# Patient Record
Sex: Female | Born: 1935 | Race: White | Hispanic: No | Smoking: Former smoker
Health system: Southern US, Community
[De-identification: ages and names within clinical notes are randomized; demographics above are authoritative.]

## PROBLEM LIST (undated history)

## (undated) ENCOUNTER — Ambulatory Visit: Attending: Hematology & Oncology | Primary: Hematology & Oncology

## (undated) ENCOUNTER — Inpatient Hospital Stay

## (undated) ENCOUNTER — Encounter: Attending: Adult Health | Primary: Adult Health

## (undated) ENCOUNTER — Ambulatory Visit

## (undated) ENCOUNTER — Telehealth: Attending: Internal Medicine | Primary: Internal Medicine

## (undated) ENCOUNTER — Telehealth

## (undated) ENCOUNTER — Encounter

## (undated) ENCOUNTER — Ambulatory Visit: Payer: MEDICARE | Attending: Physical Medicine & Rehabilitation | Primary: Physical Medicine & Rehabilitation

## (undated) ENCOUNTER — Telehealth: Attending: Adult Health | Primary: Adult Health

## (undated) ENCOUNTER — Encounter: Attending: Internal Medicine | Primary: Internal Medicine

## (undated) ENCOUNTER — Ambulatory Visit: Payer: MEDICARE

## (undated) ENCOUNTER — Ambulatory Visit: Payer: MEDICARE | Attending: Internal Medicine | Primary: Internal Medicine

## (undated) ENCOUNTER — Encounter
Attending: Student in an Organized Health Care Education/Training Program | Primary: Student in an Organized Health Care Education/Training Program

## (undated) ENCOUNTER — Institutional Professional Consult (permissible substitution): Payer: MEDICARE | Attending: Internal Medicine | Primary: Internal Medicine

## (undated) ENCOUNTER — Encounter: Attending: Oncology | Primary: Oncology

## (undated) ENCOUNTER — Ambulatory Visit: Payer: MEDICARE | Attending: Adult Health | Primary: Adult Health

## (undated) ENCOUNTER — Telehealth: Attending: Oncology | Primary: Oncology

## (undated) DIAGNOSIS — I719 Aortic aneurysm of unspecified site, without rupture: Secondary | ICD-10-CM

## (undated) DIAGNOSIS — K219 Gastro-esophageal reflux disease without esophagitis: Secondary | ICD-10-CM

## (undated) DIAGNOSIS — R2689 Other abnormalities of gait and mobility: Secondary | ICD-10-CM

## (undated) DIAGNOSIS — E785 Hyperlipidemia, unspecified: Secondary | ICD-10-CM

## (undated) DIAGNOSIS — I10 Essential (primary) hypertension: Secondary | ICD-10-CM

## (undated) DIAGNOSIS — N8110 Cystocele, unspecified: Secondary | ICD-10-CM

## (undated) DIAGNOSIS — H539 Unspecified visual disturbance: Secondary | ICD-10-CM

## (undated) DIAGNOSIS — I2510 Atherosclerotic heart disease of native coronary artery without angina pectoris: Secondary | ICD-10-CM

## (undated) DIAGNOSIS — I714 Abdominal aortic aneurysm, without rupture, unspecified: Secondary | ICD-10-CM

## (undated) DIAGNOSIS — G459 Transient cerebral ischemic attack, unspecified: Secondary | ICD-10-CM

## (undated) HISTORY — PX: DG GALL BLADDER: HXRAD326

## (undated) HISTORY — PX: EYE SURGERY: SHX253

---

## 2004-10-08 ENCOUNTER — Ambulatory Visit: Payer: Self-pay | Admitting: Family Medicine

## 2005-06-15 ENCOUNTER — Other Ambulatory Visit: Payer: Self-pay

## 2005-06-15 ENCOUNTER — Emergency Department: Payer: Self-pay | Admitting: Emergency Medicine

## 2005-07-06 ENCOUNTER — Emergency Department: Payer: Self-pay | Admitting: Emergency Medicine

## 2005-08-05 ENCOUNTER — Other Ambulatory Visit: Payer: Self-pay

## 2005-08-05 ENCOUNTER — Emergency Department: Payer: Self-pay | Admitting: Emergency Medicine

## 2005-08-10 ENCOUNTER — Ambulatory Visit: Payer: Self-pay | Admitting: Neurology

## 2005-09-09 ENCOUNTER — Ambulatory Visit: Payer: Self-pay | Admitting: Cardiology

## 2006-06-29 ENCOUNTER — Ambulatory Visit: Payer: Self-pay | Admitting: Family Medicine

## 2006-07-26 ENCOUNTER — Ambulatory Visit: Payer: Self-pay | Admitting: Family Medicine

## 2006-10-15 ENCOUNTER — Ambulatory Visit: Payer: Self-pay | Admitting: Vascular Surgery

## 2007-01-31 ENCOUNTER — Ambulatory Visit: Payer: Self-pay | Admitting: Family Medicine

## 2007-07-21 IMAGING — CT CT CHEST W/ CM
1 series · 16 of 33 positions shown, 20 images · non-contrast
Comparison: none

REASON FOR EXAM: Chest pain  pt in rm 14
COMMENTS:

[Series 3: soft tissue · axial · 0.75mm/px · z∈[+47,+357]mm · 16 of 68 slices shown, 20 images]
[im 3/68  mediastinal]
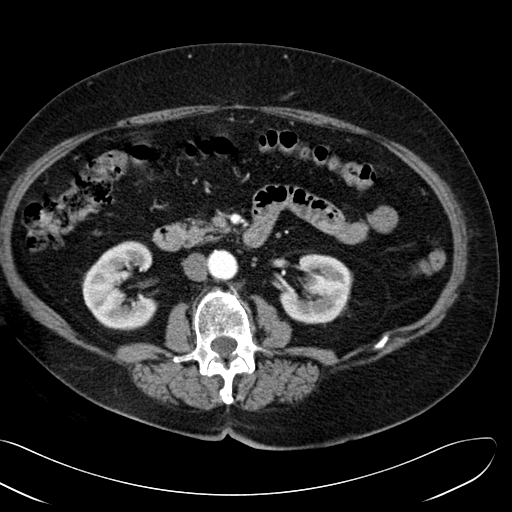
[im 3/68  lung]
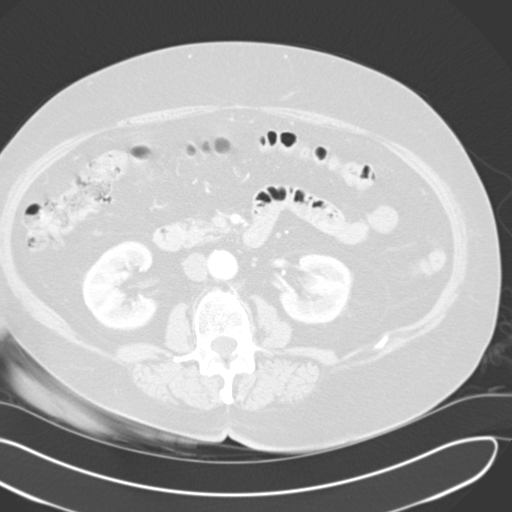
[im 8/68  lung]
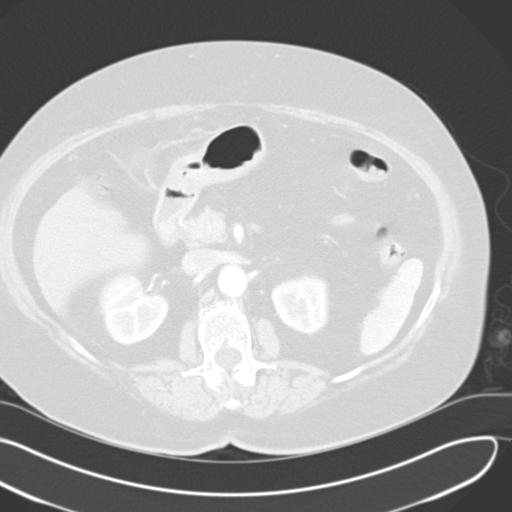
[im 13/68  lung]
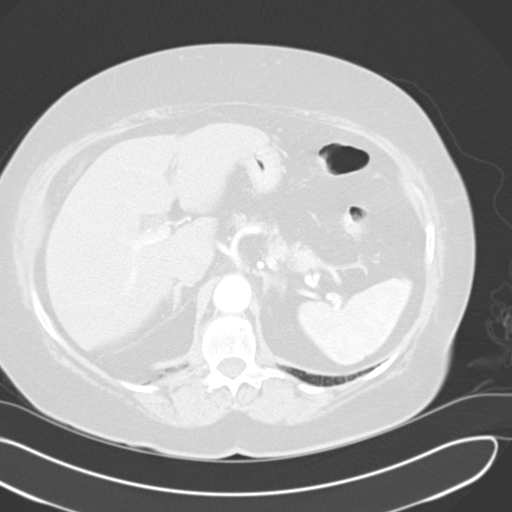
[im 15/68  lung]
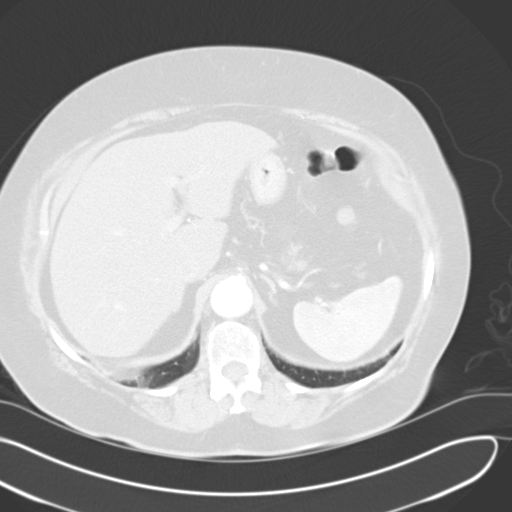
[im 20/68  mediastinal]
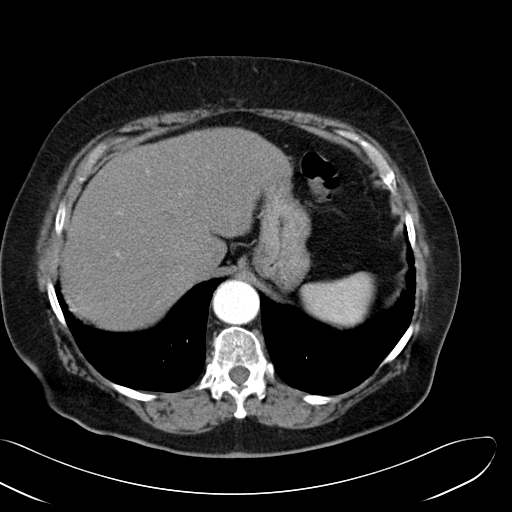
[im 20/68  lung]
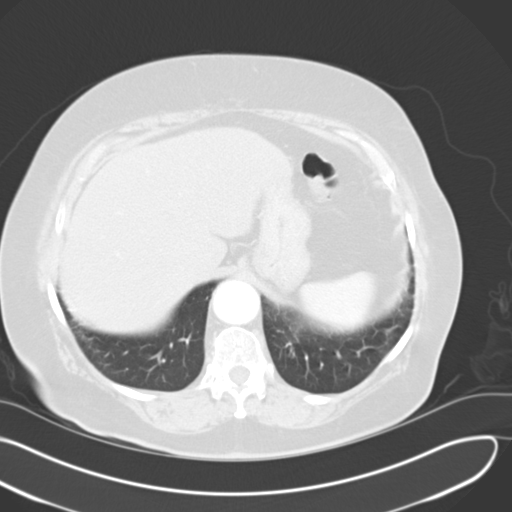
[im 25/68  lung]
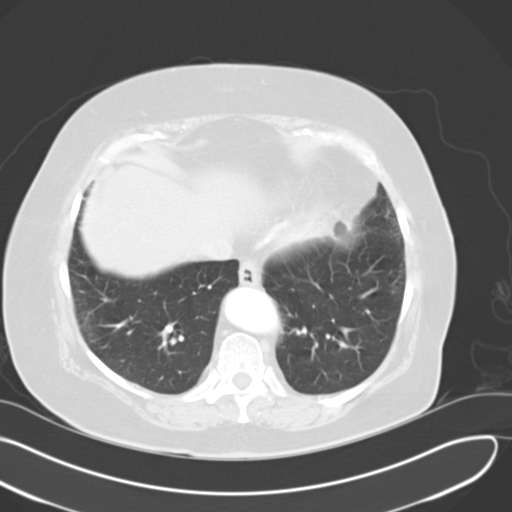
[im 28/68  lung]
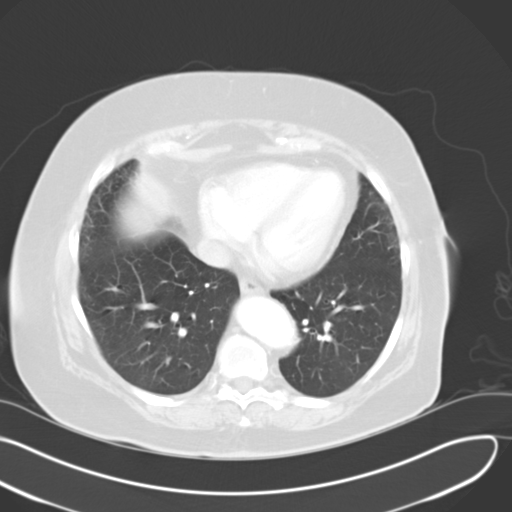
[im 33/68  lung]
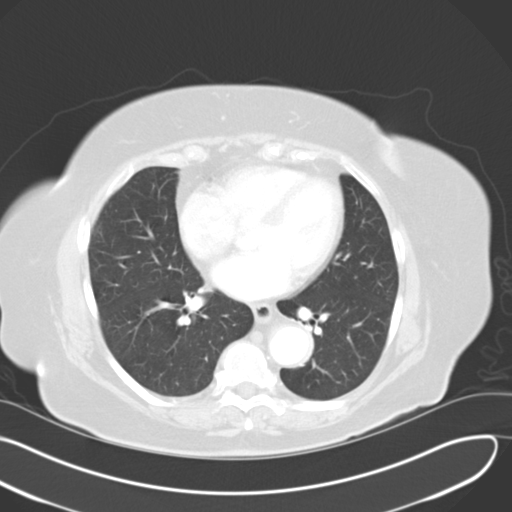
[im 36/68  mediastinal]
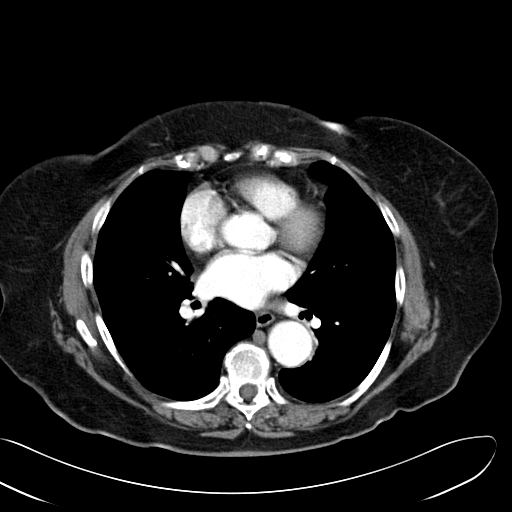
[im 36/68  lung]
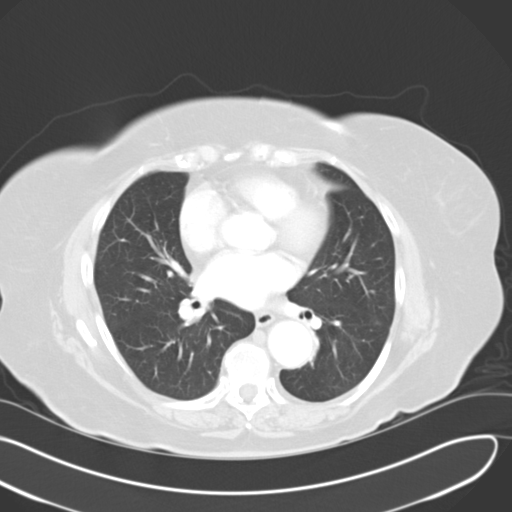
[im 40/68  lung]
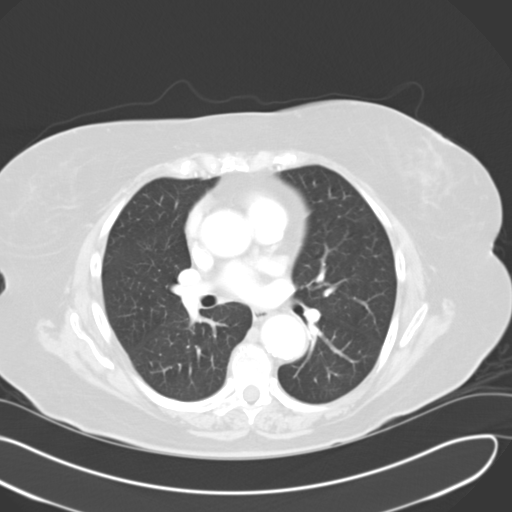
[im 43/68  lung]
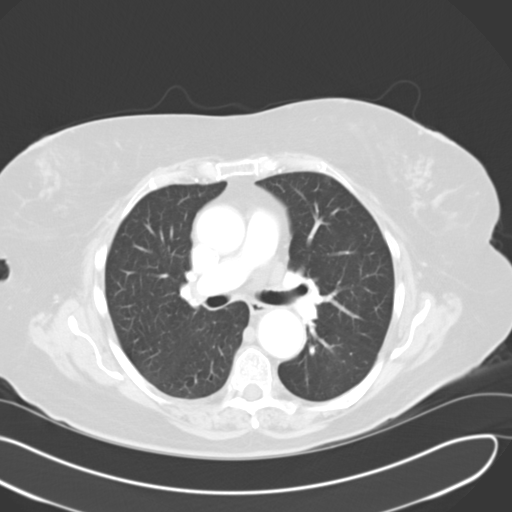
[im 48/68  lung]
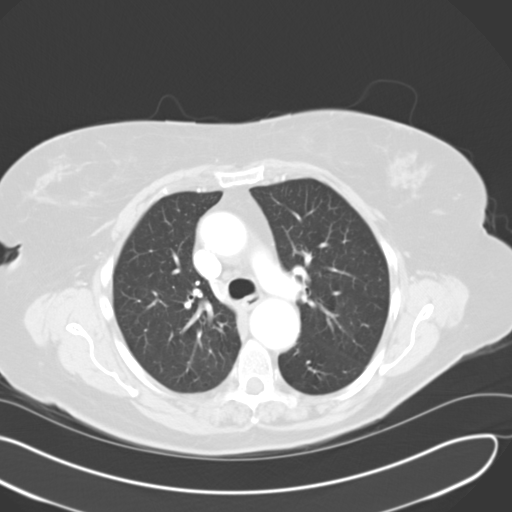
[im 53/68  mediastinal]
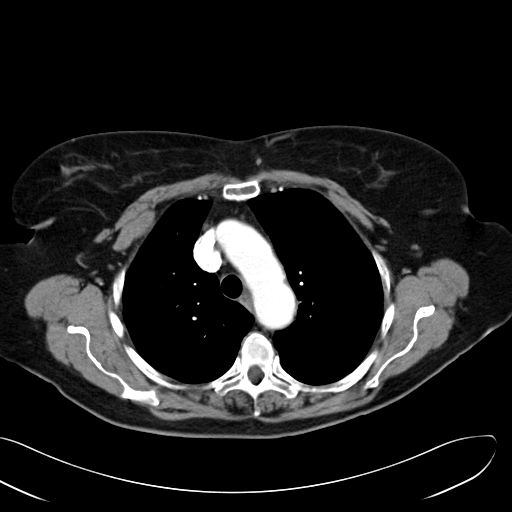
[im 53/68  lung]
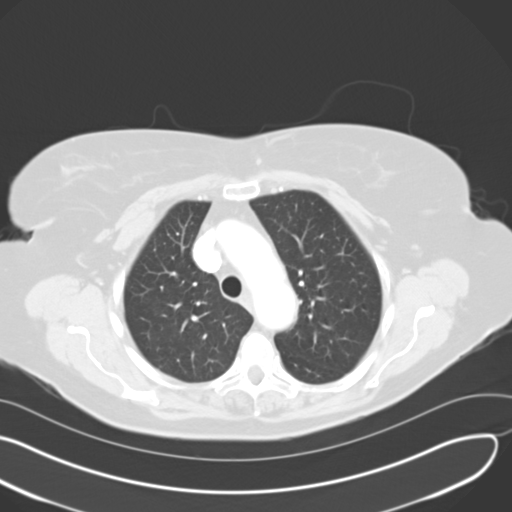
[im 55/68  lung]
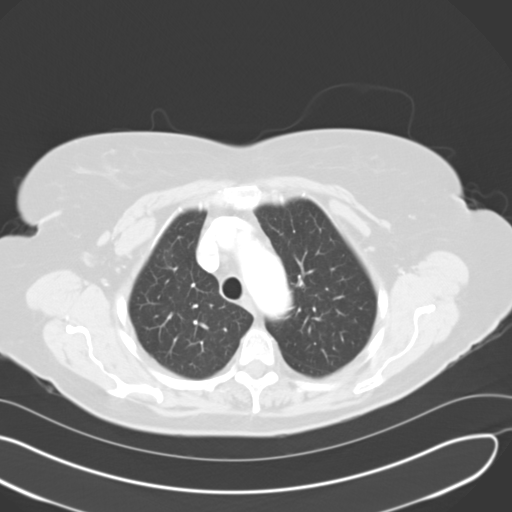
[im 60/68  lung]
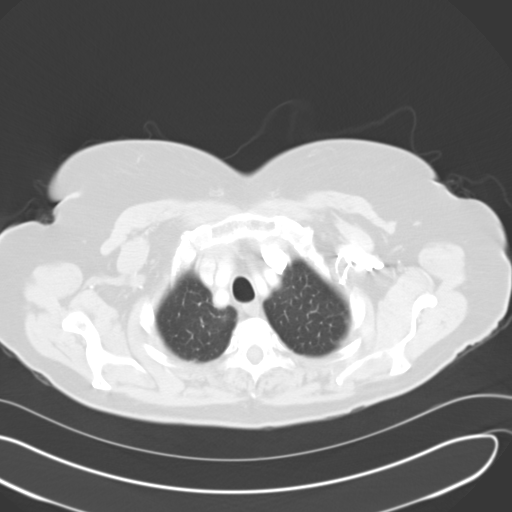
[im 65/68  lung]
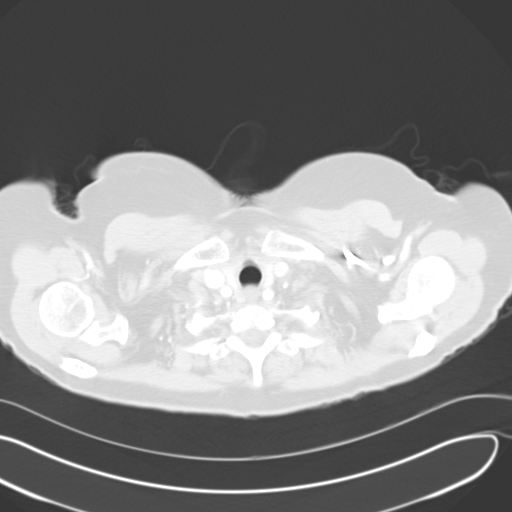

[16 of 33 positions shown; findings below may reference images not displayed]

PROCEDURE:     CT  - CT CHEST WITH CONTRAST  - June 15, 2005  [DATE]

RESULT:       IV contrast enhanced emergent CT scan of the Chest is
performed to evaluate for the possibility of pulmonary embolism.  The
patient has no prior similar Chest CT for comparison.  There is a good bolus
of contrast in the pulmonary arterial system with no filling defect to
suggest a diagnosis of pulmonary embolism.   The lungs are well inflated and
show no infiltrate.  There is very minimal RIGHT lower lobe atelectasis.
Cholecystectomy clips are present in the gallbladder fossa.  The thoracic
aorta is normal in caliber.  There is no pneumothorax or pleural effusion.
There is no CT evidence of pancreatitis.
IMPRESSION: No CT evidence of pulmonary embolism or acute infiltrate.

## 2008-02-26 ENCOUNTER — Emergency Department: Payer: Self-pay | Admitting: Internal Medicine

## 2008-05-08 ENCOUNTER — Ambulatory Visit: Payer: Self-pay | Admitting: Family Medicine

## 2008-12-12 ENCOUNTER — Ambulatory Visit: Payer: Self-pay | Admitting: Ophthalmology

## 2009-01-23 ENCOUNTER — Ambulatory Visit: Payer: Self-pay | Admitting: Ophthalmology

## 2010-09-30 ENCOUNTER — Ambulatory Visit: Payer: Self-pay | Admitting: Family Medicine

## 2012-03-21 ENCOUNTER — Ambulatory Visit: Payer: Self-pay | Admitting: Vascular Surgery

## 2012-05-10 DIAGNOSIS — E785 Hyperlipidemia, unspecified: Secondary | ICD-10-CM | POA: Insufficient documentation

## 2012-05-10 DIAGNOSIS — R079 Chest pain, unspecified: Secondary | ICD-10-CM | POA: Insufficient documentation

## 2012-05-10 DIAGNOSIS — I10 Essential (primary) hypertension: Secondary | ICD-10-CM | POA: Insufficient documentation

## 2012-05-31 ENCOUNTER — Ambulatory Visit: Payer: Self-pay | Admitting: Nurse Practitioner

## 2012-10-20 DIAGNOSIS — I716 Thoracoabdominal aortic aneurysm, without rupture, unspecified: Secondary | ICD-10-CM | POA: Insufficient documentation

## 2012-10-20 DIAGNOSIS — I77810 Thoracic aortic ectasia: Secondary | ICD-10-CM | POA: Insufficient documentation

## 2012-10-20 DIAGNOSIS — I2510 Atherosclerotic heart disease of native coronary artery without angina pectoris: Secondary | ICD-10-CM | POA: Insufficient documentation

## 2012-10-20 DIAGNOSIS — R002 Palpitations: Secondary | ICD-10-CM | POA: Insufficient documentation

## 2013-07-06 ENCOUNTER — Emergency Department: Payer: Self-pay | Admitting: Emergency Medicine

## 2014-03-25 ENCOUNTER — Emergency Department: Payer: Self-pay | Admitting: Emergency Medicine

## 2014-03-25 LAB — COMPREHENSIVE METABOLIC PANEL
ANION GAP: 6 — AB (ref 7–16)
AST: 46 U/L — AB (ref 15–37)
Albumin: 3.2 g/dL — ABNORMAL LOW (ref 3.4–5.0)
Alkaline Phosphatase: 66 U/L (ref 46–116)
BILIRUBIN TOTAL: 0.4 mg/dL (ref 0.2–1.0)
BUN: 21 mg/dL — ABNORMAL HIGH (ref 7–18)
CHLORIDE: 108 mmol/L — AB (ref 98–107)
CO2: 29 mmol/L (ref 21–32)
Calcium, Total: 8.8 mg/dL (ref 8.5–10.1)
Creatinine: 1.03 mg/dL (ref 0.60–1.30)
GFR CALC NON AF AMER: 54 — AB
Glucose: 137 mg/dL — ABNORMAL HIGH (ref 65–99)
Osmolality: 290 (ref 275–301)
Potassium: 3.6 mmol/L (ref 3.5–5.1)
SGPT (ALT): 32 U/L (ref 14–63)
Sodium: 143 mmol/L (ref 136–145)
Total Protein: 6.3 g/dL — ABNORMAL LOW (ref 6.4–8.2)

## 2014-03-25 LAB — CBC
HCT: 34.9 % — AB (ref 35.0–47.0)
HGB: 11.3 g/dL — ABNORMAL LOW (ref 12.0–16.0)
MCH: 29.7 pg (ref 26.0–34.0)
MCHC: 32.5 g/dL (ref 32.0–36.0)
MCV: 91 fL (ref 80–100)
PLATELETS: 286 10*3/uL (ref 150–440)
RBC: 3.82 10*6/uL (ref 3.80–5.20)
RDW: 13.6 % (ref 11.5–14.5)
WBC: 7.8 10*3/uL (ref 3.6–11.0)

## 2014-08-18 ENCOUNTER — Encounter: Payer: Self-pay | Admitting: Emergency Medicine

## 2014-08-18 ENCOUNTER — Emergency Department
Admission: EM | Admit: 2014-08-18 | Discharge: 2014-08-18 | Disposition: A | Discharge status: Home / Self Care | Payer: Medicare Other | Attending: Emergency Medicine | Admitting: Emergency Medicine

## 2014-08-18 ENCOUNTER — Other Ambulatory Visit: Payer: Self-pay

## 2014-08-18 DIAGNOSIS — F418 Other specified anxiety disorders: Secondary | ICD-10-CM

## 2014-08-18 DIAGNOSIS — I10 Essential (primary) hypertension: Secondary | ICD-10-CM | POA: Diagnosis present

## 2014-08-18 DIAGNOSIS — Z8673 Personal history of transient ischemic attack (TIA), and cerebral infarction without residual deficits: Secondary | ICD-10-CM | POA: Diagnosis not present

## 2014-08-18 DIAGNOSIS — Z87891 Personal history of nicotine dependence: Secondary | ICD-10-CM | POA: Insufficient documentation

## 2014-08-18 DIAGNOSIS — F419 Anxiety disorder, unspecified: Secondary | ICD-10-CM | POA: Diagnosis not present

## 2014-08-18 HISTORY — DX: Essential (primary) hypertension: I10

## 2014-08-18 HISTORY — DX: Abdominal aortic aneurysm, without rupture, unspecified: I71.40

## 2014-08-18 HISTORY — DX: Atherosclerotic heart disease of native coronary artery without angina pectoris: I25.10

## 2014-08-18 HISTORY — DX: Cystocele, unspecified: N81.10

## 2014-08-18 HISTORY — DX: Transient cerebral ischemic attack, unspecified: G45.9

## 2014-08-18 HISTORY — DX: Abdominal aortic aneurysm, without rupture: I71.4

## 2014-08-18 HISTORY — DX: Hyperlipidemia, unspecified: E78.5

## 2014-08-18 LAB — COMPREHENSIVE METABOLIC PANEL
ALK PHOS: 59 U/L (ref 38–126)
ALT: 17 U/L (ref 14–54)
AST: 24 U/L (ref 15–41)
Albumin: 4.2 g/dL (ref 3.5–5.0)
Anion gap: 11 (ref 5–15)
BUN: 24 mg/dL — AB (ref 6–20)
CALCIUM: 9.6 mg/dL (ref 8.9–10.3)
CO2: 27 mmol/L (ref 22–32)
CREATININE: 1.03 mg/dL — AB (ref 0.44–1.00)
Chloride: 102 mmol/L (ref 101–111)
GFR, EST AFRICAN AMERICAN: 57 mL/min — AB (ref >60)
GFR, EST NON AFRICAN AMERICAN: 49 mL/min — AB (ref >60)
Glucose, Bld: 101 mg/dL — ABNORMAL HIGH (ref 65–99)
POTASSIUM: 3.6 mmol/L (ref 3.5–5.1)
Sodium: 140 mmol/L (ref 135–145)
Total Bilirubin: 0.7 mg/dL (ref 0.3–1.2)
Total Protein: 7.4 g/dL (ref 6.5–8.1)

## 2014-08-18 LAB — CBC WITH DIFFERENTIAL/PLATELET
BASOS PCT: 1 %
Basophils Absolute: 0.1 10*3/uL (ref 0–0.1)
Eosinophils Absolute: 0.1 10*3/uL (ref 0–0.7)
Eosinophils Relative: 1 %
HCT: 39.5 % (ref 35.0–47.0)
Hemoglobin: 13.2 g/dL (ref 12.0–16.0)
Lymphocytes Relative: 23 %
Lymphs Abs: 2 10*3/uL (ref 1.0–3.6)
MCH: 31.1 pg (ref 26.0–34.0)
MCHC: 33.5 g/dL (ref 32.0–36.0)
MCV: 93 fL (ref 80.0–100.0)
MONO ABS: 1.1 10*3/uL — AB (ref 0.2–0.9)
MONOS PCT: 12 %
NEUTROS ABS: 5.5 10*3/uL (ref 1.4–6.5)
NEUTROS PCT: 63 %
PLATELETS: 317 10*3/uL (ref 150–440)
RBC: 4.25 MIL/uL (ref 3.80–5.20)
RDW: 14.1 % (ref 11.5–14.5)
WBC: 8.8 10*3/uL (ref 3.6–11.0)

## 2014-08-18 LAB — TROPONIN I: Troponin I: <0.03 ng/mL (ref <0.031)

## 2014-08-18 MED ORDER — CLONIDINE HCL 0.1 MG PO TABS
0.1000 mg | ORAL_TABLET | Freq: Once | ORAL | Status: AC
  Administered 2014-08-18: 0.1 mg via ORAL

## 2014-08-18 MED ORDER — CLONIDINE HCL 0.1 MG PO TABS
ORAL_TABLET | ORAL | Status: AC
  Filled 2014-08-18: qty 1

## 2014-08-18 NOTE — ED Provider Notes (Signed)
[facility] Emergency Department Provider Note  ____________________________________________  Time seen: 2145  I have reviewed the triage vital signs and the nursing notes.   HISTORY  Chief Complaint Hypertension     HPI Mary Villarreal is a 81 y.o. female with a history of hypertension, coronary disease, and some aortic aneurysms. At home today she noted that her blood pressure was higher than usual. She measured readings of 163/90. Later in the day she took her medication and the blood pressure is down but then this evening it moved back up again. The patient became concerned about this given her past medical history and presents to the emergency department. She denies any chest pain or shortness of breath. Overall she looks well and feels well although she is somewhat anxious about these blood pressure readings.  Of note, the patient has a very good memory and reports seeing me in the emergency department 10 years ago when I referred her to a physician she liked at that time.   Past Medical History  Diagnosis Date  . Aortic aneurysm, abdominal   . Hypertension   . Coronary artery disease   . TIA (transient ischemic attack)   . Bladder prolapse, female, acquired   . Hyperlipidemia     There are no active problems to display for this patient.   No past surgical history on file.  No current outpatient prescriptions on file.  Allergies Review of patient's allergies indicates no known allergies.  No family history on file.  Social History History  Substance Use Topics  . Smoking status: Former Research scientist (life sciences)  . Smokeless tobacco: Not on file  . Alcohol Use: Yes    Review of Systems  Constitutional: Negative for fever. ENT: Negative for sore throat. Cardiovascular: Hypertension. See history of present illness Respiratory: Negative for shortness of breath. Gastrointestinal: Negative for abdominal pain, vomiting and diarrhea. Genitourinary:  Negative for dysuria. Musculoskeletal: No myalgias or injuries. Skin: Negative for rash. Neurological: Negative for headaches   10-point ROS otherwise negative.  ____________________________________________   PHYSICAL EXAM:  VITAL SIGNS: ED Triage Vitals  Enc Vitals Group     BP 08/18/14 1817 132/100 mmHg     Pulse Rate 08/18/14 1817 89     Resp 08/18/14 1817 18     Temp 08/18/14 1817 98.3 F (36.8 C)     Temp Source 08/18/14 1817 Oral     SpO2 08/18/14 1817 99 %     Weight 08/18/14 1817 150 lb (68.04 kg)     Height 08/18/14 1817 5' (1.524 m)     Head Cir --      Peak Flow --      Pain Score 08/18/14 1817 0     Pain Loc --      Pain Edu? --      Excl. in [place]? --     Constitutional: Alert and oriented. Patient is a little bit agitated and anxious. She is frustrated by the weight she has had in the emergency department tonight. Now, her blood pressure is even higher at 190/110.Marland Kitchen ENT   Head: Normocephalic and atraumatic.   Nose: No congestion/rhinnorhea.   Mouth/Throat: Mucous membranes are moist. Cardiovascular: Normal rate, regular rhythm, no murmur noted Respiratory:  Normal respiratory effort, no tachypnea.    Breath sounds are clear and equal bilaterally.  Gastrointestinal: Soft and nontender. No distention.  Back: No muscle spasm, no tenderness, no CVA tenderness. Musculoskeletal: No deformity noted. Nontender with normal range of motion in all  extremities.  No noted edema. Neurologic:  Normal speech and language. No gross focal neurologic deficits are appreciated.  Skin:  Skin is warm, dry. No rash noted. Psychiatric: Other then some mild anxiety and agitation due to frustration, the patient's mood and affect are normal. Speech and behavior are normal. Intact thought process. After discussion about her blood pressure and discussing hour prior visit together, the patient becomes more relaxed and calm. ____________________________________________    LABS  (pertinent positives/negatives)  Labs Reviewed  CBC WITH DIFFERENTIAL/PLATELET - Abnormal; Notable for the following:    Monocytes Absolute 1.1 (*)    All other components within normal limits  COMPREHENSIVE METABOLIC PANEL - Abnormal; Notable for the following:    Glucose, Bld 101 (*)    BUN 24 (*)    Creatinine, Ser 1.03 (*)    GFR calc non Af Amer 49 (*)    GFR calc Af Amer 57 (*)    All other components within normal limits  TROPONIN I     ____________________________________________   EKG  ED ECG REPORT I, Luretha Eberly W, the attending physician, personally viewed and interpreted this ECG.   Date: 08/18/2014  EKG Time: 1832  Rate: 77  Rhythm: Normal sinus rhythm  Axis: Normal  Intervals: Normal  ST&T Change: None noted   ____________________________________________ ____________________________________________   INITIAL IMPRESSION / ASSESSMENT AND PLAN / ED COURSE  Pertinent labs & imaging results that were available during my care of the patient were reviewed by me and considered in my medical decision making (see chart for details).  Patient with history of hypertension with some mild to moderate elevation earlier today. This is lead to increased anxiety. In addition to that, she is clearly frustrated by longer wait and she expected in the emergency department today. With her agitated appearance not spry see her blood pressure has gone even higher. Now that she has received some reassurances and appropriate attention, she seems more calm. We will treat with clonidine 0.1, and we will reassess her blood pressure and a little bit.  ----------------------------------------- 11:26 PM on 08/18/2014 -----------------------------------------  The patient's blood pressure has eased down from the notable high levels earlier to 142/92 and is now at 124/78. The patient feels comfortable and feels ready for discharge. We have discussed her medications. I've asked her to  continue her medications as prescribed. Give her blood pressure accelerate's she could take a Hyzaar twice a day instead of once a day tomorrow. I have stressed that she needs to follow with her primary physician for further review of her blood pressure medications and ongoing treatment.  ____________________________________________   FINAL CLINICAL IMPRESSION(S) / ED DIAGNOSES  Final diagnoses:  Essential hypertension  Anxiety about health      Ahmed Prima, MD 08/18/14 2329

## 2014-08-18 NOTE — ED Notes (Signed)
Pt alert and in NAD at time of d/c 

## 2014-08-18 NOTE — ED Notes (Signed)
Pt states she checked her BP today at home and it was 168/90, states she had coronary artery disease, and abd/aortic aneurysm and she was concerned it was that high, states she took her BP medication today as scheduled, denies any cp or sob, states she does have some tightness and distention to upper abd area

## 2014-08-18 NOTE — Discharge Instructions (Signed)

## 2014-09-14 ENCOUNTER — Ambulatory Visit: Payer: Self-pay | Admitting: Urology

## 2015-10-02 DIAGNOSIS — L574 Cutis laxa senilis: Secondary | ICD-10-CM | POA: Insufficient documentation

## 2016-07-30 ENCOUNTER — Ambulatory Visit
Admission: EM | Admit: 2016-07-30 | Discharge: 2016-07-30 | Disposition: A | Discharge status: Home / Self Care | Payer: Medicare Other | Attending: Family Medicine | Admitting: Family Medicine

## 2016-07-30 ENCOUNTER — Ambulatory Visit: Payer: Medicare Other

## 2016-07-30 DIAGNOSIS — Z7982 Long term (current) use of aspirin: Secondary | ICD-10-CM | POA: Insufficient documentation

## 2016-07-30 DIAGNOSIS — M25421 Effusion, right elbow: Secondary | ICD-10-CM | POA: Diagnosis present

## 2016-07-30 DIAGNOSIS — Z79899 Other long term (current) drug therapy: Secondary | ICD-10-CM | POA: Diagnosis not present

## 2016-07-30 DIAGNOSIS — I2510 Atherosclerotic heart disease of native coronary artery without angina pectoris: Secondary | ICD-10-CM | POA: Insufficient documentation

## 2016-07-30 DIAGNOSIS — E785 Hyperlipidemia, unspecified: Secondary | ICD-10-CM | POA: Diagnosis not present

## 2016-07-30 DIAGNOSIS — Z87891 Personal history of nicotine dependence: Secondary | ICD-10-CM | POA: Insufficient documentation

## 2016-07-30 DIAGNOSIS — I714 Abdominal aortic aneurysm, without rupture: Secondary | ICD-10-CM | POA: Diagnosis not present

## 2016-07-30 DIAGNOSIS — Z8673 Personal history of transient ischemic attack (TIA), and cerebral infarction without residual deficits: Secondary | ICD-10-CM | POA: Insufficient documentation

## 2016-07-30 DIAGNOSIS — L98499 Non-pressure chronic ulcer of skin of other sites with unspecified severity: Secondary | ICD-10-CM | POA: Diagnosis not present

## 2016-07-30 DIAGNOSIS — M7021 Olecranon bursitis, right elbow: Secondary | ICD-10-CM | POA: Insufficient documentation

## 2016-07-30 DIAGNOSIS — I10 Essential (primary) hypertension: Secondary | ICD-10-CM | POA: Insufficient documentation

## 2016-07-30 LAB — SYNOVIAL CELL COUNT + DIFF, W/ CRYSTALS
Crystals, Fluid: NONE SEEN
EOSINOPHILS-SYNOVIAL: 14 %
LYMPHOCYTES-SYNOVIAL FLD: 14 %
MONOCYTE-MACROPHAGE-SYNOVIAL FLUID: 1 %
NEUTROPHIL, SYNOVIAL: 71 %
OTHER CELLS-SYN: 0
WBC, SYNOVIAL: 301 /mm3 — AB (ref 0–200)

## 2016-07-30 MED ORDER — LIDOCAINE HCL (PF) 1 % IJ SOLN
2.0000 mL | Freq: Once | INTRAMUSCULAR | Status: AC
  Administered 2016-07-30: 2 mL via INTRADERMAL

## 2016-07-30 MED ORDER — DEXAMETHASONE SODIUM PHOSPHATE 10 MG/ML IJ SOLN
10.0000 mg | Freq: Once | INTRAMUSCULAR | Status: AC
  Administered 2016-07-30: 10 mg via INTRA_ARTICULAR

## 2016-07-30 MED ORDER — MUPIROCIN 2 % EX OINT: 1.0000 "application " | TOPICAL_OINTMENT | Freq: Two times a day (BID) | CUTANEOUS | 0 refills | Status: DC

## 2016-07-30 NOTE — ED Provider Notes (Signed)
[facility]    CSN: [id number] Arrival date & time: 07/30/16  1051     History   Chief Complaint Chief Complaint  Patient presents with  . Joint Swelling    right elbow    HPI Mary Villarreal is a 83 y.o. female.   She is here because of multiple issues. She is 83 year old white female who started noticing swelling in her right elbow. She's been retraining himself on the piano she denies any trauma to her elbow while playing but has swelling in the right elbow. She looked it up online and saw the concern that this could possibly be an olecranon bursitis. This is gotten bigger over the last week and no swollen and is causing pain and discomfort. She also has a lesion behind her right ear she states is been anabiotic ointment she does not know the name that she's been using she does admit that the eyeglass on that this behind the ear to hold her glasses has been continued to irritate the area. She is try to put gauze over the lesion area in the right ear but the gauze will not stay and his falls off. She states she has appointment with her PCP in 1-2 weeks to have this reevaluated. Should be noted that she denies any trauma to the right elbow. Past medical history she has aneurysm in her chest and thoracic area that's she's being followed with at [place] she sees cardiologist she's also has coronary artery disease this extensive she has history hyperlipidemia hypertension and TIAs as well she has a bladder prolapse and she has what sounds like scoliosis as well. She has informed me that aging is not a good thing sometimes times.      Past Medical History:  Diagnosis Date  . Aortic aneurysm, abdominal ([place])   . Bladder prolapse, female, acquired   . Coronary artery disease   . Hyperlipidemia   . Hypertension   . TIA (transient ischemic attack)     There are no active problems to display for this patient.   History reviewed. No pertinent surgical history.  OB History      No data available       Home Medications    Prior to Admission medications   Medication Sig Start Date End Date Taking? Authorizing Provider  aspirin 81 MG chewable tablet Chew by mouth daily.   Yes [provider]  betamethasone valerate lotion (VALISONE) 0.1 % Apply 1 application topically 2 (two) times daily.   Yes [provider]  CALCIUM ASCORBATE PO Take by mouth.   Yes [provider]  CALCIUM PO Take by mouth.   Yes [provider]  diltiazem (CARDIZEM CD) 240 MG 24 hr capsule Take 240 mg by mouth daily.   Yes [provider]  DOCUSATE CALCIUM PO Take by mouth.   Yes [provider]  hydrocortisone 2.5 % cream Apply topically 2 (two) times daily.   Yes [provider]  losartan-hydrochlorothiazide (HYZAAR) 50-12.5 MG tablet Take 1 tablet by mouth daily.   Yes [provider]  metroNIDAZOLE (METROGEL) 1 % gel Apply topically daily.   Yes [provider]  Multiple Vitamins-Minerals (MULTIVITAMIN ADULT PO) Take by mouth.   Yes [provider]  naproxen sodium (ANAPROX) 220 MG tablet Take 220 mg by mouth 2 (two) times daily with a meal.   Yes [provider]  simvastatin (ZOCOR) 20 MG tablet Take 20 mg by mouth daily.   Yes  [provider]  VITAMIN D, ERGOCALCIFEROL, PO Take by mouth.   Yes [provider]    Family History History reviewed. No pertinent family history.  Social History Social History  Substance Use Topics  . Smoking status: Former Research scientist (life sciences)  . Smokeless tobacco: Never Used  . Alcohol use Yes     Allergies   Patient has no known allergies.   Review of Systems Review of Systems  HENT: Positive for ear pain.   Musculoskeletal: Positive for joint swelling and myalgias.  All other systems reviewed and are negative.    Physical Exam Triage Vital Signs ED Triage Vitals  Enc Vitals Group     BP 07/30/16 1112 (!) 157/75     Pulse Rate  07/30/16 1112 79     Resp 07/30/16 1112 18     Temp 07/30/16 1112 98 F (36.7 C)     Temp Source 07/30/16 1112 Oral     SpO2 07/30/16 1112 98 %     Weight 07/30/16 1110 140 lb (63.5 kg)     Height 07/30/16 1110 4\' 9"  (1.448 m)     Head Circumference --      Peak Flow --      Pain Score 07/30/16 1110 2     Pain Loc --      Pain Edu? --      Excl. in [place]? --    No data found.   Updated Vital Signs BP (!) 157/75 (BP Location: Left Arm)   Pulse 79   Temp 98 F (36.7 C) (Oral)   Resp 18   Ht 4\' 9"  (1.448 m)   Wt 140 lb (63.5 kg)   SpO2 98%   BMI 30.30 kg/m   Visual Acuity Right Eye Distance:   Left Eye Distance:   Bilateral Distance:    Right Eye Near:   Left Eye Near:    Bilateral Near:     Physical Exam  Constitutional: She is oriented to person, place, and time. She is active and cooperative.  Non-toxic appearance. She does not have a sickly appearance. She does not appear ill. No distress.  Elderly white female no acute distress  HENT:  Head: Normocephalic and atraumatic.    She has an ulceration just above the right earlobe area is red and inflamed  Eyes: Pupils are equal, round, and reactive to light.  Neck: Normal range of motion.  Cardiovascular: Normal rate, regular rhythm and normal heart sounds.   Musculoskeletal: She exhibits tenderness. She exhibits no edema or deformity.       Right elbow: She exhibits swelling. She exhibits normal range of motion. Tenderness found.  Patient has what appears be a very prominent right olecranon bursitis versus inflamed  Neurological: She is alert and oriented to person, place, and time. No cranial nerve deficit.  Skin: Skin is warm.  Psychiatric: She has a normal mood and affect.  Vitals reviewed.    UC Treatments / Results  Labs (all labs ordered are listed, but only abnormal results are displayed) Labs Reviewed - No data to display  EKG  EKG Interpretation None       Radiology No results  found.  Procedures .Joint Aspiration/Arthrocentesis Date/Time: 07/30/2016 1:05 PM Performed by: Frederich Cha Authorized by: Frederich Cha   Consent:    Consent obtained:  Verbal   Consent given by:  Patient Location:    Location:  Elbow Procedure details:    Preparation: Patient was prepped and draped in usual sterile fashion  Needle gauge:  18 G   Ultrasound guidance: no     Approach:  Anterior   Aspirate characteristics:  Bloody   Steroid injected: yes     Specimen collected: yes   Post-procedure details:    Dressing:  Sterile dressing   Patient tolerance of procedure:  Tolerated well, no immediate complications Comments:     Patient tolerated procedure quite well. 3 ML's was injected back into the bursa with the needle stabilize using hemostat. The 3 mL's consists of 2 mL's of Decadron and 1 mL of Xylocaine. Elbow was wrapped with patient instructed to leave been shown for at least 24 hours.   (including critical care time)  Medications Ordered in UC Medications - No data to display   Initial Impression / Assessment and Plan / UC Course  I have reviewed the triage vital signs and the nursing notes.  Pertinent labs & imaging results that were available during my care of the patient were reviewed by me and considered in my medical decision making (see chart for details).   will plan to x-ray the right elbow    Final Clinical Impressions(s) / UC Diagnoses   Final diagnoses:  None    New Prescriptions New Prescriptions   No medications on file     Frederich Cha, MD 07/30/16 1308

## 2016-07-30 NOTE — ED Triage Notes (Signed)
Pt c/o joint swelling in her right elbow that she noticed 2 days ago, she says there is heat in it. There is minimal pain in the joint. She also mentions a lesion behind her right ear that is being treated by her PCP but would like for someone to look at it.

## 2016-09-28 DIAGNOSIS — R350 Frequency of micturition: Secondary | ICD-10-CM | POA: Insufficient documentation

## 2017-01-31 ENCOUNTER — Other Ambulatory Visit: Payer: Self-pay

## 2017-01-31 ENCOUNTER — Encounter: Payer: Self-pay | Admitting: Emergency Medicine

## 2017-01-31 ENCOUNTER — Emergency Department
Admission: EM | Admit: 2017-01-31 | Discharge: 2017-01-31 | Disposition: A | Discharge status: Home / Self Care | Payer: Medicare Other | Attending: Emergency Medicine | Admitting: Emergency Medicine

## 2017-01-31 ENCOUNTER — Emergency Department: Payer: Medicare Other

## 2017-01-31 DIAGNOSIS — Z79899 Other long term (current) drug therapy: Secondary | ICD-10-CM | POA: Insufficient documentation

## 2017-01-31 DIAGNOSIS — I10 Essential (primary) hypertension: Secondary | ICD-10-CM | POA: Insufficient documentation

## 2017-01-31 DIAGNOSIS — I2510 Atherosclerotic heart disease of native coronary artery without angina pectoris: Secondary | ICD-10-CM | POA: Insufficient documentation

## 2017-01-31 DIAGNOSIS — Z7982 Long term (current) use of aspirin: Secondary | ICD-10-CM | POA: Insufficient documentation

## 2017-01-31 DIAGNOSIS — Z8673 Personal history of transient ischemic attack (TIA), and cerebral infarction without residual deficits: Secondary | ICD-10-CM | POA: Diagnosis not present

## 2017-01-31 DIAGNOSIS — Z87891 Personal history of nicotine dependence: Secondary | ICD-10-CM | POA: Diagnosis not present

## 2017-01-31 DIAGNOSIS — R202 Paresthesia of skin: Secondary | ICD-10-CM | POA: Diagnosis present

## 2017-01-31 DIAGNOSIS — M5412 Radiculopathy, cervical region: Secondary | ICD-10-CM | POA: Diagnosis not present

## 2017-01-31 LAB — COMPREHENSIVE METABOLIC PANEL
ALK PHOS: 64 U/L (ref 38–126)
ALT: 15 U/L (ref 14–54)
AST: 28 U/L (ref 15–41)
Albumin: 3.8 g/dL (ref 3.5–5.0)
Anion gap: 7 (ref 5–15)
BUN: 14 mg/dL (ref 6–20)
CALCIUM: 9.1 mg/dL (ref 8.9–10.3)
CHLORIDE: 105 mmol/L (ref 101–111)
CO2: 25 mmol/L (ref 22–32)
CREATININE: 0.96 mg/dL (ref 0.44–1.00)
GFR calc Af Amer: >60 mL/min (ref >60)
GFR, EST NON AFRICAN AMERICAN: 52 mL/min — AB (ref >60)
Glucose, Bld: 108 mg/dL — ABNORMAL HIGH (ref 65–99)
Potassium: 4 mmol/L (ref 3.5–5.1)
Sodium: 137 mmol/L (ref 135–145)
Total Bilirubin: 0.6 mg/dL (ref 0.3–1.2)
Total Protein: 6.7 g/dL (ref 6.5–8.1)

## 2017-01-31 LAB — CBC
HEMATOCRIT: 34.8 % — AB (ref 35.0–47.0)
Hemoglobin: 11.8 g/dL — ABNORMAL LOW (ref 12.0–16.0)
MCH: 31.3 pg (ref 26.0–34.0)
MCHC: 34 g/dL (ref 32.0–36.0)
MCV: 92.1 fL (ref 80.0–100.0)
Platelets: 795 10*3/uL — ABNORMAL HIGH (ref 150–440)
RBC: 3.77 MIL/uL — ABNORMAL LOW (ref 3.80–5.20)
RDW: 15 % — AB (ref 11.5–14.5)
WBC: 6.4 10*3/uL (ref 3.6–11.0)

## 2017-01-31 LAB — TROPONIN I

## 2017-01-31 MED ORDER — PREDNISONE 20 MG PO TABS
40.0000 mg | ORAL_TABLET | Freq: Once | ORAL | Status: AC
Start: 2017-01-31 — End: 2017-01-31
  Administered 2017-01-31: 40 mg via ORAL
  Filled 2017-01-31: qty 2

## 2017-01-31 MED ORDER — PREDNISONE 20 MG PO TABS: 40.0000 mg | ORAL_TABLET | Freq: Every day | ORAL | 0 refills | Status: DC

## 2017-01-31 NOTE — ED Triage Notes (Signed)
Patient ambulatory to triage with complaints of waking up this am with tingling in both arms.  Pt denies pain but reports hx of HTN.   Pt reports that for the last week pt has had chills running her back and has attributed this to the DDD.  Pt denies N/V/D/HA/chills/sweating/fever. Pt reports taking regular before coming to ED.  Speaking in complete coherent sentences. No acute breathing distress noted.

## 2017-01-31 NOTE — ED Notes (Signed)
ED Provider at bedside. 

## 2017-01-31 NOTE — ED Notes (Addendum)
Pt c/o tingling "chills" in her arms and back for 2 days; worsening; says her symptoms feel worse after being out in the cold; denies pain

## 2017-01-31 NOTE — ED Provider Notes (Signed)
[facility] Emergency Department Provider Note   ____________________________________________    I have reviewed the triage vital signs and the nursing notes.   HISTORY  Chief Complaint Tingling     HPI Mary Villarreal is a 83 y.o. female who presents with complaints of a tingling sensation across her upper back and into her arms but does not involve the hands.  Symptoms have been occurring over the last week but have worsened to the arms overnight which made her concerned.  No headaches.  No chest pain.  No weakness.  She has had this several times over the last 10 years.  She reports she has been very stressed over the last 2 weeks because of a performance and has been playing a lot of piano with her neck at an awkward angle.  She has not taken anything for this.  No fevers or chills or shortness of breath.  Past Medical History:  Diagnosis Date  . Aortic aneurysm, abdominal ([place])   . Bladder prolapse, female, acquired   . Coronary artery disease   . Hyperlipidemia   . Hypertension   . TIA (transient ischemic attack)     There are no active problems to display for this patient.   History reviewed. No pertinent surgical history.  Prior to Admission medications   Medication Sig Start Date End Date Taking? Authorizing Provider  aspirin 81 MG chewable tablet Chew by mouth daily.    [provider]  betamethasone valerate lotion (VALISONE) 0.1 % Apply 1 application topically 2 (two) times daily.    [provider]  CALCIUM ASCORBATE PO Take by mouth.    [provider]  CALCIUM PO Take by mouth.    [provider]  diltiazem (CARDIZEM CD) 240 MG 24 hr capsule Take 240 mg by mouth daily.    [provider]  DOCUSATE CALCIUM PO Take by mouth.    [provider]  hydrocortisone 2.5 % cream Apply topically 2 (two) times daily.    [provider]  losartan-hydrochlorothiazide (HYZAAR)  50-12.5 MG tablet Take 1 tablet by mouth daily.    [provider]  metroNIDAZOLE (METROGEL) 1 % gel Apply topically daily.    [provider]  Multiple Vitamins-Minerals (MULTIVITAMIN ADULT PO) Take by mouth.    [provider]  mupirocin ointment (BACTROBAN) 2 % Apply 1 application topically 2 (two) times daily. Behind R ear 07/30/16   Frederich Cha, MD  naproxen sodium (ANAPROX) 220 MG tablet Take 220 mg by mouth 2 (two) times daily with a meal.    [provider]  predniSONE (DELTASONE) 20 MG tablet Take 2 tablets (40 mg total) by mouth daily with breakfast. 01/31/17   Lavonia Drafts, MD  simvastatin (ZOCOR) 20 MG tablet Take 20 mg by mouth daily.    [provider]  VITAMIN D, ERGOCALCIFEROL, PO Take by mouth.    [provider]     Allergies Patient has no known allergies.  History reviewed. No pertinent family history.  Social History Social History   Tobacco Use  . Smoking status: Former Research scientist (life sciences)  . Smokeless tobacco: Never Used  Substance Use Topics  . Alcohol use: Yes    Alcohol/week: 1.2 oz    Types: 2 Glasses of wine per week  . Drug use: No    Review of Systems  Constitutional: No fever/chills Eyes: No visual changes.  ENT: No neck pain Cardiovascular: Denies chest pain. Respiratory: Denies shortness of breath.  Gastrointestinal: No abdominal pain.  No nausea, no vomiting.   Genitourinary: Negative for dysuria. Musculoskeletal: Negative for back pain. Skin: Negative for rash. Neurological: Negative for headaches or focal weakness, tingling as above   ____________________________________________   PHYSICAL EXAM:  VITAL SIGNS: ED Triage Vitals  Enc Vitals Group     BP 01/31/17 0648 (!) 158/82     Pulse Rate 01/31/17 0648 69     Resp 01/31/17 0648 15     Temp 01/31/17 0648 98 F (36.7 C)     Temp Source 01/31/17 0648 Oral     SpO2 01/31/17 0648 99 %     Weight 01/31/17 0649 63.5 kg (140 lb)     Height  01/31/17 0649 1.448 m (4\' 9" )     Head Circumference --      Peak Flow --      Pain Score --      Pain Loc --      Pain Edu? --      Excl. in [place]? --     Constitutional: Alert and oriented. No acute distress. Pleasant and interactive Eyes: Conjunctivae are normal.  Head: Atraumatic. Nose: No congestion/rhinnorhea. Mouth/Throat: Mucous membranes are moist.   Neck:  Painless ROM Cardiovascular: Normal rate, regular rhythm. Grossly normal heart sounds.  Good peripheral circulation. Respiratory: Normal respiratory effort.  No retractions. Lungs CTAB. Gastrointestinal: Soft and nontender. No distention.  No CVA tenderness. Genitourinary: deferred Musculoskeletal: No lower extremity tenderness nor edema.  Warm and well perfused Neurologic:  Normal speech and language. No gross focal neurologic deficits are appreciated.  Cranial nerves II through XII are normal Skin:  Skin is warm, dry and intact. No rash noted. Psychiatric: Mood and affect are normal. Speech and behavior are normal.  ____________________________________________   LABS (all labs ordered are listed, but only abnormal results are displayed)  Labs Reviewed  COMPREHENSIVE METABOLIC PANEL - Abnormal; Notable for the following components:      Result Value   Glucose, Bld 108 (*)    GFR calc non Af Amer 52 (*)    All other components within normal limits  CBC - Abnormal; Notable for the following components:   RBC 3.77 (*)    Hemoglobin 11.8 (*)    HCT 34.8 (*)    RDW 15.0 (*)    Platelets 795 (*)    All other components within normal limits  TROPONIN I   ____________________________________________  EKG  ED ECG REPORT I, Lavonia Drafts, the attending physician, personally viewed and interpreted this ECG.  Date: 01/31/2017  Rhythm: normal sinus rhythm QRS Axis: normal Intervals: normal ST/T Wave abnormalities: normal Narrative Interpretation: no evidence of acute  ischemia  ____________________________________________  RADIOLOGY  CT head and cervical spine ____________________________________________   PROCEDURES  Procedure(s) performed: No  Procedures   Critical Care performed: No ____________________________________________   INITIAL IMPRESSION / ASSESSMENT AND PLAN / ED COURSE  Pertinent labs & imaging results that were available during my care of the patient were reviewed by me and considered in my medical decision making (see chart for details).  Patient's description appears most consistent with radiculopathy.  Differential includes cervical radiculopathy, degenerative disc disease, spinal stenosis.  Not consistent with ACS nor CVA  Will image the head and cervical spine obtain labs to evaluate for electrolyte abnormalities and reassess.  ----------------------------------------- 8:52 AM on 01/31/2017 -----------------------------------------  Lab work unremarkable.  I suspect cervical radiculopathy, will treat with brief course of steroids and have the patient follow-up with neurosurgery, return  precautions including muscle weakness discussed    ____________________________________________   FINAL CLINICAL IMPRESSION(S) / ED DIAGNOSES  Final diagnoses:  Cervical radiculopathy        Note:  This document was prepared using Dragon voice recognition software and may include unintentional dictation errors.    Lavonia Drafts, MD 01/31/17 [phone]

## 2017-06-28 ENCOUNTER — Encounter: Payer: Self-pay | Admitting: Emergency Medicine

## 2017-06-28 ENCOUNTER — Emergency Department: Payer: Medicare Other

## 2017-06-28 ENCOUNTER — Emergency Department
Admission: EM | Admit: 2017-06-28 | Discharge: 2017-06-28 | Disposition: A | Discharge status: Home / Self Care | Payer: Medicare Other | Attending: Emergency Medicine | Admitting: Emergency Medicine

## 2017-06-28 DIAGNOSIS — Z79899 Other long term (current) drug therapy: Secondary | ICD-10-CM | POA: Insufficient documentation

## 2017-06-28 DIAGNOSIS — Z8673 Personal history of transient ischemic attack (TIA), and cerebral infarction without residual deficits: Secondary | ICD-10-CM | POA: Diagnosis not present

## 2017-06-28 DIAGNOSIS — Z7982 Long term (current) use of aspirin: Secondary | ICD-10-CM | POA: Insufficient documentation

## 2017-06-28 DIAGNOSIS — I2510 Atherosclerotic heart disease of native coronary artery without angina pectoris: Secondary | ICD-10-CM | POA: Insufficient documentation

## 2017-06-28 DIAGNOSIS — I10 Essential (primary) hypertension: Secondary | ICD-10-CM | POA: Diagnosis not present

## 2017-06-28 DIAGNOSIS — M79604 Pain in right leg: Secondary | ICD-10-CM | POA: Insufficient documentation

## 2017-06-28 DIAGNOSIS — Z87891 Personal history of nicotine dependence: Secondary | ICD-10-CM | POA: Insufficient documentation

## 2017-06-28 LAB — COMPREHENSIVE METABOLIC PANEL
ALBUMIN: 4.2 g/dL (ref 3.5–5.0)
ALT: 13 U/L — ABNORMAL LOW (ref 14–54)
ANION GAP: 10 (ref 5–15)
AST: 27 U/L (ref 15–41)
Alkaline Phosphatase: 64 U/L (ref 38–126)
BUN: 17 mg/dL (ref 6–20)
CALCIUM: 9.4 mg/dL (ref 8.9–10.3)
CHLORIDE: 103 mmol/L (ref 101–111)
CO2: 26 mmol/L (ref 22–32)
Creatinine, Ser: 0.91 mg/dL (ref 0.44–1.00)
GFR calc non Af Amer: 56 mL/min — ABNORMAL LOW (ref >60)
GLUCOSE: 99 mg/dL (ref 65–99)
POTASSIUM: 3.9 mmol/L (ref 3.5–5.1)
SODIUM: 139 mmol/L (ref 135–145)
Total Bilirubin: 0.6 mg/dL (ref 0.3–1.2)
Total Protein: 7.4 g/dL (ref 6.5–8.1)

## 2017-06-28 LAB — CBC WITH DIFFERENTIAL/PLATELET
BASOS PCT: 1 %
Basophils Absolute: 0.1 10*3/uL (ref 0–0.1)
EOS ABS: 0.2 10*3/uL (ref 0–0.7)
EOS PCT: 3 %
HCT: 34.6 % — ABNORMAL LOW (ref 35.0–47.0)
HEMOGLOBIN: 11.4 g/dL — AB (ref 12.0–16.0)
LYMPHS ABS: 1.1 10*3/uL (ref 1.0–3.6)
Lymphocytes Relative: 16 %
MCH: 29 pg (ref 26.0–34.0)
MCHC: 32.9 g/dL (ref 32.0–36.0)
MCV: 88.1 fL (ref 80.0–100.0)
MONOS PCT: 14 %
Monocytes Absolute: 1 10*3/uL — ABNORMAL HIGH (ref 0.2–0.9)
NEUTROS PCT: 66 %
Neutro Abs: 4.7 10*3/uL (ref 1.4–6.5)
PLATELETS: 896 10*3/uL — AB (ref 150–440)
RBC: 3.92 MIL/uL (ref 3.80–5.20)
RDW: 17.1 % — AB (ref 11.5–14.5)
WBC: 7.2 10*3/uL (ref 3.6–11.0)

## 2017-06-28 LAB — MAGNESIUM: Magnesium: 1.8 mg/dL (ref 1.7–2.4)

## 2017-06-28 NOTE — Discharge Instructions (Addendum)
Follow-up with your primary care provider if any continued problems.  Today's lab work and ultrasound did not show any life-threatening cause for your leg to be hurting.

## 2017-06-28 NOTE — ED Notes (Signed)
See triage note  Presents with pain to right posterior calf for couple of days ..states pain is worse at night     Describes as  "cramping"  Was feeling better yesterday    Then last pm states pain is now radiating into anterior lower leg  No redness or swelling noted   Ambulates well to treatment room

## 2017-06-28 NOTE — ED Triage Notes (Signed)
Pt reports a week ago she was planting flowers in her front yard and she was carrying heavy things and slipped a few times but did not fall. Pt reports now has some cramps in her right lower leg at night only. Pt has a small bruise in her right lower leg, no swelling noted, no redness noted and leg temperature is equal bilaterally. Pt reports pain is only at night and none during the day.

## 2017-06-28 NOTE — ED Provider Notes (Addendum)
[facility] Emergency Department Provider Note   ____________________________________________   First MD Initiated Contact with Patient 06/28/17 [phone]     (approximate)  I have reviewed the triage vital signs and the nursing notes.   HISTORY  Chief Complaint Leg Pain   HPI Mary Villarreal is a 84 y.o. female is here with complaint of right lower leg pain that occurs only at night.  Patient states that she was planting flowers in her front yard 1 week ago and states that she slipped a couple times but did not fall.  Patient relates that she was down on her knees while planning her flowers.  Last night her cramp in her right leg was worse and she became worried that she had a blood clot.  She denies any previous DVT.  There is been no other injury to her leg and she is not been on any long trips.  Patient was a former smoker and discontinued smoking 20 years ago.  Currently she rates her pain as a 2/10.  Past Medical History:  Diagnosis Date  . Aortic aneurysm, abdominal ([place])   . Bladder prolapse, female, acquired   . Coronary artery disease   . Hyperlipidemia   . Hypertension   . TIA (transient ischemic attack)     There are no active problems to display for this patient.   History reviewed. No pertinent surgical history.  Prior to Admission medications   Medication Sig Start Date End Date Taking? Authorizing Provider  aspirin 81 MG chewable tablet Chew by mouth daily.    [provider]  betamethasone valerate lotion (VALISONE) 0.1 % Apply 1 application topically 2 (two) times daily.    [provider]  CALCIUM ASCORBATE PO Take by mouth.    [provider]  CALCIUM PO Take by mouth.    [provider]  diltiazem (CARDIZEM CD) 240 MG 24 hr capsule Take 240 mg by mouth daily.    [provider]  DOCUSATE CALCIUM PO Take by mouth.    [provider]  hydrocortisone 2.5 % cream Apply topically 2  (two) times daily.    [provider]  losartan-hydrochlorothiazide (HYZAAR) 50-12.5 MG tablet Take 1 tablet by mouth daily.    [provider]  metroNIDAZOLE (METROGEL) 1 % gel Apply topically daily.    [provider]  Multiple Vitamins-Minerals (MULTIVITAMIN ADULT PO) Take by mouth.    [provider]  mupirocin ointment (BACTROBAN) 2 % Apply 1 application topically 2 (two) times daily. Behind R ear 07/30/16   Frederich Cha, MD  naproxen sodium (ANAPROX) 220 MG tablet Take 220 mg by mouth 2 (two) times daily with a meal.    [provider]  predniSONE (DELTASONE) 20 MG tablet Take 2 tablets (40 mg total) by mouth daily with breakfast. 01/31/17   Lavonia Drafts, MD  simvastatin (ZOCOR) 20 MG tablet Take 20 mg by mouth daily.    [provider]  VITAMIN D, ERGOCALCIFEROL, PO Take by mouth.    [provider]    Allergies Patient has no known allergies.  No family history on file.  Social History Social History   Tobacco Use  . Smoking status: Former Research scientist (life sciences)  . Smokeless tobacco: Never Used  Substance Use Topics  . Alcohol use: Yes    Alcohol/week: 1.2 oz    Types: 2 Glasses of wine per week  . Drug use: No    Review of Systems Constitutional: No fever/chills Eyes:  No visual changes. Cardiovascular: Denies chest pain. Respiratory: Denies shortness of breath. Musculoskeletal: Positive right leg pain. Skin: Negative for rash. Neurological: Negative for headaches, focal weakness or numbness. ____________________________________________   PHYSICAL EXAM:  VITAL SIGNS: ED Triage Vitals  Enc Vitals Group     BP 06/28/17 0832 (!) 155/89     Pulse Rate 06/28/17 0832 77     Resp 06/28/17 0832 20     Temp 06/28/17 0832 98 F (36.7 C)     Temp Source 06/28/17 0832 Oral     SpO2 06/28/17 0832 97 %     Weight 06/28/17 0833 142 lb (64.4 kg)     Height 06/28/17 0833 4\' 9"  (1.448 m)     Head Circumference --      Peak  Flow --      Pain Score 06/28/17 0833 2     Pain Loc --      Pain Edu? --      Excl. in [place]? --    Constitutional: Alert and oriented. Well appearing and in no acute distress. Eyes: Conjunctivae are normal.  Head: Atraumatic. Neck: No stridor.   Cardiovascular: Normal rate, regular rhythm. Grossly normal heart sounds.  Good peripheral circulation. Respiratory: Normal respiratory effort.  No retractions. Lungs CTAB. Gastrointestinal: Soft and nontender. No distention.  Musculoskeletal: Examination of the lower extremities there is no appreciable difference in size in comparison with the right to left.  There is some tenderness on palpation of the right calf and there is a very small 2 cm ecchymotic area that appears to be healing without any difficulties.  It is above this area that patient has some tenderness on palpation.  Homans sign is negative.  There is no warmth or redness to her calf.  Pulses present.  Motor sensory function intact distal to this area.  Patient continues to ambulate with the use of cane. Neurologic:  Normal speech and language. No gross focal neurologic deficits are appreciated. No gait instability. Skin:  Skin is warm, dry and intact.  Ecchymotic area as noted above. Psychiatric: Mood and affect are normal. Speech and behavior are normal.  ____________________________________________   LABS (all labs ordered are listed, but only abnormal results are displayed)  Labs Reviewed  CBC WITH DIFFERENTIAL/PLATELET - Abnormal; Notable for the following components:      Result Value   Hemoglobin 11.4 (*)    HCT 34.6 (*)    RDW 17.1 (*)    Platelets 896 (*)    Monocytes Absolute 1.0 (*)    All other components within normal limits  COMPREHENSIVE METABOLIC PANEL - Abnormal; Notable for the following components:   ALT 13 (*)    GFR calc non Af Amer 56 (*)    All other components within normal limits  MAGNESIUM    RADIOLOGY   Official radiology report(s): US  Venous Img Lower Unilateral Right  Result Date: 06/28/2017 CLINICAL DATA:  Right lower extremity pain and swelling. EXAM: RIGHT LOWER EXTREMITY VENOUS DOPPLER ULTRASOUND TECHNIQUE: Gray-scale sonography with graded compression, as well as color Doppler and duplex ultrasound were performed to evaluate the lower extremity deep venous systems from the level of the common femoral vein and including the common femoral, femoral, prfunda femoral, popliteal and calf veins including the posterior tibial, peroneal and gastrocnemius veins when visible. The superficial great saphenous vein was also interrogated. Spectral Doppler was utilized to evaluate flow at rest and with distal augmentation maneuvers in the common femoral, femoral and popliteal veins. COMPARISON:  None. FINDINGS: Contralateral Common Femoral Vein: Respiratory phasicity is normal and symmetric with the symptomatic side. No evidence of thrombus. Normal compressibility. Common Femoral Vein: No evidence of thrombus. Normal compressibility, respiratory phasicity and response to augmentation. Saphenofemoral Junction: No evidence of thrombus. Normal compressibility and flow on color Doppler imaging. Profunda Femoral Vein: No evidence of thrombus. Normal compressibility and flow on color Doppler imaging. Femoral Vein: No evidence of thrombus. Normal compressibility, respiratory phasicity and response to augmentation. Popliteal Vein: No evidence of thrombus. Normal compressibility, respiratory phasicity and response to augmentation. Calf Veins: No evidence of thrombus. Normal compressibility and flow on color Doppler imaging. Other Findings:  None. IMPRESSION: Negative for deep venous thrombosis in right lower extremity. Electronically Signed   By: Markus Daft M.D.   On: 06/28/2017 11:40    ____________________________________________   PROCEDURES  Procedure(s) performed: None  Procedures  Critical Care performed:  No  ____________________________________________   INITIAL IMPRESSION / ASSESSMENT AND PLAN / ED COURSE  As part of my medical decision making, I reviewed the following data within the electronic MEDICAL RECORD NUMBER Notes from prior ED visits and [place] Controlled Substance Database  Patient was made aware that ultrasound did not show any evidence of DVT.  She is reassured.  She will follow-up with her PCP if she continues to have leg cramps.  At this time her electrolytes were all within normal limits.  Patient states that she normally only takes Tylenol if needed for pain.  While being in the department she has been ambulatory several times to the restroom without any difficulties. ____________________________________________   FINAL CLINICAL IMPRESSION(S) / ED DIAGNOSES  Final diagnoses:  Acute leg pain, right     ED Discharge Orders    None       Note:  This document was prepared using Dragon voice recognition software and may include unintentional dictation errors.    Johnn Hai, PA-C 06/28/17 1613    Johnn Hai, PA-C 06/28/17 1615    Harvest Dark, MD 06/30/17 [phone]

## 2017-07-09 ENCOUNTER — Ambulatory Visit: Admit: 2017-07-09 | Discharge: 2017-07-09 | Disposition: A | Discharge status: Home / Self Care | Payer: MEDICARE

## 2017-07-15 ENCOUNTER — Ambulatory Visit: Admit: 2017-07-15 | Discharge: 2017-07-15 | Disposition: A | Discharge status: Home / Self Care | Payer: MEDICARE

## 2017-07-15 ENCOUNTER — Emergency Department: Admit: 2017-07-15 | Discharge: 2017-07-15 | Disposition: A | Discharge status: Home / Self Care | Payer: MEDICARE

## 2017-07-15 DIAGNOSIS — R7302 Impaired glucose tolerance (oral): Secondary | ICD-10-CM | POA: Insufficient documentation

## 2017-07-15 DIAGNOSIS — N183 Chronic kidney disease, stage 3 unspecified: Secondary | ICD-10-CM | POA: Insufficient documentation

## 2017-07-15 DIAGNOSIS — N816 Rectocele: Secondary | ICD-10-CM | POA: Insufficient documentation

## 2017-07-15 DIAGNOSIS — L719 Rosacea, unspecified: Secondary | ICD-10-CM | POA: Insufficient documentation

## 2017-07-15 DIAGNOSIS — M25561 Pain in right knee: Principal | ICD-10-CM

## 2017-08-29 ENCOUNTER — Ambulatory Visit: Admit: 2017-08-29 | Discharge: 2017-08-29 | Disposition: A | Discharge status: Home / Self Care | Payer: MEDICARE

## 2017-08-29 ENCOUNTER — Emergency Department: Admit: 2017-08-29 | Discharge: 2017-08-29 | Disposition: A | Discharge status: Home / Self Care | Payer: MEDICARE

## 2017-08-29 DIAGNOSIS — M25561 Pain in right knee: Principal | ICD-10-CM

## 2017-09-29 ENCOUNTER — Emergency Department: Payer: Medicare Other

## 2017-09-29 ENCOUNTER — Other Ambulatory Visit: Payer: Self-pay

## 2017-09-29 ENCOUNTER — Emergency Department
Admission: EM | Admit: 2017-09-29 | Discharge: 2017-09-29 | Disposition: A | Discharge status: Home / Self Care | Payer: Medicare Other | Attending: Emergency Medicine | Admitting: Emergency Medicine

## 2017-09-29 DIAGNOSIS — Y929 Unspecified place or not applicable: Secondary | ICD-10-CM | POA: Insufficient documentation

## 2017-09-29 DIAGNOSIS — Y999 Unspecified external cause status: Secondary | ICD-10-CM | POA: Diagnosis not present

## 2017-09-29 DIAGNOSIS — Z79899 Other long term (current) drug therapy: Secondary | ICD-10-CM | POA: Diagnosis not present

## 2017-09-29 DIAGNOSIS — R1084 Generalized abdominal pain: Secondary | ICD-10-CM | POA: Diagnosis not present

## 2017-09-29 DIAGNOSIS — I2510 Atherosclerotic heart disease of native coronary artery without angina pectoris: Secondary | ICD-10-CM | POA: Insufficient documentation

## 2017-09-29 DIAGNOSIS — W19XXXA Unspecified fall, initial encounter: Secondary | ICD-10-CM | POA: Diagnosis not present

## 2017-09-29 DIAGNOSIS — S20301A Unspecified superficial injuries of right front wall of thorax, initial encounter: Secondary | ICD-10-CM | POA: Diagnosis present

## 2017-09-29 DIAGNOSIS — Z87891 Personal history of nicotine dependence: Secondary | ICD-10-CM | POA: Diagnosis not present

## 2017-09-29 DIAGNOSIS — Y939 Activity, unspecified: Secondary | ICD-10-CM | POA: Insufficient documentation

## 2017-09-29 DIAGNOSIS — I10 Essential (primary) hypertension: Secondary | ICD-10-CM | POA: Insufficient documentation

## 2017-09-29 DIAGNOSIS — Z7982 Long term (current) use of aspirin: Secondary | ICD-10-CM | POA: Diagnosis not present

## 2017-09-29 DIAGNOSIS — S20211A Contusion of right front wall of thorax, initial encounter: Secondary | ICD-10-CM | POA: Insufficient documentation

## 2017-09-29 LAB — CBC WITH DIFFERENTIAL/PLATELET
BASOS ABS: 0.2 10*3/uL — AB (ref 0–0.1)
BASOS PCT: 2 %
EOS ABS: 0.3 10*3/uL (ref 0–0.7)
Eosinophils Relative: 4 %
HEMATOCRIT: 32.9 % — AB (ref 35.0–47.0)
Hemoglobin: 10.7 g/dL — ABNORMAL LOW (ref 12.0–16.0)
Lymphocytes Relative: 14 %
Lymphs Abs: 1.2 10*3/uL (ref 1.0–3.6)
MCH: 30.1 pg (ref 26.0–34.0)
MCHC: 32.6 g/dL (ref 32.0–36.0)
MCV: 92.3 fL (ref 80.0–100.0)
Monocytes Absolute: 1.1 10*3/uL — ABNORMAL HIGH (ref 0.2–0.9)
Monocytes Relative: 12 %
NEUTROS ABS: 5.8 10*3/uL (ref 1.4–6.5)
NEUTROS PCT: 68 %
Platelets: 869 10*3/uL — ABNORMAL HIGH (ref 150–440)
RBC: 3.56 MIL/uL — ABNORMAL LOW (ref 3.80–5.20)
RDW: 19.6 % — AB (ref 11.5–14.5)
WBC: 8.5 10*3/uL (ref 3.6–11.0)

## 2017-09-29 LAB — LACTIC ACID, PLASMA
LACTIC ACID, VENOUS: 1.1 mmol/L (ref 0.5–1.9)
LACTIC ACID, VENOUS: 0.8 mmol/L (ref 0.5–1.9)

## 2017-09-29 LAB — COMPREHENSIVE METABOLIC PANEL
ALK PHOS: 64 U/L (ref 38–126)
ALT: 14 U/L (ref 0–44)
ANION GAP: 10 (ref 5–15)
AST: 29 U/L (ref 15–41)
Albumin: 3.8 g/dL (ref 3.5–5.0)
BILIRUBIN TOTAL: 0.8 mg/dL (ref 0.3–1.2)
BUN: 13 mg/dL (ref 8–23)
CALCIUM: 9.1 mg/dL (ref 8.9–10.3)
CO2: 22 mmol/L (ref 22–32)
Chloride: 109 mmol/L (ref 98–111)
Creatinine, Ser: 0.93 mg/dL (ref 0.44–1.00)
GFR calc non Af Amer: 54 mL/min — ABNORMAL LOW (ref >60)
GLUCOSE: 106 mg/dL — AB (ref 70–99)
Potassium: 3.9 mmol/L (ref 3.5–5.1)
Sodium: 141 mmol/L (ref 135–145)
TOTAL PROTEIN: 6.5 g/dL (ref 6.5–8.1)

## 2017-09-29 LAB — TROPONIN I

## 2017-09-29 MED ORDER — IOHEXOL 350 MG/ML SOLN
100.0000 mL | Freq: Once | INTRAVENOUS | Status: AC | PRN
  Administered 2017-09-29: 100 mL via INTRAVENOUS

## 2017-09-29 NOTE — ED Notes (Signed)
.   Pt is resting, Respirations even and unlabored, NAD. Stretcher lowest postion and locked. Call bell within reach. Denies any needs at this time RN will continue to monitor.    

## 2017-09-29 NOTE — ED Triage Notes (Signed)
Pt states she fell about a week ago and injured her right ribs, and thinks they are healing but yesterday began having intermittent sharp stabbing epigastric pain, states she has a AAA that they are watching and was concerned. Pt is in NAD on arrival.

## 2017-09-29 NOTE — Discharge Instructions (Signed)
It was a pleasure to take care of you today, and thank you for coming to our emergency department.  If you have any questions or concerns before leaving please ask the nurse to grab me and I'm more than happy to go through your aftercare instructions again.  If you were prescribed any opioid pain medication today such as Norco, Vicodin, Percocet, morphine, hydrocodone, or oxycodone please make sure you do not drive when you are taking this medication as it can alter your ability to drive safely.  If you have any concerns once you are home that you are not improving or are in fact getting worse before you can make it to your follow-up appointment, please do not hesitate to call 911 and come back for further evaluation.  Darel Hong, MD  Results for orders placed or performed during the hospital encounter of 09/29/17  Comprehensive metabolic panel  Result Value Ref Range   Sodium 141 135 - 145 mmol/L   Potassium 3.9 3.5 - 5.1 mmol/L   Chloride 109 98 - 111 mmol/L   CO2 22 22 - 32 mmol/L   Glucose, Bld 106 (H) 70 - 99 mg/dL   BUN 13 8 - 23 mg/dL   Creatinine, Ser 0.93 0.44 - 1.00 mg/dL   Calcium 9.1 8.9 - 10.3 mg/dL   Total Protein 6.5 6.5 - 8.1 g/dL   Albumin 3.8 3.5 - 5.0 g/dL   AST 29 15 - 41 U/L   ALT 14 0 - 44 U/L   Alkaline Phosphatase 64 38 - 126 U/L   Total Bilirubin 0.8 0.3 - 1.2 mg/dL   GFR calc non Af Amer 54 (L) >60 mL/min   GFR calc Af Amer >60 >60 mL/min   Anion gap 10 5 - 15  CBC with Differential  Result Value Ref Range   WBC 8.5 3.6 - 11.0 K/uL   RBC 3.56 (L) 3.80 - 5.20 MIL/uL   Hemoglobin 10.7 (L) 12.0 - 16.0 g/dL   HCT 32.9 (L) 35.0 - 47.0 %   MCV 92.3 80.0 - 100.0 fL   MCH 30.1 26.0 - 34.0 pg   MCHC 32.6 32.0 - 36.0 g/dL   RDW 19.6 (H) 11.5 - 14.5 %   Platelets 869 (H) 150 - 440 K/uL   Neutrophils Relative % 68 %   Neutro Abs 5.8 1.4 - 6.5 K/uL   Lymphocytes Relative 14 %   Lymphs Abs 1.2 1.0 - 3.6 K/uL   Monocytes Relative 12 %   Monocytes Absolute  1.1 (H) 0.2 - 0.9 K/uL   Eosinophils Relative 4 %   Eosinophils Absolute 0.3 0 - 0.7 K/uL   Basophils Relative 2 %   Basophils Absolute 0.2 (H) 0 - 0.1 K/uL  Troponin I  Result Value Ref Range   Troponin I <0.03 <0.03 ng/mL  Lactic acid, plasma  Result Value Ref Range   Lactic Acid, Venous 1.1 0.5 - 1.9 mmol/L   Ct Angio Chest/abd/pel For Dissection W And/or W/wo  Result Date: 09/29/2017 CLINICAL DATA:  Epigastric abdominal pain after fall last week. History of abdominal aortic aneurysm. EXAM: CT ANGIOGRAPHY CHEST, ABDOMEN AND PELVIS TECHNIQUE: Multidetector CT imaging through the chest, abdomen and pelvis was performed using the standard protocol during bolus administration of intravenous contrast. Multiplanar reconstructed images and MIPs were obtained and reviewed to evaluate the vascular anatomy. CONTRAST:  178mL OMNIPAQUE IOHEXOL 350 MG/ML SOLN COMPARISON:  CT scan of March 25, 2014. FINDINGS: CTA CHEST FINDINGS Cardiovascular: There is no evidence  of thoracic aortic dissection. Atherosclerosis of thoracic aorta is noted. 4.2 cm ascending thoracic aortic aneurysm is noted. 4.2 cm descending thoracic aortic aneurysm is noted. Great vessels are widely patent without significant stenosis. No pericardial effusion is noted. Mediastinum/Nodes: No enlarged mediastinal, hilar, or axillary lymph nodes. Thyroid gland, trachea, and esophagus demonstrate no significant findings. Lungs/Pleura: Lungs are clear. No pleural effusion or pneumothorax. Musculoskeletal: No chest wall abnormality. No acute or significant osseous findings. Aortic Atherosclerosis (ICD10-I70.0). Review of the MIP images confirms the above findings. CTA ABDOMEN AND PELVIS FINDINGS VASCULAR Aorta: Atherosclerosis of abdominal aorta is noted without aneurysm or dissection. Celiac: Patent without evidence of aneurysm, dissection, vasculitis or significant stenosis. SMA: Patent without evidence of aneurysm, dissection, vasculitis or  significant stenosis. Renals: Both renal arteries are patent without evidence of aneurysm, dissection, vasculitis, fibromuscular dysplasia or significant stenosis. IMA: Patent without evidence of aneurysm, dissection, vasculitis or significant stenosis. Inflow: Patent without evidence of aneurysm, dissection, vasculitis or significant stenosis. Veins: No obvious venous abnormality within the limitations of this arterial phase study. Review of the MIP images confirms the above findings. NON-VASCULAR Hepatobiliary: No focal liver abnormality is seen. Status post cholecystectomy. No biliary dilatation. Pancreas: Unremarkable. No pancreatic ductal dilatation or surrounding inflammatory changes. Spleen: Normal in size without focal abnormality. Adrenals/Urinary Tract: Adrenal glands are unremarkable. Kidneys are normal, without renal calculi, focal lesion, or hydronephrosis. Bladder is unremarkable. Stomach/Bowel: Stomach is within normal limits. Appendix appears normal. No evidence of bowel wall thickening, distention, or inflammatory changes. Diverticulosis of descending and sigmoid colon is noted without inflammation. Lymphatic: No significant adenopathy is noted. Reproductive: Uterus and bilateral adnexa are unremarkable. Other: No abdominal wall hernia or abnormality. No abdominopelvic ascites. Musculoskeletal: Severe multilevel degenerative disc disease is noted in the lumbar spine. No acute osseous abnormality is noted. Review of the MIP images confirms the above findings. IMPRESSION: 4.2 cm ascending thoracic aortic aneurysm is noted as well as 4.2 cm descending thoracic aortic aneurysm. Recommend annual imaging followup by CTA or MRA. This recommendation follows 2010 ACCF/AHA/AATS/ACR/ASA/SCA/SCAI/SIR/STS/SVM Guidelines for the Diagnosis and Management of Patients with Thoracic Aortic Disease. Circulation. 2010; 121: P103-P594. No evidence of thoracic or abdominal aortic dissection. Great vessels are widely  patent. Mesenteric and renal arteries are patent. Diverticulosis of descending and sigmoid colon is noted without inflammation. Severe multilevel degenerative disc disease is noted in lumbar spine. Aortic Atherosclerosis (ICD10-I70.0). Electronically Signed   By: Marijo Conception, M.D.   On: 09/29/2017 12:42

## 2017-09-29 NOTE — ED Provider Notes (Signed)
[facility] Emergency Department Provider Note  ____________________________________________   First MD Initiated Contact with Patient 09/29/17 1133     (approximate)  I have reviewed the triage vital signs and the nursing notes.   HISTORY  Chief Complaint Abdominal Pain   HPI Mary Villarreal is a 84 y.o. female who self presents the emergency department with pain in her right chest and upper abdomen.  She fell about a week ago onto her right side and thought that she bruised ribs or maybe she broke them.  Her pain improved however earlier today she had sharp stabbing epigastric pain which concerned her because she has a known AAA and a known thoracic aortic aneurysm.  Symptoms came on gradually are moderate severity nothing seems to make them better or worse.  She denies syncope.  Her chest pain is worse with deep inspiration.    Past Medical History:  Diagnosis Date  . Aortic aneurysm, abdominal ([place])   . Bladder prolapse, female, acquired   . Coronary artery disease   . Hyperlipidemia   . Hypertension   . TIA (transient ischemic attack)     There are no active problems to display for this patient.   History reviewed. No pertinent surgical history.  Prior to Admission medications   Medication Sig Start Date End Date Taking? Authorizing Provider  aspirin 81 MG chewable tablet Chew 81 mg by mouth daily.    Yes [provider]  CALCIUM PO Take 1 tablet by mouth daily.    Yes [provider]  diltiazem (CARDIZEM CD) 240 MG 24 hr capsule Take 240 mg by mouth daily.   Yes [provider]  losartan (COZAAR) 50 MG tablet Take 1 tablet by mouth 2 (two) times daily. 08/20/17  Yes [provider]  Multiple Vitamins-Minerals (MULTIVITAMIN ADULT PO) Take 1 tablet by mouth daily.    Yes [provider]  simvastatin (ZOCOR) 20 MG tablet Take 20 mg by mouth daily.   Yes [provider]  Vitamin D,  Ergocalciferol, 2000 units CAPS Take 1 capsule by mouth daily.    Yes [provider]  mupirocin ointment (BACTROBAN) 2 % Apply 1 application topically 2 (two) times daily. Behind R ear Patient not taking: Reported on 09/29/2017 07/30/16   Frederich Cha, MD  predniSONE (DELTASONE) 20 MG tablet Take 2 tablets (40 mg total) by mouth daily with breakfast. Patient not taking: Reported on 09/29/2017 01/31/17   Lavonia Drafts, MD    Allergies Patient has no known allergies.  No family history on file.  Social History Social History   Tobacco Use  . Smoking status: Former Research scientist (life sciences)  . Smokeless tobacco: Never Used  Substance Use Topics  . Alcohol use: Yes    Alcohol/week: 2.0 standard drinks    Types: 2 Glasses of wine per week  . Drug use: No    Review of Systems Constitutional: No fever/chills Eyes: No visual changes. ENT: No sore throat. Cardiovascular: Positive for chest pain. Respiratory: Denies shortness of breath. Gastrointestinal: Positive for abdominal pain.  No nausea, no vomiting.  No diarrhea.  No constipation. Genitourinary: Negative for dysuria. Musculoskeletal: Negative for back pain. Skin: Negative for rash. Neurological: Negative for headaches, focal weakness or numbness.   ____________________________________________   PHYSICAL EXAM:  VITAL SIGNS: ED Triage Vitals  Enc Vitals Group     BP 09/29/17 1021 96/71     Pulse Rate 09/29/17 1021 84     Resp 09/29/17 1021 18  Temp 09/29/17 1021 98.3 F (36.8 C)     Temp Source 09/29/17 1021 Oral     SpO2 09/29/17 1021 98 %     Weight 09/29/17 1022 145 lb (65.8 kg)     Height 09/29/17 1022 4\' 9"  (1.448 m)     Head Circumference --      Peak Flow --      Pain Score 09/29/17 1021 3     Pain Loc --      Pain Edu? --      Excl. in [place]? --     Constitutional: Alert and oriented x4 pleasant cooperative speaks in full clear sentences no diaphoresis Eyes: PERRL EOMI. Head: Atraumatic. Nose: No  congestion/rhinnorhea. Mouth/Throat: No trismus Neck: No stridor.   Cardiovascular: Normal rate, regular rhythm. Grossly normal heart sounds.  Good peripheral circulation. Respiratory: Normal respiratory effort.  No retractions. Lungs CTAB and moving good air Gastrointestinal: Soft nontender no peritonitis Musculoskeletal: No lower extremity edema   Neurologic:  Normal speech and language. No gross focal neurologic deficits are appreciated. Skin:  Skin is warm, dry and intact. No rash noted. Psychiatric: Mood and affect are normal. Speech and behavior are normal.    ____________________________________________   DIFFERENTIAL includes but not limited to  AAA, aortic dissection, rib contusion, rib fracture, pulmonary contusion ____________________________________________   LABS (all labs ordered are listed, but only abnormal results are displayed)  Labs Reviewed  COMPREHENSIVE METABOLIC PANEL - Abnormal; Notable for the following components:      Result Value   Glucose, Bld 106 (*)    GFR calc non Af Amer 54 (*)    All other components within normal limits  CBC WITH DIFFERENTIAL/PLATELET - Abnormal; Notable for the following components:   RBC 3.56 (*)    Hemoglobin 10.7 (*)    HCT 32.9 (*)    RDW 19.6 (*)    Platelets 869 (*)    Monocytes Absolute 1.1 (*)    Basophils Absolute 0.2 (*)    All other components within normal limits  TROPONIN I  LACTIC ACID, PLASMA  LACTIC ACID, PLASMA    Lab work reviewed by me with increased platelets otherwise unremarkable __________________________________________  EKG   ____________________________________________  RADIOLOGY  CT dissection protocol reviewed by me with no acute disease ____________________________________________   PROCEDURES  Procedure(s) performed: no  Procedures  Critical Care performed: no  ____________________________________________   INITIAL IMPRESSION / ASSESSMENT AND PLAN / ED  COURSE  Pertinent labs & imaging results that were available during my care of the patient were reviewed by me and considered in my medical decision making (see chart for details).   As part of my medical decision making, I reviewed the following data within the [place] History obtained from family if available, nursing notes, old chart and ekg, as well as notes from prior ED visits.  The patient arrives mostly anxious appearing.  She does have new abdominal pain with known history of thoracic aortic aneurysm and AAA so CT angiogram obtained.  I offer the patient pain medication however she declined.  CT is fortunately reassuring.  The patient's chest pain is likely secondary to contused ribs.  She will be discharged home with primary care follow-up.      ____________________________________________   FINAL CLINICAL IMPRESSION(S) / ED DIAGNOSES  Final diagnoses:  Generalized abdominal pain  Contusion of rib on right side, initial encounter      NEW MEDICATIONS STARTED DURING THIS VISIT:  Discharge Medication List  as of 09/29/2017  1:22 PM       Note:  This document was prepared using Dragon voice recognition software and may include unintentional dictation errors.     Darel Hong, MD 10/02/17 [phone]

## 2018-01-14 DIAGNOSIS — I351 Nonrheumatic aortic (valve) insufficiency: Secondary | ICD-10-CM | POA: Insufficient documentation

## 2018-05-02 DIAGNOSIS — D75839 Thrombocytosis, unspecified: Secondary | ICD-10-CM | POA: Insufficient documentation

## 2018-05-02 DIAGNOSIS — D473 Essential (hemorrhagic) thrombocythemia: Secondary | ICD-10-CM | POA: Insufficient documentation

## 2018-05-02 NOTE — Progress Notes (Deleted)
[facility] day:  05/02/2018  Chief Complaint: Mary Villarreal is a 85 y.o. female with abnormal laboratory findings who is referred by Gaetano Net, NP for assessment and management.  HPI:  The patient was seen by   CBC on 04/18/2018 revealed a hematocrit of 33.0, hemoglobin 10.8, MCV 98.5, platelets 678,000, WBC 7400 with an ANC of 4600.  Creatinine was 0.8.  LFTs were normal.  Sed rate was < 7.  CBC on 04/26/2018 revealed a hematocrit of 33.2, hemoglobin 10.5, MCV 101.2, platelets 671,000, WBC 7700 with an [place] 4990.  Differential was unremarkable.  Iron saturation was 22% with a TIBC of 493.8 (261-478).  CRP was < 1.  Review of prior labs reveals  Past Medical History:  Diagnosis Date  . Aortic aneurysm, abdominal ([place])   . Bladder prolapse, female, acquired   . Coronary artery disease   . Hyperlipidemia   . Hypertension   . TIA (transient ischemic attack)     No past surgical history on file.  No family history on file.  Social History:  reports that she has quit smoking. She has never used smokeless tobacco. She reports current alcohol use of about 2.0 standard drinks of alcohol per week. She reports that she does not use drugs.  The patient has smoked 2.5 ppd x 40 years (100 pack year smoking history).  She stopped smoking in 04/1990.  The patient is widowed.  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  The patient is accompanied by *** alone today.  Allergies: No Known Allergies  Current Medications: Current Outpatient Medications  Medication Sig Dispense Refill  . aspirin 81 MG chewable tablet Chew 81 mg by mouth daily.     Marland Kitchen CALCIUM PO Take 1 tablet by mouth daily.     Marland Kitchen diltiazem (CARDIZEM CD) 240 MG 24 hr capsule Take 240 mg by mouth daily.    Marland Kitchen losartan (COZAAR) 50 MG tablet Take 1 tablet by mouth 2 (two) times daily.  1  . Multiple Vitamins-Minerals (MULTIVITAMIN ADULT PO) Take 1 tablet by  mouth daily.     . mupirocin ointment (BACTROBAN) 2 % Apply 1 application topically 2 (two) times daily. Behind R ear (Patient not taking: Reported on 09/29/2017) 22 g 0  . predniSONE (DELTASONE) 20 MG tablet Take 2 tablets (40 mg total) by mouth daily with breakfast. (Patient not taking: Reported on 09/29/2017) 8 tablet 0  . simvastatin (ZOCOR) 20 MG tablet Take 20 mg by mouth daily.    . Vitamin D, Ergocalciferol, 2000 units CAPS Take 1 capsule by mouth daily.      No current facility-administered medications for this visit.     Review of Systems:  GENERAL:  Feels good.  Active.  No fevers, sweats or weight loss. PERFORMANCE STATUS (ECOG):  *** HEENT:  No visual changes, runny nose, sore throat, mouth sores or tenderness. Lungs: No shortness of breath or cough.  No hemoptysis. Cardiac:  No chest pain, palpitations, orthopnea, or PND. GI:  No nausea, vomiting, diarrhea, constipation, melena or hematochezia. GU:  No urgency, frequency, dysuria, or hematuria. Musculoskeletal:  No back pain.  No joint pain.  No muscle tenderness. Extremities:  No pain or swelling. Skin:  No rashes or skin changes. Neuro:  No headache, numbness or weakness, balance or coordination issues. Endocrine:  No diabetes, thyroid issues, hot flashes or night sweats. Psych:  No mood changes, depression or anxiety. Pain:  No  focal pain. Review of systems:  All other systems reviewed and found to be negative.  Physical Exam: There were no vitals taken for this visit. GENERAL:  Well developed, well nourished, **man sitting comfortably in the exam room in no acute distress. MENTAL STATUS:  Alert and oriented to person, place and time. HEAD:  *** hair.  Normocephalic, atraumatic, face symmetric, no Cushingoid features. EYES:  *** eyes.  Pupils equal round and reactive to light and accomodation.  No conjunctivitis or scleral icterus. ENT:  Oropharynx clear without lesion.  Tongue normal. Mucous membranes moist.   RESPIRATORY:  Clear to auscultation without rales, wheezes or rhonchi. CARDIOVASCULAR:  Regular rate and rhythm without murmur, rub or gallop. ABDOMEN:  Soft, non-tender, with active bowel sounds, and no hepatosplenomegaly.  No masses. SKIN:  No rashes, ulcers or lesions. EXTREMITIES: No edema, no skin discoloration or tenderness.  No palpable cords. LYMPH NODES: No palpable cervical, supraclavicular, axillary or inguinal adenopathy  NEUROLOGICAL: Unremarkable. PSYCH:  Appropriate.   No visits with results within 3 Day(s) from this visit.  Latest known visit with results is:  Admission on 09/29/2017, Discharged on 09/29/2017  Component Date Value Ref Range Status  . Sodium 09/29/2017 141  135 - 145 mmol/L Final  . Potassium 09/29/2017 3.9  3.5 - 5.1 mmol/L Final  . Chloride 09/29/2017 109  98 - 111 mmol/L Final  . CO2 09/29/2017 22  22 - 32 mmol/L Final  . Glucose, Bld 09/29/2017 106* 70 - 99 mg/dL Final  . BUN 09/29/2017 13  8 - 23 mg/dL Final  . Creatinine, Ser 09/29/2017 0.93  0.44 - 1.00 mg/dL Final  . Calcium 09/29/2017 9.1  8.9 - 10.3 mg/dL Final  . Total Protein 09/29/2017 6.5  6.5 - 8.1 g/dL Final  . Albumin 09/29/2017 3.8  3.5 - 5.0 g/dL Final  . AST 09/29/2017 29  15 - 41 U/L Final  . ALT 09/29/2017 14  0 - 44 U/L Final  . Alkaline Phosphatase 09/29/2017 64  38 - 126 U/L Final  . Total Bilirubin 09/29/2017 0.8  0.3 - 1.2 mg/dL Final  . GFR calc non Af Amer 09/29/2017 54* >60 mL/min Final  . GFR calc Af Amer 09/29/2017 >60  >60 mL/min Final   Comment: (NOTE) The eGFR has been calculated using the CKD EPI equation. This calculation has not been validated in all clinical situations. eGFR's persistently <60 mL/min signify possible Chronic Kidney Disease.   Georgiann Hahn gap 09/29/2017 10  5 - 15 Final   Performed at [facility], [address] [place], [place] [address]  . WBC 09/29/2017 8.5  3.6 - 11.0 K/uL Final  . RBC 09/29/2017 3.56* 3.80 - 5.20 MIL/uL Final   . Hemoglobin 09/29/2017 10.7* 12.0 - 16.0 g/dL Final  . HCT 09/29/2017 32.9* 35.0 - 47.0 % Final  . MCV 09/29/2017 92.3  80.0 - 100.0 fL Final  . MCH 09/29/2017 30.1  26.0 - 34.0 pg Final  . MCHC 09/29/2017 32.6  32.0 - 36.0 g/dL Final  . RDW 09/29/2017 19.6* 11.5 - 14.5 % Final  . Platelets 09/29/2017 869* 150 - 440 K/uL Final  . Neutrophils Relative % 09/29/2017 68  % Final  . Neutro Abs 09/29/2017 5.8  1.4 - 6.5 K/uL Final  . Lymphocytes Relative 09/29/2017 14  % Final  . Lymphs Abs 09/29/2017 1.2  1.0 - 3.6 K/uL Final  . Monocytes Relative 09/29/2017 12  % Final  . Monocytes Absolute 09/29/2017 1.1* 0.2 - 0.9  K/uL Final  . Eosinophils Relative 09/29/2017 4  % Final  . Eosinophils Absolute 09/29/2017 0.3  0 - 0.7 K/uL Final  . Basophils Relative 09/29/2017 2  % Final  . Basophils Absolute 09/29/2017 0.2* 0 - 0.1 K/uL Final   Performed at [facility], [address], [place], [place] [address]  . Troponin I 09/29/2017 <0.03  <0.03 ng/mL Final   Performed at [facility], [address] [place], [place] [address]  . Lactic Acid, Venous 09/29/2017 1.1  0.5 - 1.9 mmol/L Final   Performed at [facility], [address] [place], [place] [address]  . Lactic Acid, Venous 09/29/2017 0.8  0.5 - 1.9 mmol/L Final   Performed at [facility], [address] [place], [place] [address]    Assessment:  Mary Villarreal is a 85 y.o. female ***  Plan: 1.   2.   3.   [place], MD  05/02/2018, 5:59 PM   I saw and evaluated the patient, participating in the key portions of the service and reviewing pertinent diagnostic studies and records.  I reviewed the nurse practitioner's note and agree with the findings and the plan.  The assessment and plan were discussed with the patient.  Additional diagnostic studies of *** are needed to clarify *** and would change the clinical management.  A few ***multiple questions were asked by the  patient and answered.   Nolon Stalls, MD 05/02/2018,5:59 PM

## 2018-05-03 ENCOUNTER — Encounter: Payer: Self-pay | Admitting: Hematology and Oncology

## 2018-05-03 ENCOUNTER — Inpatient Hospital Stay: Payer: Medicare Other | Attending: Hematology and Oncology | Admitting: Hematology and Oncology

## 2018-05-03 ENCOUNTER — Inpatient Hospital Stay: Payer: Medicare Other

## 2018-05-03 VITALS — BP 162/81 | HR 74 | Temp 97.0°F | Resp 18 | Wt 138.2 lb

## 2018-05-03 DIAGNOSIS — Z87891 Personal history of nicotine dependence: Secondary | ICD-10-CM | POA: Insufficient documentation

## 2018-05-03 DIAGNOSIS — D7589 Other specified diseases of blood and blood-forming organs: Secondary | ICD-10-CM | POA: Diagnosis not present

## 2018-05-03 DIAGNOSIS — D649 Anemia, unspecified: Secondary | ICD-10-CM | POA: Insufficient documentation

## 2018-05-03 DIAGNOSIS — D473 Essential (hemorrhagic) thrombocythemia: Secondary | ICD-10-CM | POA: Insufficient documentation

## 2018-05-03 DIAGNOSIS — E538 Deficiency of other specified B group vitamins: Secondary | ICD-10-CM | POA: Insufficient documentation

## 2018-05-03 DIAGNOSIS — I2510 Atherosclerotic heart disease of native coronary artery without angina pectoris: Secondary | ICD-10-CM | POA: Insufficient documentation

## 2018-05-03 DIAGNOSIS — Z1589 Genetic susceptibility to other disease: Secondary | ICD-10-CM | POA: Insufficient documentation

## 2018-05-03 DIAGNOSIS — D75839 Thrombocytosis, unspecified: Secondary | ICD-10-CM

## 2018-05-03 DIAGNOSIS — I10 Essential (primary) hypertension: Secondary | ICD-10-CM | POA: Insufficient documentation

## 2018-05-03 LAB — CBC WITH DIFFERENTIAL/PLATELET
Abs Immature Granulocytes: 0.04 10*3/uL (ref 0.00–0.07)
Basophils Absolute: 0.1 10*3/uL (ref 0.0–0.1)
Basophils Relative: 1 %
Eosinophils Absolute: 0.2 10*3/uL (ref 0.0–0.5)
Eosinophils Relative: 2 %
HCT: 31.5 % — ABNORMAL LOW (ref 36.0–46.0)
Hemoglobin: 10.1 g/dL — ABNORMAL LOW (ref 12.0–15.0)
Immature Granulocytes: 1 %
Lymphocytes Relative: 16 %
Lymphs Abs: 1.2 10*3/uL (ref 0.7–4.0)
MCH: 32.2 pg (ref 26.0–34.0)
MCHC: 32.1 g/dL (ref 30.0–36.0)
MCV: 100.3 fL — ABNORMAL HIGH (ref 80.0–100.0)
Monocytes Absolute: 0.9 10*3/uL (ref 0.1–1.0)
Monocytes Relative: 13 %
Neutro Abs: 4.9 10*3/uL (ref 1.7–7.7)
Neutrophils Relative %: 67 %
Platelets: 583 10*3/uL — ABNORMAL HIGH (ref 150–400)
RBC: 3.14 MIL/uL — ABNORMAL LOW (ref 3.87–5.11)
RDW: 18.6 % — ABNORMAL HIGH (ref 11.5–15.5)
WBC: 7.2 10*3/uL (ref 4.0–10.5)
nRBC: 0.6 % — ABNORMAL HIGH (ref 0.0–0.2)

## 2018-05-03 LAB — TSH: TSH: 1.586 u[IU]/mL (ref 0.350–4.500)

## 2018-05-03 LAB — RETICULOCYTES
Immature Retic Fract: 22.7 % — ABNORMAL HIGH (ref 2.3–15.9)
RBC.: 3.19 MIL/uL — ABNORMAL LOW (ref 3.87–5.11)
Retic Count, Absolute: 48.2 10*3/uL (ref 19.0–186.0)
Retic Ct Pct: 1.5 % (ref 0.4–3.1)

## 2018-05-03 LAB — IRON AND TIBC
Iron: 117 ug/dL (ref 28–170)
Saturation Ratios: 27 % (ref 10.4–31.8)
TIBC: 436 ug/dL (ref 250–450)
UIBC: 319 ug/dL

## 2018-05-03 LAB — VITAMIN B12: Vitamin B-12: 272 pg/mL (ref 180–914)

## 2018-05-03 LAB — C-REACTIVE PROTEIN: CRP: <0.8 mg/dL (ref <1.0)

## 2018-05-03 LAB — FERRITIN: Ferritin: 35 ng/mL (ref 11–307)

## 2018-05-03 LAB — FOLATE: Folate: 39 ng/mL (ref >5.9)

## 2018-05-03 LAB — SEDIMENTATION RATE: Sed Rate: 21 mm/hr (ref 0–22)

## 2018-05-03 NOTE — Progress Notes (Signed)
[facility] day:  05/03/2018  Chief Complaint: Mary Villarreal is a 85 y.o. female with abnormal laboratory findings who is referred by Gaetano Net, NP for assessment and management.  HPI:  The patient notes a 6 week history of worsening left sided vision and left temporal artery pulsating.  She states that she now has to use a magnifying glass to read the newspaper.  She has been seen by ophthalmology, Dr Ellin Mayhew, 3 times.  She was told that her cornea is swelling.  She has been prescribed prednisone ophthalmic drops x 4 weeks with no improvement in vision.  She styates that at times, she is sensitive to the light and has to wear dark glasses.  The patient was seen on 04/18/2018 for 6 month follow-up.  At that time, she noted the above history.  Labs were drawn.  CBC on 04/18/2018 revealed a hematocrit of 33.0, hemoglobin 10.8, MCV 98.5, platelets 678,000, WBC 7400 with an ANC of 4600.  Creatinine was 0.8.  LFTs were normal.  Sed rate was 17.  CBC on 04/26/2018 revealed a hematocrit of 33.2, hemoglobin 10.5, MCV 101.2, platelets 671,000, WBC 7700 with an [place] 4990.  Differential was unremarkable.  Iron saturation was 22% with a TIBC of 493.8 (261-478).  CRP was < 1.  Review of prior labs in the [facility] system revealed a normal hemoglobin in 04/18/2014 and 03/25/2015.  Platelet count was 373,000 on 03/25/2015.  B12 was 284 (low) on 03/25/2015.  TSH was 1.279 on 03/25/2015.  Labs in Epic revealed the following: 08/18/2014: hematocrit 39.5, hemoglobin 13.2, MCV 93, platelets 317,000, WBC 8800 with an [place] of 5500.  Absolute monocyte count was 1100. 01/31/2017: hematocrit 34.8, hemoglobin 11.8, MCV 92.1, platelets 795,000, WBC 6400. 06/28/2017: hematocrit 34.6, hemoglobin 11.4, MCV 88.1, platelets 896,000, WBC 7200 with an ANC of 4700.  Absolute monocyte count was 1000. 09/29/2017: hematocrit 32.9, hemoglobin 10.7, MCV 92.3, platelets 869,000, WBC 8500 with  an ANC of 5800.  Absolute monocyte count was 1100.  Symptomatically, she notes no fevers, sweats or weight loss.  She notes chronic problems with dehydration.  She has never had a colonoscopy.  She denies any melena, hematochezia, hematuria or vaginal bleeding.  She has issues with vaginal prolapse.  She has been a vegetarian for a few years.  She is also a "junkatarian" (likes to eat junk food).  She does not eat dark green leafy vegetables.  She denies any pica.  She denies restless legs.   Past Medical History:  Diagnosis Date  . Aortic aneurysm, abdominal ([place])   . Bladder prolapse, female, acquired   . Coronary artery disease   . Hyperlipidemia   . Hypertension   . TIA (transient ischemic attack)     Past Surgical History:  Procedure Laterality Date  . DG GALL BLADDER      History reviewed. No pertinent family history.  Social History:  reports that she has quit smoking. She has a 80.00 pack-year smoking history. She has never used smokeless tobacco. She reports current alcohol use of about 2.0 standard drinks of alcohol per week. She reports that she does not use drugs.  The patient has smoked 2 ppd x 40 years (80 pack year smoking history).  She stopped smoking at the age of 68.  She drinks red wine nightly (1-1.5 glasses).  The patient is widowed.    She has dual citizenship with [place] and the [employer].  She is from [place].  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  She lives in [place]. The patient is alone today.  Allergies: No Known Allergies  Current Medications: Current Outpatient Medications  Medication Sig Dispense Refill  . aspirin 81 MG chewable tablet Chew 81 mg by mouth daily.     Marland Kitchen CALCIUM PO Take 1 tablet by mouth daily.     Marland Kitchen diltiazem (CARDIZEM CD) 240 MG 24 hr capsule Take 240 mg by mouth  daily.    Marland Kitchen losartan (COZAAR) 50 MG tablet Take 1 tablet by mouth 2 (two) times daily.  1  . Multiple Vitamins-Minerals (MULTIVITAMIN ADULT PO) Take 1 tablet by mouth daily.     . simvastatin (ZOCOR) 20 MG tablet Take 20 mg by mouth daily.    . Vitamin D, Ergocalciferol, 2000 units CAPS Take 1 capsule by mouth daily.     . mupirocin ointment (BACTROBAN) 2 % Apply 1 application topically 2 (two) times daily. Behind R ear (Patient not taking: Reported on 09/29/2017) 22 g 0  . predniSONE (DELTASONE) 20 MG tablet Take 2 tablets (40 mg total) by mouth daily with breakfast. (Patient not taking: Reported on 09/29/2017) 8 tablet 0   No current facility-administered medications for this visit.     Review of Systems:  GENERAL:  Feels "not quite myself".  No fevers, sweats or weight loss. PERFORMANCE STATUS (ECOG):  1 HEENT:  Decreased vision left eye (see HPI).  Constant runny nose secondary to deviated septum.  No sore throat, mouth sores or tenderness. Lungs: No shortness of breath or cough.  No hemoptysis. Cardiac:  Palpitations from "time to time".   No chest pain, palpitations, orthopnea, or PND. GI:  No nausea, vomiting, diarrhea, constipation, melena or hematochezia.  Never had a colonoscopy. GU:  Vaginal prolapse.  No urgency, frequency, dysuria, or hematuria. Musculoskeletal:  Degenerative disease disease.  "Deteriorating joints".  Mostly knee issues (R > L) and anterior tibia.  Arthritis is hands.  No muscle tenderness. Extremities:  No pain or swelling. Skin:  No rashes or skin changes. Neuro:  No headache, numbness or weakness, or coordination issues. "Little off balance". Endocrine:  No diabetes, thyroid issues, hot flashes or night sweats. Psych:  Stress associated with sister's Alzheimer's.  No mood changes, depression or anxiety. Pain:  No focal pain. Review of systems:  All other systems reviewed and found to be negative.  Physical Exam: Blood pressure (!) 162/81, pulse 74,  temperature (!) 97 F (36.1 C), temperature source Tympanic, resp. rate 18, weight 138 lb 3.7 oz (62.7 kg). GENERAL:  Well developed, well nourished, woman sitting comfortably in the exam room in no acute distress. MENTAL STATUS:  Alert and oriented to person, place and time. HEAD:  Short styled gray hair.  Normocephalic, atraumatic, face symmetric, no Cushingoid features.  Left temporal artery discomfort on palpation. EYES:  Glasses.  Blue eyes s/p cataract surgery.  Pupils equal round and reactive to light and accomodation.  No conjunctivitis or scleral icterus. ENT:  Oropharynx clear without lesion.  Tongue normal.  Dentures.  Mucous membranes moist.  RESPIRATORY:  Clear to auscultation without rales, wheezes or rhonchi. CARDIOVASCULAR:  Regular rate and rhythm without murmur, rub or gallop. ABDOMEN:  Soft, non-tender, with active bowel sounds, and no hepatosplenomegaly.  No masses. SKIN:  No rashes, ulcers or lesions.  EXTREMITIES:  Arthritic changes in hands.  No edema, no skin discoloration or tenderness.  No palpable cords. LYMPH NODES: No palpable cervical, supraclavicular, axillary or inguinal adenopathy  NEUROLOGICAL: Unremarkable. PSYCH:  Appropriate.   No visits with results within 3 Day(s) from this visit.  Latest known visit with results is:  Admission on 09/29/2017, Discharged on 09/29/2017  Component Date Value Ref Range Status  . Sodium 09/29/2017 141  135 - 145 mmol/L Final  . Potassium 09/29/2017 3.9  3.5 - 5.1 mmol/L Final  . Chloride 09/29/2017 109  98 - 111 mmol/L Final  . CO2 09/29/2017 22  22 - 32 mmol/L Final  . Glucose, Bld 09/29/2017 106* 70 - 99 mg/dL Final  . BUN 09/29/2017 13  8 - 23 mg/dL Final  . Creatinine, Ser 09/29/2017 0.93  0.44 - 1.00 mg/dL Final  . Calcium 09/29/2017 9.1  8.9 - 10.3 mg/dL Final  . Total Protein 09/29/2017 6.5  6.5 - 8.1 g/dL Final  . Albumin 09/29/2017 3.8  3.5 - 5.0 g/dL Final  . AST 09/29/2017 29  15 - 41 U/L Final  . ALT  09/29/2017 14  0 - 44 U/L Final  . Alkaline Phosphatase 09/29/2017 64  38 - 126 U/L Final  . Total Bilirubin 09/29/2017 0.8  0.3 - 1.2 mg/dL Final  . GFR calc non Af Amer 09/29/2017 54* >60 mL/min Final  . GFR calc Af Amer 09/29/2017 >60  >60 mL/min Final   Comment: (NOTE) The eGFR has been calculated using the CKD EPI equation. This calculation has not been validated in all clinical situations. eGFR's persistently <60 mL/min signify possible Chronic Kidney Disease.   Georgiann Hahn gap 09/29/2017 10  5 - 15 Final   Performed at [facility], [address] [place], [place] [address]  . WBC 09/29/2017 8.5  3.6 - 11.0 K/uL Final  . RBC 09/29/2017 3.56* 3.80 - 5.20 MIL/uL Final  . Hemoglobin 09/29/2017 10.7* 12.0 - 16.0 g/dL Final  . HCT 09/29/2017 32.9* 35.0 - 47.0 % Final  . MCV 09/29/2017 92.3  80.0 - 100.0 fL Final  . MCH 09/29/2017 30.1  26.0 - 34.0 pg Final  . MCHC 09/29/2017 32.6  32.0 - 36.0 g/dL Final  . RDW 09/29/2017 19.6* 11.5 - 14.5 % Final  . Platelets 09/29/2017 869* 150 - 440 K/uL Final  . Neutrophils Relative % 09/29/2017 68  % Final  . Neutro Abs 09/29/2017 5.8  1.4 - 6.5 K/uL Final  . Lymphocytes Relative 09/29/2017 14  % Final  . Lymphs Abs 09/29/2017 1.2  1.0 - 3.6 K/uL Final  . Monocytes Relative 09/29/2017 12  % Final  . Monocytes Absolute 09/29/2017 1.1* 0.2 - 0.9 K/uL Final  . Eosinophils Relative 09/29/2017 4  % Final  . Eosinophils Absolute 09/29/2017 0.3  0 - 0.7 K/uL Final  . Basophils Relative 09/29/2017 2  % Final  . Basophils Absolute 09/29/2017 0.2* 0 - 0.1 K/uL Final   Performed at [facility], [address] [place], [place] [address]  . Troponin I 09/29/2017 <0.03  <0.03 ng/mL Final   Performed at [facility], [address] [place], [place] [address]  . Lactic Acid, Venous 09/29/2017 1.1  0.5 - 1.9 mmol/L Final   Performed at [facility], [address] [place], [place] [address]  . Lactic Acid, Venous  09/29/2017 0.8  0.5 - 1.9 mmol/L Final   Performed at [facility], [address] [place],  [address]  Assessment:  Mary Villarreal is a 85 y.o. female with thrombocytosis and anemia since 01/31/2017.  Platelet count has ranged between 671,000 - 896,000.  Hemoglobin has ranged from 10.5 - 11.8.  RBC indices have been macrocytic since 04/26/2018.  She is a vegetarian. She denies any melena, hematochezia, hematuria or vaginal bleeding.  She denies pica.  Labs on on 04/18/2018 and 04/26/2018 revealed a normal creatinine and LFTs.  Sed rate and CRP were normal.  Iron saturation was 22% with a TIBC of 493.8 (high).  B12 was 284 (low) on 03/25/2015.  TSH was 1.279 on 03/25/2015.  She has had declining vision in the left eye x 6 weeks.  She has corneal edema per ophthalmology.  She has pulsing discomfort in the left temporal artery.  Symptomatically, she has had an acute decline in left side vision.  She denies any B symptoms.  Exam reveals no hepatosplenomegaly.  Plan: 1.   Labs today:  CBC with diff, retic, B12, folate, TSH, ferritin, iron studies, sed rate, CRP, JAK2 with reflex, BCR-ABL. 2.   Peripheral smear for path review. 3.   Thrombocytosis  Patient has had platelet count ranging from 671,000 - 896,000 without trend since 01/2017.  Etiology may be reactive and associated with inflammation or iron deficiency.  Differential also includes a myeloproliferative disorder.  Incidental notation of increased monocytes > 1000 on several checks  Discuss laboratory work-up. 4.   Anemia  Patient has had progressive anemia since 01/2017.  Anemia likely secondary to iron deficiency   Patient is a vegetarian and eats no iron rich foods.   She has never had a colonoscopy although denies any change in stool.  Recent RBCs are macrocytic   Labs from 03/25/2015 revealed low B12.   Patient takes a multi-vitamin.  Discuss laboratory work-up. 5.  Temporal artery pain and change in  vision  Concern for possible temporal arteritis however ESR and CRP were normal at last check.  Discuss consideration of referral to rheumatology.  No evidence of retinal arterial thrombosis per opthalmology. 6.  RTC in 1 week for review of labs and discussion regarding direction of therapy.   Lequita Asal, MD  05/03/2018, 10:26 AM

## 2018-05-04 ENCOUNTER — Telehealth: Payer: Self-pay

## 2018-05-04 LAB — ANA W/REFLEX: Anti Nuclear Antibody(ANA): NEGATIVE

## 2018-05-04 NOTE — Telephone Encounter (Signed)
Informed patient of B12 results. Advised per Dr. Mike Gip, patient needs to start Vitamin B12 1,000 MCG Daily. Patient verbalizes understanding and denies any further questions.

## 2018-05-04 NOTE — Telephone Encounter (Signed)
-----   Message from Lequita Asal, MD sent at 05/04/2018  8:35 AM EDT ----- Regarding: Please call patient  B12 is low.  Start oral B12 1000 mcg a day.  M ----- Message ----- From: Buel Ream, Lab In [place] Sent: 05/03/2018  11:24 AM EDT To: Lequita Asal, MD

## 2018-05-06 ENCOUNTER — Other Ambulatory Visit: Payer: Self-pay

## 2018-05-06 ENCOUNTER — Telehealth: Payer: Self-pay | Admitting: Hematology and Oncology

## 2018-05-06 ENCOUNTER — Telehealth: Payer: Self-pay

## 2018-05-06 DIAGNOSIS — Z1589 Genetic susceptibility to other disease: Secondary | ICD-10-CM

## 2018-05-06 LAB — JAK2 V617F, W REFLEX TO CALR/E12/MPL

## 2018-05-06 NOTE — Telephone Encounter (Signed)
Spoke with Mary Villarreal to inform her that her JAK - 2 was (+) and per [name] he would like for the patient to get a Bone marrow bx. The patient was understanding and agreeable to go forward with the bone marrow bx. I have explained to the patient to be looking for a call from our scheduling team from at [facility] For appointment time and date to be schedule.

## 2018-05-06 NOTE — Telephone Encounter (Signed)
Re:  + JAK2  I spoke to the patient about recent test result + JAK2 indicative of a myeloproliferative disorder.  Discussed plan for bone marrow aspirate and biopsy with interventional radiology at [facility].  The procedure was described in detail.  Risks and benefits were reviewed.  The patient was agreeable for the procedure.   Lequita Asal, MD

## 2018-05-09 ENCOUNTER — Telehealth: Payer: Self-pay

## 2018-05-09 NOTE — Telephone Encounter (Signed)
Left a message on the patient voice mail to inform her that she has a Bone marrow bx at 7:30 AM for a 8:30 appointment. IR nurse will give the patient a call by the end of the week to explain what the patient will need to do before the appointment. I have also informed the patient if she has any question , please feel free to contact our office.

## 2018-05-10 ENCOUNTER — Other Ambulatory Visit: Payer: Self-pay

## 2018-05-10 ENCOUNTER — Encounter: Payer: Self-pay | Admitting: Hematology and Oncology

## 2018-05-10 ENCOUNTER — Ambulatory Visit: Payer: Self-pay | Admitting: Surgery

## 2018-05-10 ENCOUNTER — Inpatient Hospital Stay (HOSPITAL_BASED_OUTPATIENT_CLINIC_OR_DEPARTMENT_OTHER): Payer: Medicare Other | Admitting: Hematology and Oncology

## 2018-05-10 VITALS — BP 126/73 | HR 72 | Temp 98.3°F | Resp 18 | Wt 134.9 lb

## 2018-05-10 DIAGNOSIS — D649 Anemia, unspecified: Secondary | ICD-10-CM

## 2018-05-10 DIAGNOSIS — D473 Essential (hemorrhagic) thrombocythemia: Secondary | ICD-10-CM | POA: Diagnosis not present

## 2018-05-10 DIAGNOSIS — Z1589 Genetic susceptibility to other disease: Secondary | ICD-10-CM

## 2018-05-10 DIAGNOSIS — D7589 Other specified diseases of blood and blood-forming organs: Secondary | ICD-10-CM

## 2018-05-10 DIAGNOSIS — I10 Essential (primary) hypertension: Secondary | ICD-10-CM

## 2018-05-10 DIAGNOSIS — D75839 Thrombocytosis, unspecified: Secondary | ICD-10-CM

## 2018-05-10 DIAGNOSIS — Z87891 Personal history of nicotine dependence: Secondary | ICD-10-CM

## 2018-05-10 DIAGNOSIS — D539 Nutritional anemia, unspecified: Secondary | ICD-10-CM

## 2018-05-10 DIAGNOSIS — E538 Deficiency of other specified B group vitamins: Secondary | ICD-10-CM | POA: Diagnosis not present

## 2018-05-10 DIAGNOSIS — I2510 Atherosclerotic heart disease of native coronary artery without angina pectoris: Secondary | ICD-10-CM

## 2018-05-10 LAB — BCR-ABL1 FISH
Cells Analyzed: 200
Cells Counted: 200

## 2018-05-10 NOTE — Progress Notes (Signed)
[facility] day:  05/10/2018  Chief Complaint: Mary Villarreal is a 85 y.o. female with abnormal laboratory findings who is seen for review of work-up and discussion regarding direction of therapy.  HPI:  The patient was last seen in the hematology clinic on 05/03/2018 for initial consultation.   She had thrombocytosis and anemia since 01/31/2017.  Platelet count had ranged between 671,000 - 896,000.  Hemoglobin had ranged from 10.5 - 11.8.  RBC indices had been macrocytic since 04/26/2018.  She was a vegetarian.  Work-up revealed a hematocrit of 31.5, hemoglobin 10.1, MCV 100.3, platelets 183,000, white count 7200 with an ANC of 4900.  JAK2 V617F was positive.  BCR-ABL is pending.  ANA was negative.  TSH was 1.586.  Folate was 39.0.  B12 was 272.  Ferritin was 35.  Iron saturation was 27% with a TIBC of 436.  Reticulocyte count was 1.5%.  CRP was less than 0.8 and a sedimentation rate of 21.  He was contacted on 05/04/2018 secondary to a low B12.  Oral B12 was initiated.  She was contacted on 05/06/2018 regarding the positive JAK2 V617F for bone marrow aspirate and biopsy.   She saw Dr. Lysle Pearl, surgeon, for temporal pain and visual changes on 05/09/2018.  Case was discussed with Dr Jefm Bryant.  The chance of giant cell arteritis was felt low, but biopsy of the left temporal artery was felt indicated due to the more severe consequences of a delayed diagnosis.  She was given an prescription for prednisone 60 mg a day.  She plans to pick up the prescription today.  Surgery is scheduled for 05/13/2018.  During the interim, she feels about the same.  She continues to have left temporal tenderness and decreased vision.  She notes that her vision is worsening and "moving to the right side".   Past Medical History:  Diagnosis Date  . Aortic aneurysm, abdominal ([place])   . Bladder prolapse, female, acquired   . Coronary artery disease   . Hyperlipidemia   .  Hypertension   . TIA (transient ischemic attack)     Past Surgical History:  Procedure Laterality Date  . DG GALL BLADDER      History reviewed. No pertinent family history.  Social History:  reports that she has quit smoking. She has a 80.00 pack-year smoking history. She has never used smokeless tobacco. She reports current alcohol use of about 2.0 standard drinks of alcohol per week. She reports that she does not use drugs.  The patient has smoked 2 ppd x 40 years (80 pack year smoking history).  She stopped smoking at the age of 89.  She drinks red wine nightly (1-1.5 glasses).  The patient is widowed.    She has dual citizenship with [place] and the [employer].  She is from [place].  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  She lives in [place]. The patient is alone today.  Allergies: No Known Allergies  Current Medications: Current Outpatient Medications  Medication Sig Dispense Refill  . Ascorbic Acid (VITAMIN C) 100 MG tablet Take 100 mg by mouth daily.    Marland Kitchen CALCIUM PO Take 1 tablet by mouth daily.     Marland Kitchen diltiazem (CARDIZEM CD) 240 MG 24 hr capsule Take 240 mg by mouth daily.    Marland Kitchen  docusate sodium (COLACE) 100 MG capsule Take 100 mg by mouth daily as needed for mild constipation.    Marland Kitchen glucosamine-chondroitin 500-400 MG tablet Take 2 tablets by mouth daily.    Marland Kitchen losartan (COZAAR) 50 MG tablet Take 1 tablet by mouth 2 (two) times daily.  1  . Multiple Vitamins-Minerals (MULTIVITAMIN ADULT PO) Take 1 tablet by mouth daily.     . naproxen sodium (ALEVE) 220 MG tablet Take 220 mg by mouth 2 (two) times daily.    . simvastatin (ZOCOR) 20 MG tablet Take 20 mg by mouth daily.    . vitamin B-12 (CYANOCOBALAMIN) 1000 MCG tablet Take 1,000 mcg by mouth daily.    . Vitamin D, Ergocalciferol, 2000 units CAPS Take 1 capsule by  mouth daily.     Marland Kitchen aspirin 81 MG chewable tablet Chew 81 mg by mouth daily.     . predniSONE (DELTASONE) 20 MG tablet Take by mouth.     No current facility-administered medications for this visit.     Review of Systems:  GENERAL:  Feels "the same".  No fevers, sweats.  Weight down 4 pounds. PERFORMANCE STATUS (ECOG):  1 HEENT:  Visual changes in both eyes (left >> right).  Chronic runny nose secondary to deviated septum.  No sore throat, mouth sores or tenderness. Lungs: No shortness of breath or cough.  No hemoptysis. Cardiac:  No chest pain, palpitations, orthopnea, or PND. GI:  No nausea, vomiting, diarrhea, constipation, melena or hematochezia.  Never had a colonoscopy. GU:  Vaginal prolapse.  No urgency, frequency, dysuria, or hematuria. Musculoskeletal:  Degenerative disk disease.  Arthritis in hands.  Knee issues.  No muscle tenderness. Extremities:  No pain or swelling. Skin:  No rashes or skin changes. Neuro:  No headache, numbness or weakness, balance or coordination issues. Endocrine:  No diabetes, thyroid issues, hot flashes or night sweats. Psych:  Stress associated with sister's Alzheimer's.  No mood changes, depression or anxiety. Pain:  No focal pain. Review of systems:  All other systems reviewed and found to be negative.   Physical Exam: Blood pressure 126/73, pulse 72, temperature 98.3 F (36.8 C), temperature source Tympanic, resp. rate 18, weight 134 lb 14.7 oz (61.2 kg), SpO2 99 %. GENERAL:  Well developed, well nourished, woman sitting comfortably in the exam room in no acute distress. MENTAL STATUS:  Alert and oriented to person, place and time. HEAD:  Short styled gray hair.  Normocephalic, atraumatic, face symmetric, no Cushingoid features. EYES:  Glasses.  Blue eyes.  No conjunctivitis or scleral icterus.  NEUROLOGICAL: Unremarkable. PSYCH:  Appropriate.    No visits with results within 3 Day(s) from this visit.  Latest known visit with results is:   Office Visit on 05/03/2018  Component Date Value Ref Range Status  . Anti Nuclear Antibody(ANA) 05/03/2018 Negative  Negative Final   Comment: (NOTE) Performed At: [facility] [place], [place] [id number] Rush Farmer MD DG:[id number]   . JAK2 GenotypR 05/03/2018 Comment*  Final   Comment: (NOTE) Result:  POSITIVE for the detection of the V617F mutation. Interpretation:  The assay detected the presence of a G to T nucleotide change encoding the V617F mutation within JAK2. Interpretation of this result should be made in the context of other clinical, morphologic, and cytogenetic findings.   Marland Kitchen BACKGROUND: 05/03/2018 Comment   Final   Comment: (NOTE) JAK2 is a cytoplasmic tyrosine kinase with a key role in signal transduction from multiple hematopoietic growth factor receptors. A point mutation  within exon 14 of the JAK2 gene ([id number]) encoding a valine to phenylalanine substitution at position 617 of the JAK2 protein (V617F) has been identified in most patients with polycythemia vera, and in about half of those with either essential thrombocythemia or idiopathic myelofibrosis. The V617F has also been detected, although infrequently, in other myeloid disorders such as chronic myelomonocytic leukemia and chronic neutrophilic luekemia. V617F is an acquired mutation that alters a highly conserved valine present in the negative regulatory JH2 domain of the JAK2 protein and is predicted to dysregulate kinase activity. Methodology: Total genomic DNA was extracted and subjected to TaqMan real-time PCR amplification/detection. Two amplification products per sample were monitored by real-time PCR using primers/probes s                          pecific to JAK2 wild type (WT) and JAK2 mutant V617F. The ABI7900 Absolute Quantitation software will compare the patient specimen valuse to the standard curves and generate percent values for wild type and mutant type. In  vitro studies have indicated that this assay has an analytical sensitivity of 1%. References: Baxter EJ, Scott Phineas Real, et al. Acquired mutation of the tyrosine kinase JAK2 in human myeloproliferative disorders. Lancet. 2005 Mar 19-25; 365(9464):1054-1061. Alfonso Ramus Couedic JP. A unique clonal JAK2 mutation leading to constitutive signaling causes polycythaemia vera. Nature. 2005 Apr 28; 434(7037):1144-1148. Kralovics R, Passamonti F, Buser AS, et al. A gain-of-function mutation of JAK2 in myeloproliferative disorders. N Engl J Med. 2005 Apr 28; 352(17):1779-1790.   . Director Review, JAK2 05/03/2018 Comment   Final   Comment: (NOTE) Constance Goltz, PhD, [facility]               Director, [place] [facility] [place], [place]               [phone] This test was developed and its performance characteristics determined by [facility]. It has not been cleared or approved by the Food and Drug Administration.   Marland Kitchen REFLEX: 05/03/2018 Comment   Final   Comment: (NOTE) Reflex to CALR Mutation Analysis, JAK2 Exon 12-15 Mutation Analysis, and MPL Mutation Analysis is not indicated.   Marland Kitchen Extraction 05/03/2018 Completed   Corrected   Comment: (NOTE) Performed At: [facility] [address] [place], [place] [id number] Nechama Guard MD OZ:[id number] Performed At: [facility] [place], [place] [id number] Nechama Guard MD KG:[id number]   . TSH 05/03/2018 1.586  0.350 - 4.500 uIU/mL Final   Comment: Performed by a 3rd Generation assay with a functional sensitivity of <=0.01 uIU/mL. Performed at [facility], [address], [place], [place] [address]   . Folate 05/03/2018 39.0  >5.9 ng/mL Final   Performed at [facility], [address] [place], [place] [address]  . Vitamin B-12 05/03/2018 272  180 - 914 pg/mL Final    Comment: (NOTE) This assay is not validated for testing neonatal or myeloproliferative syndrome specimens for Vitamin B12 levels. Performed at [facility], [place] [address] [place], [place] [address]   . Retic Ct Pct 05/03/2018 1.5  0.4 - 3.1 % Final  . RBC.  05/03/2018 3.19* 3.87 - 5.11 MIL/uL Final  . Retic Count, Absolute 05/03/2018 48.2  19.0 - 186.0 K/uL Final  . Immature Retic Fract 05/03/2018 22.7* 2.3 - 15.9 % Final   Performed at [facility], [address], [place], [place] [address]  . Iron 05/03/2018 117  28 - 170 ug/dL Final  . TIBC 05/03/2018 436  250 - 450 ug/dL Final  . Saturation Ratios 05/03/2018 27  10.4 - 31.8 % Final  . UIBC 05/03/2018 319  ug/dL Final   Performed at [facility], [address] [place], [place] [address]  . Ferritin 05/03/2018 35  11 - 307 ng/mL Final   Performed at [facility], [address] [place], [place] [address]  . CRP 05/03/2018 <0.8  <1.0 mg/dL Final   Performed at [place] [address] [place], [place] [address]  . Sed Rate 05/03/2018 21  0 - 22 mm/hr Final   Performed at [facility], [address] [place], [place] [address]  . WBC 05/03/2018 7.2  4.0 - 10.5 K/uL Final  . RBC 05/03/2018 3.14* 3.87 - 5.11 MIL/uL Final  . Hemoglobin 05/03/2018 10.1* 12.0 - 15.0 g/dL Final  . HCT 05/03/2018 31.5* 36.0 - 46.0 % Final  . MCV 05/03/2018 100.3* 80.0 - 100.0 fL Final  . MCH 05/03/2018 32.2  26.0 - 34.0 pg Final  . MCHC 05/03/2018 32.1  30.0 - 36.0 g/dL Final  . RDW 05/03/2018 18.6* 11.5 - 15.5 % Final  . Platelets 05/03/2018 583* 150 - 400 K/uL Final  . nRBC 05/03/2018 0.6* 0.0 - 0.2 % Final  . Neutrophils Relative % 05/03/2018 67  % Final  . Neutro Abs 05/03/2018 4.9  1.7 - 7.7 K/uL Final  . Lymphocytes Relative 05/03/2018 16  % Final  . Lymphs Abs 05/03/2018 1.2  0.7 - 4.0 K/uL Final  . Monocytes Relative 05/03/2018 13  % Final  . Monocytes Absolute 05/03/2018 0.9  0.1 -  1.0 K/uL Final  . Eosinophils Relative 05/03/2018 2  % Final  . Eosinophils Absolute 05/03/2018 0.2  0.0 - 0.5 K/uL Final  . Basophils Relative 05/03/2018 1  % Final  . Basophils Absolute 05/03/2018 0.1  0.0 - 0.1 K/uL Final  . Immature Granulocytes 05/03/2018 1  % Final  . Abs Immature Granulocytes 05/03/2018 0.04  0.00 - 0.07 K/uL Final   Performed at [facility], [address] [place], [place] [address]    Assessment:  Mary Villarreal is a 85 y.o. female with thrombocytosis and anemia since 01/31/2017.  Platelet count has ranged between 671,000 - 896,000.  Hemoglobin has ranged from 10.5 - 11.8.  RBC indices have been macrocytic since 04/26/2018.  She is a vegetarian. She denies any melena, hematochezia, hematuria or vaginal bleeding.  She denies pica.  Labs on on 04/18/2018 and 04/26/2018 revealed a normal creatinine and LFTs.  Sed rate and CRP were normal.  Iron saturation was 22% with a TIBC of 493.8 (high).  B12 was 284 (low) on 03/25/2015.  TSH was 1.279 on 03/25/2015.  Work-up on 05/03/2018 revealed a hematocrit of 31.5, hemoglobin 10.1, MCV 100.3, platelets 183,000, white count 7200 with an ANC of 4900.  JAK2 V617F was positive.  BCR-ABL is pending.  Normal studies included:  ANA, TSH, folate, CRP (< 0.8), and sed rate (21).  B12 was 272.  Ferritin was 35 with an iron saturation of 27% with a TIBC of 436.  Reticulocyte count was  1.5%.   Patient has B12 deficiency.  She started on oral B12  She has had declining vision in the left eye x 6 weeks.  She has corneal edema per optometry.  She has pulsing discomfort in the left temporal artery.  Symptomatically, she notes ongoing visual changes (left >> right).  She is scheduled for left temporal artery biopsy on 05/13/2018.  Plan: 1.   Thrombocytosis  Review work-up.  JAK2 + indicating a myeloproliferative disorder.   100% patients with polycythemia rubra vera (PV)   60-65% patients with essential thrombocytosis  (ET)   60-65% patients with primary myelofibrosis (PMF)  Discuss obtaining a baseline bone marrow for diagnosis and initiation of treatment.   Procedure described in detail.  Risks and benefits reviewed.  If patient has a diagnosis of ET or PV, platelet goal of <= 400,000. 2.   Anemia  Patient has had a progressive anemia since 01/2017.  Review work-up.  Ferritin 35 (low normal).  B12 low.  Folate and TSH normal.  Retic was inappropriately low for level of anemia.  Patient documented with B12 deficiency   Patient started on oral B12 on 05/04/2018.   Plan to check B12 in 1 month.  Some component of iron deficiency   Patient is a vegetarian and eats no iron rich foods.   She has never had a colonoscopy although denies any change in stool.    NO plan for oral iron at this time as can result in rapid rise in hemoglobin if patient has PV. 3.  Temporal artery pain and change in vision  Review sed rate and CRP low (unusual) for temporal arteritis.  Discuss plan of Dr. Lysle Pearl and Dr Jefm Bryant for temporal artery biopsy.  Patient plans to pick up steroids today.  Phone follow-up with Dr. Ellin Mayhew, optometrist, in [place] [phone]).  Message left. 4.   Schedule bone marrow aspirate and biopsy. 5.   RTC 10 days after bone marrow for MD assessment and review of marrow results.   Lequita Asal, MD  05/10/2018, 8:50 AM

## 2018-05-10 NOTE — H&P (Signed)
Subjective:   CC: temporal pain and vision changes  HPI:  Mary Villarreal is a 85 y.o. female who was referred by Sallee Lange, NP for evaluation of above. First noted left sided temporal pain along with worsening vision.  Seen by ophtho who diagnosed her with corneal edema, started on steroid drops.  This did not improve symptoms.  Discussed with hematology and rheum about possible GCA, but ESR normal.  Despite this, vision is continuing to worsen and now she is complaining of similar symptoms on right side, including worsening vision.      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  Past Surgical History:  has a past surgical history that includes Cardiac catheterization (2007) and Cholecystectomy.  Family History: family history includes Alzheimer's disease in her father and sister; High blood pressure (Hypertension) in her mother and sister; Myocardial Infarction (Heart attack) in her father; Stroke in her mother.  Social History:  reports that she quit smoking about 28 years ago. Her smoking use included cigarettes. She has a 100.00 pack-year smoking history. She has never used smokeless tobacco. She reports current alcohol use. She reports that she does not use drugs.  Current Medications: has a current medication list which includes the following prescription(s): ascorbic acid, aspirin, betamethasone valerate, calcium, diltiazem, docusate  sodium, ergocalciferol (vitamin d2), glucosamine hcl/chondroitin su, hydrocortisone, losartan, multivitamin, naproxen sodium, prednisolone acetate, simvastatin, and prednisone.  Allergies:  No Known Allergies  ROS:  A 15 point review of systems was performed and pertinent positives and negatives noted in HPI   Objective:   BP 160/79   Pulse 72   Ht 142.2 cm (_0 )   Wt 61.1 kg (134 lb 11.2 oz)   BMI 30.20 kg/m   Constitutional :  alert, appears stated age, cooperative and no distress  Lymphatics/Throat:  no asymmetry, masses, or scars  Respiratory:  clear to auscultation bilaterally  Cardiovascular:  RRR. murmur present  Gastrointestinal: soft, non-tender; bowel sounds normal; no masses,  no organomegaly.    Musculoskeletal: Steady gait and movement  Skin: Cool and moist, non -palpable temporal arteries bilaterally on exam today.  Psychiatric: Normal affect, non-agitated, not confused       LABS:   ESR- 13m/hr  CRP- <155mL  RADS: n/a  Assessment:     Temporal pain bilateral and associated vision changes, concerning for possible Temporal arteritis (CMS-HCC) [M31.6]   Thoracic aneurysm CAD CKD stage III  Plan:   Despite the normal labs, and treatment for the corneal edema noted on last ophthalmology exam, patient continues to complaining of worsening vision changes now present on the right side as well.  Possibility of GCA at this point is high enough to warrant a biopsy for further diagnostic purposes.  Risk benefits alternatives discussed.  Risks include bleeding, infection, poor cosmesis, alternative includes continued monitoring and treatment of symptoms as needed but highly advised against this.  Benefits include potential diagnosis that can explain her present symptoms.  Risk of anesthesia includes stroke, PE, and/or death.   Briefly discussed case with rheumatologist, Dr. KeJefm Bryantand he recommended initiating steroid treatment while awaiting  biopsy results.  He stated that overall he still believes that the chance of GCA is low, but agrees with proceeding with biopsy of the left side due to the more severe consequences of a delayed diagnosis.  He stated if the biopsies come back positive he will be more than happy to continue to monitor and treat the patient.  We will start her on steroids as discussed.  And also reach out to her primary care to closely monitor her glucose levels while on treatment.  Scheduled for biopsy in the next couple days in the operating room under monitored anesthesia care.  We will try to minimize anesthetic effect secondary to her associated comorbidities that place her at a higher risk of anesthesia related complications.  Patient verbalized understanding and all questions and concerns were addressed at this time.  Wishes to proceed at this time       Electronically signed by Benjamine Sprague, DO on 05/09/2018 12:17 PM

## 2018-05-10 NOTE — H&P (View-Only) (Signed)
Subjective:   CC: temporal pain and vision changes  HPI:  Mary Villarreal is a 85 y.o. female who was referred by Sallee Lange, NP for evaluation of above. First noted left sided temporal pain along with worsening vision.  Seen by ophtho who diagnosed her with corneal edema, started on steroid drops.  This did not improve symptoms.  Discussed with hematology and rheum about possible GCA, but ESR normal.  Despite this, vision is continuing to worsen and now she is complaining of similar symptoms on right side, including worsening vision.      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  Past Surgical History:  has a past surgical history that includes Cardiac catheterization (2007) and Cholecystectomy.  Family History: family history includes Alzheimer's disease in her father and sister; High blood pressure (Hypertension) in her mother and sister; Myocardial Infarction (Heart attack) in her father; Stroke in her mother.  Social History:  reports that she quit smoking about 28 years ago. Her smoking use included cigarettes. She has a 100.00 pack-year smoking history. She has never used smokeless tobacco. She reports current alcohol use. She reports that she does not use drugs.  Current Medications: has a current medication list which includes the following prescription(s): ascorbic acid, aspirin, betamethasone valerate, calcium, diltiazem, docusate  sodium, ergocalciferol (vitamin d2), glucosamine hcl/chondroitin su, hydrocortisone, losartan, multivitamin, naproxen sodium, prednisolone acetate, simvastatin, and prednisone.  Allergies:  No Known Allergies  ROS:  A 15 point review of systems was performed and pertinent positives and negatives noted in HPI   Objective:   BP 160/79   Pulse 72   Ht 142.2 cm (_0 )   Wt 61.1 kg (134 lb 11.2 oz)   BMI 30.20 kg/m   Constitutional :  alert, appears stated age, cooperative and no distress  Lymphatics/Throat:  no asymmetry, masses, or scars  Respiratory:  clear to auscultation bilaterally  Cardiovascular:  RRR. murmur present  Gastrointestinal: soft, non-tender; bowel sounds normal; no masses,  no organomegaly.    Musculoskeletal: Steady gait and movement  Skin: Cool and moist, non -palpable temporal arteries bilaterally on exam today.  Psychiatric: Normal affect, non-agitated, not confused       LABS:   ESR- 50m/hr  CRP- <149mL  RADS: n/a  Assessment:     Temporal pain bilateral and associated vision changes, concerning for possible Temporal arteritis (CMS-HCC) [M31.6]   Thoracic aneurysm CAD CKD stage III  Plan:   Despite the normal labs, and treatment for the corneal edema noted on last ophthalmology exam, patient continues to complaining of worsening vision changes now present on the right side as well.  Possibility of GCA at this point is high enough to warrant a biopsy for further diagnostic purposes.  Risk benefits alternatives discussed.  Risks include bleeding, infection, poor cosmesis, alternative includes continued monitoring and treatment of symptoms as needed but highly advised against this.  Benefits include potential diagnosis that can explain her present symptoms.  Risk of anesthesia includes stroke, PE, and/or death.   Briefly discussed case with rheumatologist, Dr. KeJefm Bryantand he recommended initiating steroid treatment while awaiting  biopsy results.  He stated that overall he still believes that the chance of GCA is low, but agrees with proceeding with biopsy of the left side due to the more severe consequences of a delayed diagnosis.  He stated if the biopsies come back positive he will be more than happy to continue to monitor and treat the patient.  We will start her on steroids as discussed.  And also reach out to her primary care to closely monitor her glucose levels while on treatment.  Scheduled for biopsy in the next couple days in the operating room under monitored anesthesia care.  We will try to minimize anesthetic effect secondary to her associated comorbidities that place her at a higher risk of anesthesia related complications.  Patient verbalized understanding and all questions and concerns were addressed at this time.  Wishes to proceed at this time       Electronically signed by Benjamine Sprague, DO on 05/09/2018 12:17 PM

## 2018-05-10 NOTE — Progress Notes (Signed)
Pt here for follow up. Denies any concerns at this time.  

## 2018-05-11 ENCOUNTER — Telehealth: Payer: Self-pay

## 2018-05-11 NOTE — Telephone Encounter (Signed)
Spoke with Mary Villarreal to inform her that her appointment that she has requested to be moved from 05/16/18 to 05/19/18 at 7:30 AM for a 8:30 AM appointment has been approved. The patient was understanding and agreeable.

## 2018-05-12 ENCOUNTER — Other Ambulatory Visit: Payer: Self-pay

## 2018-05-12 ENCOUNTER — Encounter
Admission: RE | Admit: 2018-05-12 | Discharge: 2018-05-12 | Disposition: A | Discharge status: Home / Self Care | Payer: Medicare Other | Source: Ambulatory Visit | Attending: Surgery | Admitting: Surgery

## 2018-05-12 DIAGNOSIS — N183 Chronic kidney disease, stage 3 (moderate): Secondary | ICD-10-CM | POA: Diagnosis not present

## 2018-05-12 DIAGNOSIS — I716 Thoracoabdominal aortic aneurysm, without rupture: Secondary | ICD-10-CM | POA: Diagnosis not present

## 2018-05-12 DIAGNOSIS — I10 Essential (primary) hypertension: Secondary | ICD-10-CM

## 2018-05-12 DIAGNOSIS — R51 Headache: Secondary | ICD-10-CM | POA: Diagnosis present

## 2018-05-12 DIAGNOSIS — Z791 Long term (current) use of non-steroidal anti-inflammatories (NSAID): Secondary | ICD-10-CM | POA: Diagnosis not present

## 2018-05-12 DIAGNOSIS — Z79899 Other long term (current) drug therapy: Secondary | ICD-10-CM | POA: Diagnosis not present

## 2018-05-12 DIAGNOSIS — I129 Hypertensive chronic kidney disease with stage 1 through stage 4 chronic kidney disease, or unspecified chronic kidney disease: Secondary | ICD-10-CM | POA: Diagnosis not present

## 2018-05-12 DIAGNOSIS — Z7952 Long term (current) use of systemic steroids: Secondary | ICD-10-CM | POA: Diagnosis not present

## 2018-05-12 DIAGNOSIS — Z7982 Long term (current) use of aspirin: Secondary | ICD-10-CM | POA: Diagnosis not present

## 2018-05-12 DIAGNOSIS — E785 Hyperlipidemia, unspecified: Secondary | ICD-10-CM | POA: Diagnosis not present

## 2018-05-12 DIAGNOSIS — I2510 Atherosclerotic heart disease of native coronary artery without angina pectoris: Secondary | ICD-10-CM | POA: Diagnosis not present

## 2018-05-12 DIAGNOSIS — Z8673 Personal history of transient ischemic attack (TIA), and cerebral infarction without residual deficits: Secondary | ICD-10-CM | POA: Diagnosis not present

## 2018-05-12 DIAGNOSIS — Z823 Family history of stroke: Secondary | ICD-10-CM | POA: Diagnosis not present

## 2018-05-12 DIAGNOSIS — E559 Vitamin D deficiency, unspecified: Secondary | ICD-10-CM | POA: Diagnosis not present

## 2018-05-12 DIAGNOSIS — Z87891 Personal history of nicotine dependence: Secondary | ICD-10-CM | POA: Diagnosis not present

## 2018-05-12 DIAGNOSIS — Z01818 Encounter for other preprocedural examination: Secondary | ICD-10-CM | POA: Insufficient documentation

## 2018-05-12 HISTORY — DX: Other abnormalities of gait and mobility: R26.89

## 2018-05-12 HISTORY — DX: Unspecified visual disturbance: H53.9

## 2018-05-12 HISTORY — DX: Aortic aneurysm of unspecified site, without rupture: I71.9

## 2018-05-12 MED ORDER — CEFAZOLIN SODIUM-DEXTROSE 2-4 GM/100ML-% IV SOLN
2.0000 g | INTRAVENOUS | Status: AC
  Administered 2018-05-13: 2 g via INTRAVENOUS

## 2018-05-12 NOTE — Patient Instructions (Signed)
Your procedure is scheduled on: 05/13/2018 Fri at 8:30am Report to Same Day Surgery 2nd floor medical mall [facility] Entrance-take elevator on left to 2nd floor.  Check in with surgery information desk.)   Remember: Instructions that are not followed completely may result in serious medical risk, up to and including death, or upon the discretion of your surgeon and anesthesiologist your surgery may need to be rescheduled.    _x___ 1. Do not eat food after midnight the night before your procedure. You may drink clear liquids up to 2 hours before you are scheduled to arrive at the hospital for your procedure.  Do not drink clear liquids within 2 hours of your scheduled arrival to the hospital.  Clear liquids include  --Water or Apple juice without pulp  --Clear carbohydrate beverage such as ClearFast or Gatorade  --Black Coffee or Clear Tea (No milk, no creamers, do not add anything to                  the coffee or Tea Type 1 and type 2 diabetics should only drink water.   ____Ensure clear carbohydrate drink on the way to the hospital for bariatric patients  ____Ensure clear carbohydrate drink 3 hours before surgery for Dr Dwyane Luo patients if physician instructed.   No gum chewing or hard candies.     __x__ 2. No Alcohol for 24 hours before or after surgery.   __x__3. No Smoking or e-cigarettes for 24 prior to surgery.  Do not use any chewable tobacco products for at least 6 hour prior to surgery   ____  4. Bring all medications with you on the day of surgery if instructed.    __x__ 5. Notify your doctor if there is any change in your medical condition     (cold, fever, infections).    x___6. On the morning of surgery brush your teeth with toothpaste and water.  You may rinse your mouth with mouth wash if you wish.  Do not swallow any toothpaste or mouthwash.   Do not wear jewelry, make-up, hairpins, clips or nail polish.  Do not wear lotions, powders, or perfumes. You may wear  deodorant.  Do not shave 48 hours prior to surgery. Men may shave face and neck.  Do not bring valuables to the hospital.    [facility] is not responsible for any belongings or valuables.               Contacts, dentures or bridgework may not be worn into surgery.  Leave your suitcase in the car. After surgery it may be brought to your room.  For patients admitted to the hospital, discharge time is determined by your                       treatment team.  _  Patients discharged the day of surgery will not be allowed to drive home.  You will need someone to drive you home and stay with you the night of your procedure.    Please read over the following fact sheets that you were given:   [facility] Preparing for Surgery and or MRSA Information   _x___ Take anti-hypertensive listed below, cardiac, seizure, asthma,     anti-reflux and psychiatric medicines. These include:  1. diltiazem (CARDIZEM CD) 240 MG 24 hr capsule  2.  3.  4.  5.  6.  ____Fleets enema or Magnesium Citrate as directed.   _x___ Use CHG Soap or  sage wipes as directed on instruction sheet   ____ Use inhalers on the day of surgery and bring to hospital day of surgery  ____ Stop Metformin and Janumet 2 days prior to surgery.    ____ Take 1/2 of usual insulin dose the night before surgery and none on the morning     surgery.   _x___ Follow recommendations from Cardiologist, Pulmonologist or PCP regarding          stopping Aspirin, Coumadin, Plavix ,Eliquis, Effient, or Pradaxa, and Pletal.  X____Stop Anti-inflammatories such as Advil, Aleve, Ibuprofen, Motrin, Naproxen, Naprosyn, Goodies powders or aspirin products. OK to take Tylenol and                          Celebrex.   _x___ Stop supplements until after surgery.  But may continue Vitamin D, Vitamin B,       and multivitamin.   ____ Bring C-Pap to the hospital.

## 2018-05-13 ENCOUNTER — Encounter: Payer: Self-pay | Admitting: *Deleted

## 2018-05-13 ENCOUNTER — Ambulatory Visit: Payer: Medicare Other | Admitting: Certified Registered Nurse Anesthetist

## 2018-05-13 ENCOUNTER — Ambulatory Visit
Admission: RE | Admit: 2018-05-13 | Discharge: 2018-05-13 | Disposition: A | Discharge status: Home / Self Care | Payer: Medicare Other | Attending: Surgery | Admitting: Surgery

## 2018-05-13 ENCOUNTER — Other Ambulatory Visit: Payer: Self-pay

## 2018-05-13 ENCOUNTER — Encounter
Admission: RE | Disposition: A | Discharge status: Home / Self Care | Payer: Self-pay | Source: Home / Self Care | Attending: Surgery

## 2018-05-13 DIAGNOSIS — Z823 Family history of stroke: Secondary | ICD-10-CM | POA: Insufficient documentation

## 2018-05-13 DIAGNOSIS — N183 Chronic kidney disease, stage 3 (moderate): Secondary | ICD-10-CM | POA: Insufficient documentation

## 2018-05-13 DIAGNOSIS — I129 Hypertensive chronic kidney disease with stage 1 through stage 4 chronic kidney disease, or unspecified chronic kidney disease: Secondary | ICD-10-CM | POA: Insufficient documentation

## 2018-05-13 DIAGNOSIS — Z79899 Other long term (current) drug therapy: Secondary | ICD-10-CM | POA: Insufficient documentation

## 2018-05-13 DIAGNOSIS — E785 Hyperlipidemia, unspecified: Secondary | ICD-10-CM | POA: Insufficient documentation

## 2018-05-13 DIAGNOSIS — Z7952 Long term (current) use of systemic steroids: Secondary | ICD-10-CM | POA: Insufficient documentation

## 2018-05-13 DIAGNOSIS — I716 Thoracoabdominal aortic aneurysm, without rupture: Secondary | ICD-10-CM | POA: Insufficient documentation

## 2018-05-13 DIAGNOSIS — I2510 Atherosclerotic heart disease of native coronary artery without angina pectoris: Secondary | ICD-10-CM | POA: Insufficient documentation

## 2018-05-13 DIAGNOSIS — Z791 Long term (current) use of non-steroidal anti-inflammatories (NSAID): Secondary | ICD-10-CM | POA: Insufficient documentation

## 2018-05-13 DIAGNOSIS — Z7982 Long term (current) use of aspirin: Secondary | ICD-10-CM | POA: Insufficient documentation

## 2018-05-13 DIAGNOSIS — R51 Headache: Secondary | ICD-10-CM | POA: Insufficient documentation

## 2018-05-13 DIAGNOSIS — Z87891 Personal history of nicotine dependence: Secondary | ICD-10-CM | POA: Insufficient documentation

## 2018-05-13 DIAGNOSIS — E559 Vitamin D deficiency, unspecified: Secondary | ICD-10-CM | POA: Insufficient documentation

## 2018-05-13 DIAGNOSIS — Z8673 Personal history of transient ischemic attack (TIA), and cerebral infarction without residual deficits: Secondary | ICD-10-CM | POA: Insufficient documentation

## 2018-05-13 DIAGNOSIS — H539 Unspecified visual disturbance: Secondary | ICD-10-CM

## 2018-05-13 HISTORY — PX: ARTERY BIOPSY: SHX891

## 2018-05-13 SURGERY — BIOPSY TEMPORAL ARTERY
Anesthesia: Monitor Anesthesia Care | Site: Head | Laterality: Left

## 2018-05-13 MED ORDER — PROPOFOL 500 MG/50ML IV EMUL
INTRAVENOUS | Status: DC | PRN
  Administered 2018-05-13: 60 ug/kg/min via INTRAVENOUS

## 2018-05-13 MED ORDER — LIDOCAINE HCL (CARDIAC) PF 100 MG/5ML IV SOSY
PREFILLED_SYRINGE | INTRAVENOUS | Status: DC | PRN
  Administered 2018-05-13: 50 mg via INTRAVENOUS

## 2018-05-13 MED ORDER — HYDROCODONE-ACETAMINOPHEN 5-325 MG PO TABS: 1.0000 | ORAL_TABLET | Freq: Four times a day (QID) | ORAL | 0 refills | Status: AC | PRN

## 2018-05-13 MED ORDER — CHLORHEXIDINE GLUCONATE CLOTH 2 % EX PADS
6.0000 | MEDICATED_PAD | Freq: Once | CUTANEOUS | Status: AC
  Administered 2018-05-13: 6 via TOPICAL

## 2018-05-13 MED ORDER — OXYCODONE HCL 5 MG PO TABS: 5.0000 mg | ORAL_TABLET | Freq: Once | ORAL | Status: DC | PRN

## 2018-05-13 MED ORDER — LIDOCAINE-EPINEPHRINE 1 %-1:100000 IJ SOLN
INTRAMUSCULAR | Status: DC | PRN
  Administered 2018-05-13: 15 mL

## 2018-05-13 MED ORDER — FENTANYL CITRATE (PF) 100 MCG/2ML IJ SOLN
INTRAMUSCULAR | Status: AC
  Filled 2018-05-13: qty 2

## 2018-05-13 MED ORDER — FAMOTIDINE 20 MG PO TABS
20.0000 mg | ORAL_TABLET | Freq: Once | ORAL | Status: AC
  Administered 2018-05-13: 20 mg via ORAL

## 2018-05-13 MED ORDER — CEFAZOLIN SODIUM-DEXTROSE 2-4 GM/100ML-% IV SOLN
INTRAVENOUS | Status: AC
  Filled 2018-05-13: qty 100

## 2018-05-13 MED ORDER — LIDOCAINE HCL (PF) 2 % IJ SOLN
INTRAMUSCULAR | Status: AC
  Filled 2018-05-13: qty 10

## 2018-05-13 MED ORDER — OXYCODONE HCL 5 MG/5ML PO SOLN: 5.0000 mg | Freq: Once | ORAL | Status: DC | PRN

## 2018-05-13 MED ORDER — EPHEDRINE SULFATE 50 MG/ML IJ SOLN
INTRAMUSCULAR | Status: AC
  Filled 2018-05-13: qty 1

## 2018-05-13 MED ORDER — FENTANYL CITRATE (PF) 100 MCG/2ML IJ SOLN
INTRAMUSCULAR | Status: DC | PRN
  Administered 2018-05-13 (×4): 25 ug via INTRAVENOUS

## 2018-05-13 MED ORDER — DOCUSATE SODIUM 100 MG PO CAPS: 100.0000 mg | ORAL_CAPSULE | Freq: Two times a day (BID) | ORAL | 0 refills | Status: AC | PRN

## 2018-05-13 MED ORDER — IBUPROFEN 800 MG PO TABS: 800.0000 mg | ORAL_TABLET | Freq: Three times a day (TID) | ORAL | 0 refills | Status: DC | PRN

## 2018-05-13 MED ORDER — LACTATED RINGERS IV SOLN
INTRAVENOUS | Status: DC
  Administered 2018-05-13: 09:00:00 via INTRAVENOUS

## 2018-05-13 MED ORDER — PROPOFOL 10 MG/ML IV BOLUS
INTRAVENOUS | Status: AC
  Filled 2018-05-13: qty 20

## 2018-05-13 MED ORDER — FAMOTIDINE 20 MG PO TABS
ORAL_TABLET | ORAL | Status: AC
  Administered 2018-05-13: 20 mg via ORAL
  Filled 2018-05-13: qty 1

## 2018-05-13 MED ORDER — PHENYLEPHRINE HCL 10 MG/ML IJ SOLN
INTRAMUSCULAR | Status: AC
  Filled 2018-05-13: qty 1

## 2018-05-13 MED ORDER — PROPOFOL 10 MG/ML IV BOLUS
INTRAVENOUS | Status: DC | PRN
  Administered 2018-05-13: 10 mg via INTRAVENOUS
  Administered 2018-05-13: 30 mg via INTRAVENOUS

## 2018-05-13 MED ORDER — FENTANYL CITRATE (PF) 100 MCG/2ML IJ SOLN: 25.0000 ug | INTRAMUSCULAR | Status: DC | PRN

## 2018-05-13 MED ORDER — ACETAMINOPHEN 325 MG PO TABS: 650.0000 mg | ORAL_TABLET | Freq: Three times a day (TID) | ORAL | 0 refills | Status: DC | PRN

## 2018-05-13 SURGICAL SUPPLY — 40 items
BLADE SURG 15 STRL LF DISP TIS (BLADE) ×1 IMPLANT
BLADE SURG 15 STRL SS (BLADE) ×2
BLADE SURG SZ11 CARB STEEL (BLADE) ×3 IMPLANT
CNTNR SPEC 2.5X3XGRAD LEK (MISCELLANEOUS)
CONT SPEC 4OZ STER OR WHT (MISCELLANEOUS)
CONTAINER SPEC 2.5X3XGRAD LEK (MISCELLANEOUS) IMPLANT
COTTON BALL STRL MEDIUM (GAUZE/BANDAGES/DRESSINGS) ×3 IMPLANT
COVER WAND RF STERILE (DRAPES) ×3 IMPLANT
DERMABOND ADVANCED (GAUZE/BANDAGES/DRESSINGS) ×2
DERMABOND ADVANCED .7 DNX12 (GAUZE/BANDAGES/DRESSINGS) ×1 IMPLANT
DRAPE LAPAROTOMY 77X122 PED (DRAPES) ×3 IMPLANT
DRSG TELFA 4X3 1S NADH ST (GAUZE/BANDAGES/DRESSINGS) ×3 IMPLANT
ELECT CAUTERY BLADE 6.4 (BLADE) ×3 IMPLANT
ELECT REM PT RETURN 9FT ADLT (ELECTROSURGICAL) ×3
ELECTRODE REM PT RTRN 9FT ADLT (ELECTROSURGICAL) ×1 IMPLANT
GLOVE BIO SURGEON STRL SZ7 (GLOVE) ×3 IMPLANT
GOWN STRL REUS W/ TWL LRG LVL3 (GOWN DISPOSABLE) ×1 IMPLANT
GOWN STRL REUS W/ TWL XL LVL3 (GOWN DISPOSABLE) ×2 IMPLANT
GOWN STRL REUS W/TWL LRG LVL3 (GOWN DISPOSABLE) ×2
GOWN STRL REUS W/TWL XL LVL3 (GOWN DISPOSABLE) ×4
KIT TURNOVER KIT A (KITS) ×3 IMPLANT
LABEL OR SOLS (LABEL) ×3 IMPLANT
NEEDLE HYPO 25X1 1.5 SAFETY (NEEDLE) IMPLANT
NS IRRIG 500ML POUR BTL (IV SOLUTION) ×3 IMPLANT
PACK BASIN MINOR ARMC (MISCELLANEOUS) ×3 IMPLANT
SOL PREP PVP 2OZ (MISCELLANEOUS) ×3
SOLUTION PREP PVP 2OZ (MISCELLANEOUS) ×1 IMPLANT
SUCTION FRAZIER HANDLE 10FR (MISCELLANEOUS) ×2
SUCTION TUBE FRAZIER 10FR DISP (MISCELLANEOUS) ×1 IMPLANT
SUT MNCRL AB 4-0 PS2 18 (SUTURE) ×3 IMPLANT
SUT SILK 2 0 (SUTURE) ×2
SUT SILK 2-0 18XBRD TIE 12 (SUTURE) ×1 IMPLANT
SUT SILK 3 0 (SUTURE) ×2
SUT SILK 3-0 18XBRD TIE 12 (SUTURE) ×1 IMPLANT
SUT SILK 4 0 (SUTURE) ×2
SUT SILK 4-0 18XBRD TIE 12 (SUTURE) ×1 IMPLANT
SUT VIC AB 3-0 SH 27 (SUTURE) ×2
SUT VIC AB 3-0 SH 27X BRD (SUTURE) ×1 IMPLANT
SYR 10ML LL (SYRINGE) IMPLANT
SYR BULB IRRIG 60ML STRL (SYRINGE) ×3 IMPLANT

## 2018-05-13 NOTE — Transfer of Care (Signed)
Immediate Anesthesia Transfer of Care Note  Patient: Mary Villarreal  Procedure(s) Performed: BIOPSY TEMPORAL ARTERY LEFT (Left Head)  Patient Location: PACU  Anesthesia Type:General  Level of Consciousness: awake, alert  and oriented  Airway & Oxygen Therapy: Patient Spontanous Breathing  Post-op Assessment: Report given to RN and Post -op Vital signs reviewed and stable  Post vital signs: Reviewed and stable  Last Vitals:  Vitals Value Taken Time  BP 144/66 05/13/2018 11:24 AM  Temp 36.2 C 05/13/2018 11:24 AM  Pulse 65 05/13/2018 11:25 AM  Resp 21 05/13/2018 11:25 AM  SpO2 97 % 05/13/2018 11:25 AM  Vitals shown include unvalidated device data.  Last Pain:  Vitals:   05/13/18 0846  TempSrc: Oral  PainSc: 3          Complications: No apparent anesthesia complications

## 2018-05-13 NOTE — Discharge Instructions (Addendum)
AMBULATORY SURGERY  DISCHARGE INSTRUCTIONS   1) The drugs that you were given will stay in your system until tomorrow so for the next 24 hours you should not:  A) Drive an automobile B) Make any legal decisions C) Drink any alcoholic beverage   2) You may resume regular meals tomorrow.  Today it is better to start with liquids and gradually work up to solid foods.  You may eat anything you prefer, but it is better to start with liquids, then soup and crackers, and gradually work up to solid foods.   3) Please notify your doctor immediately if you have any unusual bleeding, trouble breathing, redness and pain at the surgery site, drainage, fever, or pain not relieved by medication.    4) Additional Instructions: OK TO RESUME ASPIRIN IN 48HRS AFTER PROCEDURE.  OK TO SHOWER IN 24HRS       Please contact your physician with any problems or Same Day Surgery at [phone], Monday through Friday 6 am to 4 pm, or [place] [facility] number at [phone].

## 2018-05-13 NOTE — OR Nursing (Signed)
Patient stated she didn't know she needed have anyone to stay with her for 24 hours after surgery and requests just local anesthesia for procedure.  When patients daughter in law arrived she stated that she would make sure someone stays with patient and that there is no problem.

## 2018-05-13 NOTE — Anesthesia Preprocedure Evaluation (Signed)
Anesthesia Evaluation  Patient identified by MRN, date of birth, ID band Patient awake    Reviewed: Allergy & Precautions, H&P , NPO status , Patient's Chart, lab work & pertinent test results  History of Anesthesia Complications Negative for: history of anesthetic complications  Airway Mallampati: III  TM Distance: <3 FB Neck ROM: full    Dental  (+) Upper Dentures, Lower Dentures   Pulmonary neg shortness of breath, former smoker,           Cardiovascular hypertension, (-) angina+ CAD       Neuro/Psych TIAnegative psych ROS   GI/Hepatic negative GI ROS, Neg liver ROS, neg GERD  ,  Endo/Other  negative endocrine ROS  Renal/GU Renal disease     Musculoskeletal   Abdominal   Peds  Hematology negative hematology ROS (+)   Anesthesia Other Findings Past Medical History: No date: Aortic aneurysm ([place]) No date: Aortic aneurysm, abdominal (HCC) No date: Balance problems No date: Bladder prolapse, female, acquired No date: Coronary artery disease No date: Hyperlipidemia No date: Hypertension No date: TIA (transient ischemic attack) No date: Vision changes  Past Surgical History: No date: DG GALL BLADDER     Reproductive/Obstetrics negative OB ROS                             Anesthesia Physical Anesthesia Plan  ASA: IV  Anesthesia Plan: MAC   Post-op Pain Management:    Induction: Intravenous  PONV Risk Score and Plan:   Airway Management Planned: Natural Airway and Nasal Cannula  Additional Equipment:   Intra-op Plan:   Post-operative Plan:   Informed Consent: I have reviewed the patients History and Physical, chart, labs and discussed the procedure including the risks, benefits and alternatives for the proposed anesthesia with the patient or authorized representative who has indicated his/her understanding and acceptance.     Dental Advisory Given  Plan Discussed  with: Anesthesiologist, CRNA and Surgeon  Anesthesia Plan Comments: (Patient consented for risks of anesthesia including but not limited to:  - adverse reactions to medications - damage to teeth, lips or other oral mucosa - sore throat or hoarseness - Damage to heart, brain, lungs or loss of life  Patient voiced understanding.)        Anesthesia Quick Evaluation

## 2018-05-13 NOTE — Anesthesia Post-op Follow-up Note (Signed)
Anesthesia QCDR form completed.        

## 2018-05-13 NOTE — Anesthesia Postprocedure Evaluation (Signed)
Anesthesia Post Note  Patient: Mary Villarreal  Procedure(s) Performed: BIOPSY TEMPORAL ARTERY LEFT (Left Head)  Patient location during evaluation: PACU Anesthesia Type: General Level of consciousness: awake and alert Pain management: pain level controlled Vital Signs Assessment: post-procedure vital signs reviewed and stable Respiratory status: spontaneous breathing, nonlabored ventilation, respiratory function stable and patient connected to nasal cannula oxygen Cardiovascular status: blood pressure returned to baseline and stable Postop Assessment: no apparent nausea or vomiting Anesthetic complications: no     Last Vitals:  Vitals:   05/13/18 1154 05/13/18 1201  BP:  (!) 143/58  Pulse:  68  Resp:  18  Temp: 36.6 C 36.8 C  SpO2:  97%    Last Pain:  Vitals:   05/13/18 1201  TempSrc: Temporal  PainSc: 0-No pain                 Precious Haws Piscitello

## 2018-05-13 NOTE — Interval H&P Note (Signed)
History and Physical Interval Note:  05/13/2018 9:32 AM  Mary Villarreal  has presented today for surgery, with the diagnosis of M31.6 TEMPORAL ARTHRITIS.  The various methods of treatment have been discussed with the patient and family. After consideration of risks, benefits and other options for treatment, the patient has consented to  Procedure(s): BIOPSY TEMPORAL ARTERY LEFT (Left) as a surgical intervention.  The patient's history has been reviewed, patient examined, no change in status, stable for surgery.  I have reviewed the patient's chart and labs.  Questions were answered to the patient's satisfaction.     Caryn Gienger Lysle Pearl

## 2018-05-15 ENCOUNTER — Encounter: Payer: Self-pay | Admitting: Surgery

## 2018-05-16 ENCOUNTER — Telehealth: Payer: Self-pay

## 2018-05-16 ENCOUNTER — Ambulatory Visit: Admission: RE | Admit: 2018-05-16 | Payer: Medicare Other | Source: Ambulatory Visit

## 2018-05-16 LAB — SURGICAL PATHOLOGY

## 2018-05-16 NOTE — Op Note (Signed)
Pre-Op Dx: Left temporal pain and vision changes Post-Op Dx: same Anesthesia: MAC EBL: minimal Complications:  none apparent Specimen: Left temporal artery Procedure: excisional biopsy of left temporal artery Surgeon: Lysle Pearl  Indication for procedure: Patient presenting with symptoms above concerning for possible temporal arteritis surgery requested for biopsy to aid in diagnosis.  Description of Procedure:  Patient willing to the OR, transferred to the OR table in supine position.  Preoperative antibiotics given.  Area sterilized and draped in usual position.  Timeout performed.   Local infused to area previously marked after confirming palpable temporal artery as well as using Doppler.  5cm incision made through dermis with 15blade and  the palpable temporal artery was noted overlying the fascia.  It was noted to be extremely tortuous and fragile.  Initial attempt at dissection resulted in a inadvertent injury to the artery itself, requiring suture ligation with 3-0 silk.  Due to the injury, the incision had to be extended a couple centimeters caudad, order to obtain a adequate specimen length which in the end resulted in roughly 3 cm.  The 2 ends were then clamped and then ligated and the specimen was passed off the operative field pending pathology.  2 ends were then suture ligated with 3-0 silk.  Prior to the removal of the biopsy specimen, branching arteries were also suture-ligated with 3-0 silk.  Wound hemostasis noted, then skin closed with running 4-0 monocryl in subcuticular fashion.  Wound then dressed with dermabond.  Pt tolerated procedure well, and transferred to PACU in stable condition. Sponge and instrument count correct at end of procedure.

## 2018-05-16 NOTE — Telephone Encounter (Signed)
-----   Message from Leighton Parody sent at 05/16/2018  8:36 AM EDT ----- Pt's son called, Ms. Coover wants to have her biopsy scheduled before Friday if that's possible. You can go ahead and either call her or her son Roderic Palau [phone].  Thank you!

## 2018-05-16 NOTE — Telephone Encounter (Signed)
Spoke with Ms Mary Villarreal to inform her that her Bx was still schedule for Thursday at 7:30 AM and some one will be calling her with extra information for as her producer. The patient was understanding and agreeable to keep schedule appointment , she feel like it is very importance.

## 2018-05-18 ENCOUNTER — Other Ambulatory Visit: Payer: Self-pay | Admitting: Radiology

## 2018-05-19 ENCOUNTER — Other Ambulatory Visit: Payer: Self-pay

## 2018-05-19 ENCOUNTER — Ambulatory Visit
Admission: RE | Admit: 2018-05-19 | Discharge: 2018-05-19 | Disposition: A | Discharge status: Home / Self Care | Payer: Medicare Other | Source: Ambulatory Visit | Attending: Hematology and Oncology | Admitting: Hematology and Oncology

## 2018-05-19 ENCOUNTER — Ambulatory Visit: Payer: Medicare Other

## 2018-05-19 ENCOUNTER — Ambulatory Visit: Admission: RE | Admit: 2018-05-19 | Payer: Medicare Other | Source: Ambulatory Visit

## 2018-05-19 ENCOUNTER — Other Ambulatory Visit (HOSPITAL_COMMUNITY)
Admission: RE | Admit: 2018-05-19 | Discharge: 2018-05-19 | Disposition: A | Discharge status: Home / Self Care | Payer: Medicare Other | Source: Ambulatory Visit | Attending: Hematology and Oncology | Admitting: Hematology and Oncology

## 2018-05-19 DIAGNOSIS — N8110 Cystocele, unspecified: Secondary | ICD-10-CM | POA: Insufficient documentation

## 2018-05-19 DIAGNOSIS — Z8673 Personal history of transient ischemic attack (TIA), and cerebral infarction without residual deficits: Secondary | ICD-10-CM | POA: Diagnosis not present

## 2018-05-19 DIAGNOSIS — D473 Essential (hemorrhagic) thrombocythemia: Secondary | ICD-10-CM | POA: Diagnosis not present

## 2018-05-19 DIAGNOSIS — E785 Hyperlipidemia, unspecified: Secondary | ICD-10-CM | POA: Diagnosis not present

## 2018-05-19 DIAGNOSIS — I2510 Atherosclerotic heart disease of native coronary artery without angina pectoris: Secondary | ICD-10-CM | POA: Insufficient documentation

## 2018-05-19 DIAGNOSIS — I10 Essential (primary) hypertension: Secondary | ICD-10-CM | POA: Diagnosis not present

## 2018-05-19 DIAGNOSIS — Z87891 Personal history of nicotine dependence: Secondary | ICD-10-CM | POA: Insufficient documentation

## 2018-05-19 DIAGNOSIS — D4989 Neoplasm of unspecified behavior of other specified sites: Secondary | ICD-10-CM | POA: Insufficient documentation

## 2018-05-19 DIAGNOSIS — K219 Gastro-esophageal reflux disease without esophagitis: Secondary | ICD-10-CM | POA: Insufficient documentation

## 2018-05-19 DIAGNOSIS — I714 Abdominal aortic aneurysm, without rupture: Secondary | ICD-10-CM | POA: Insufficient documentation

## 2018-05-19 DIAGNOSIS — R7989 Other specified abnormal findings of blood chemistry: Secondary | ICD-10-CM | POA: Insufficient documentation

## 2018-05-19 DIAGNOSIS — D649 Anemia, unspecified: Secondary | ICD-10-CM | POA: Diagnosis not present

## 2018-05-19 DIAGNOSIS — Z1589 Genetic susceptibility to other disease: Secondary | ICD-10-CM | POA: Diagnosis present

## 2018-05-19 DIAGNOSIS — C946 Myelodysplastic disease, not classified: Secondary | ICD-10-CM | POA: Insufficient documentation

## 2018-05-19 DIAGNOSIS — Z79899 Other long term (current) drug therapy: Secondary | ICD-10-CM | POA: Insufficient documentation

## 2018-05-19 DIAGNOSIS — Z7982 Long term (current) use of aspirin: Secondary | ICD-10-CM | POA: Insufficient documentation

## 2018-05-19 HISTORY — DX: Gastro-esophageal reflux disease without esophagitis: K21.9

## 2018-05-19 LAB — CBC WITH DIFFERENTIAL/PLATELET
Abs Immature Granulocytes: 0.18 10*3/uL — ABNORMAL HIGH (ref 0.00–0.07)
Basophils Absolute: 0 10*3/uL (ref 0.0–0.1)
Basophils Relative: 0 %
Eosinophils Absolute: 0.1 10*3/uL (ref 0.0–0.5)
Eosinophils Relative: 1 %
HCT: 33.1 % — ABNORMAL LOW (ref 36.0–46.0)
Hemoglobin: 10.5 g/dL — ABNORMAL LOW (ref 12.0–15.0)
Immature Granulocytes: 2 %
Lymphocytes Relative: 12 %
Lymphs Abs: 1.2 10*3/uL (ref 0.7–4.0)
MCH: 31.3 pg (ref 26.0–34.0)
MCHC: 31.7 g/dL (ref 30.0–36.0)
MCV: 98.8 fL (ref 80.0–100.0)
MONO ABS: 1.2 10*3/uL — AB (ref 0.1–1.0)
Monocytes Relative: 12 %
Neutro Abs: 7.1 10*3/uL (ref 1.7–7.7)
Neutrophils Relative %: 73 %
Platelets: 755 10*3/uL — ABNORMAL HIGH (ref 150–400)
RBC: 3.35 MIL/uL — ABNORMAL LOW (ref 3.87–5.11)
RDW: 19 % — ABNORMAL HIGH (ref 11.5–15.5)
WBC: 9.8 10*3/uL (ref 4.0–10.5)
nRBC: 0.4 % — ABNORMAL HIGH (ref 0.0–0.2)

## 2018-05-19 LAB — PROTIME-INR
INR: 1.1 (ref 0.8–1.2)
Prothrombin Time: 13.8 seconds (ref 11.4–15.2)

## 2018-05-19 MED ORDER — FENTANYL CITRATE (PF) 100 MCG/2ML IJ SOLN
INTRAMUSCULAR | Status: AC
  Filled 2018-05-19: qty 4

## 2018-05-19 MED ORDER — HEPARIN SOD (PORK) LOCK FLUSH 100 UNIT/ML IV SOLN
INTRAVENOUS | Status: AC
  Filled 2018-05-19: qty 5

## 2018-05-19 MED ORDER — FENTANYL CITRATE (PF) 100 MCG/2ML IJ SOLN
INTRAMUSCULAR | Status: AC | PRN
  Administered 2018-05-19: 25 ug via INTRAVENOUS
  Administered 2018-05-19: 50 ug via INTRAVENOUS

## 2018-05-19 MED ORDER — MIDAZOLAM HCL 5 MG/5ML IJ SOLN
INTRAMUSCULAR | Status: AC
  Filled 2018-05-19: qty 5

## 2018-05-19 MED ORDER — MIDAZOLAM HCL 5 MG/5ML IJ SOLN
INTRAMUSCULAR | Status: AC | PRN
  Administered 2018-05-19 (×3): 1 mg via INTRAVENOUS

## 2018-05-19 MED ORDER — SODIUM CHLORIDE 0.9 % IV SOLN
INTRAVENOUS | Status: DC
  Administered 2018-05-19: 1000 mL via INTRAVENOUS

## 2018-05-19 NOTE — Procedures (Signed)
BM Bx R iliac EBL 0 Comp 0 

## 2018-05-19 NOTE — H&P (Signed)
Chief Complaint: Patient was seen in consultation today for No chief complaint on file.  at the request of Takilma C  Referring Physician(s): Chelsea C  Supervising Physician: Marybelle Killings  Patient Status: [facility] - Out-pt  History of Present Illness: Mary Villarreal is a 85 y.o. female with thrombocytosis, anemia and visual changes.  She is referred for bone marrow biopsy.  Currently, she is asymptomatic.  She denies any fever, cough, or flulike symptoms.  Past Medical History:  Diagnosis Date  . Aortic aneurysm ([place])   . Aortic aneurysm, abdominal ([place])   . Balance problems   . Bladder prolapse, female, acquired   . Coronary artery disease   . GERD (gastroesophageal reflux disease)   . Hyperlipidemia   . Hypertension   . TIA (transient ischemic attack)   . Vision changes     Past Surgical History:  Procedure Laterality Date  . ARTERY BIOPSY Left 05/13/2018   Procedure: BIOPSY TEMPORAL ARTERY LEFT;  Surgeon: Benjamine Sprague, DO;  Location: [facility] ORS;  Service: General;  Laterality: Left;  . DG GALL BLADDER    . EYE SURGERY     cataracts    Allergies: Patient has no known allergies.  Medications: Prior to Admission medications   Medication Sig Start Date End Date Taking? Authorizing Provider  Ascorbic Acid (VITAMIN C) 100 MG tablet Take 100 mg by mouth daily.   Yes [provider]  aspirin 81 MG chewable tablet Chew 81 mg by mouth daily.    Yes [provider]  CALCIUM PO Take 1 tablet by mouth 2 (two) times daily.    Yes [provider]  diltiazem (CARDIZEM CD) 240 MG 24 hr capsule Take 240 mg by mouth daily.   Yes [provider]  docusate sodium (COLACE) 100 MG capsule Take 1 capsule (100 mg total) by mouth 2 (two) times daily as needed for up to 10 days for mild constipation. 05/13/18 05/23/18 Yes Sakai, Isami, DO  glucosamine-chondroitin 500-400 MG tablet Take 2 tablets by mouth daily.   Yes [provider]  ibuprofen (ADVIL,MOTRIN) 800 MG tablet Take 1 tablet (800 mg total) by mouth every 8 (eight) hours as needed for mild pain or moderate pain. 05/13/18  Yes Sakai, Isami, DO  losartan (COZAAR) 50 MG tablet Take 1 tablet by mouth 2 (two) times daily. 08/20/17  Yes [provider]  Multiple Vitamins-Minerals (MULTIVITAMIN ADULT PO) Take 1 tablet by mouth daily.    Yes [provider]  simvastatin (ZOCOR) 20 MG tablet Take 20 mg by mouth daily at 6 PM.    Yes [provider]  vitamin B-12 (CYANOCOBALAMIN) 1000 MCG tablet Take 1,000 mcg by mouth daily.   Yes [provider]  Vitamin D, Ergocalciferol, 2000 units CAPS Take 2,000 Units by mouth daily.    Yes [provider]  acetaminophen (TYLENOL) 325 MG tablet Take 2 tablets (650 mg total) by mouth every 8 (eight) hours as needed for up to 30 days for mild pain. Patient not taking: Reported on 05/19/2018 05/13/18 06/12/18  Benjamine Sprague, DO     History reviewed. No pertinent family history.  Social History   Socioeconomic History  . Marital status: Widowed    Spouse name: Not on file  . Number of children: Not on file  . Years of education: Not on file  . Highest education level: Not on file  Occupational History  . Not on file  Social Needs  . Financial resource strain: Not  on file  . Food insecurity:    Worry: Not on file    Inability: Not on file  . Transportation needs:    Medical: Not on file    Non-medical: Not on file  Tobacco Use  . Smoking status: Former Smoker    Packs/day: 2.00    Years: 40.00    Pack years: 80.00    Last attempt to quit: 05/11/1988    Years since quitting: 30.0  . Smokeless tobacco: Never Used  . Tobacco comment: stop smoking 40 yrs ago  Substance and Sexual Activity  . Alcohol use: Yes    Alcohol/week: 2.0 standard drinks    Types: 2 Glasses of wine per week    Comment: 14  . Drug use: No  . Sexual activity: Not on file  Lifestyle  . Physical  activity:    Days per week: Not on file    Minutes per session: Not on file  . Stress: Not on file  Relationships  . Social connections:    Talks on phone: Not on file    Gets together: Not on file    Attends religious service: Not on file    Active member of club or organization: Not on file    Attends meetings of clubs or organizations: Not on file    Relationship status: Not on file  Other Topics Concern  . Not on file  Social History Narrative  . Not on file     Review of Systems: A 12 point ROS discussed and pertinent positives are indicated in the HPI above.  All other systems are negative.  Review of Systems  Vital Signs: BP 134/75   Pulse 77   Temp 98.6 F (37 C) (Oral)   Resp (!) 26   SpO2 95%   Physical Exam Constitutional:      Appearance: Normal appearance.  HENT:     Head: Normocephalic and atraumatic.  Cardiovascular:     Rate and Rhythm: Normal rate and regular rhythm.  Pulmonary:     Effort: Pulmonary effort is normal.     Breath sounds: Normal breath sounds.  Neurological:     General: No focal deficit present.     Mental Status: She is alert and oriented to person, place, and time.     Imaging: No results found.  Labs:  CBC: Recent Labs    06/28/17 1002 09/29/17 1036 05/03/18 1110 05/19/18 0740  WBC 7.2 8.5 7.2 9.8  HGB 11.4* 10.7* 10.1* 10.5*  HCT 34.6* 32.9* 31.5* 33.1*  PLT 896* 869* 583* 755*    COAGS: Recent Labs    05/19/18 0740  INR 1.1    BMP: Recent Labs    06/28/17 1002 09/29/17 1036  NA 139 141  K 3.9 3.9  CL 103 109  CO2 26 22  GLUCOSE 99 106*  BUN 17 13  CALCIUM 9.4 9.1  CREATININE 0.91 0.93  GFRNONAA 56* 54*  GFRAA >60 >60    LIVER FUNCTION TESTS: Recent Labs    06/28/17 1002 09/29/17 1036  BILITOT 0.6 0.8  AST 27 29  ALT 13* 14  ALKPHOS 64 64  PROT 7.4 6.5  ALBUMIN 4.2 3.8    TUMOR MARKERS: No results for input(s): AFPTM, CEA, CA199, CHROMGRNA in the last 8760 hours.  Assessment  and Plan:  Thrombocytosis.  Bone marrow biopsy is to follow.  The procedure and risks were explained.  Her questions were answered.  Thank you for this interesting consult.  I greatly  enjoyed meeting Mary Villarreal and look forward to participating in their care.  A copy of this report was sent to the requesting provider on this date.  Electronically Signed: Art A Teniola Tseng, MD 05/19/2018, 8:23 AM   I spent a total of  40 Minutes   in face to face in clinical consultation, greater than 50% of which was counseling/coordinating care for bone marrow biopsy.

## 2018-05-19 NOTE — Discharge Instructions (Signed)

## 2018-05-19 NOTE — Progress Notes (Signed)
Patient clinically stable post Bone Marrow biopsy per Dr Barbie Banner, dressing c/d/i. Denies complaints. Taking po's without difficulty. Discharge instructions given to patient and Tianna over phone with questions answered.

## 2018-05-25 ENCOUNTER — Encounter (HOSPITAL_COMMUNITY): Payer: Self-pay | Admitting: Hematology and Oncology

## 2018-05-26 ENCOUNTER — Encounter: Payer: Self-pay | Admitting: Hematology and Oncology

## 2018-05-27 ENCOUNTER — Other Ambulatory Visit: Payer: Self-pay

## 2018-05-29 NOTE — Progress Notes (Signed)
[facility]  [address], [address] [place], [place] [address] Phone: [phone]  Fax: [phone]   Telephone Office Visit:  05/30/2018  I connected with Mary Villarreal on 05/30/18 at 9:10 AM EDT by telephone and verified that I was speaking with the correct person using 2 identifiers.  The patient was at home.  I discussed the limitations, risk, security and privacy concerns of performing an evaluation and management service by telephone and  the availability of in person appointments.  I also discussed with the patient that there may be a patient responsible charge related to this service.  The patient expressed understanding and agreed to proceed.    Chief Complaint: Mary Villarreal is a 85 y.o. female with thrombocytosis and +JAK2 testing who is seen for review of bone marrow and discussion regarding direction of therapy.  HPI:  The patient was last seen in the hematology clinic on 05/10/2018.   At that time, she noted ongoing visual changes (left >> right).  She was scheduled for left temporal artery biopsy on 05/13/2018.  We discussed plans for a bone marrow aspirate and biopsy for her JAK2+ disease.  Left temporal artery biopsy on 05/13/2018 revealed no evidence of giant cell arteritis.  Bone marrow on 05/19/2018 revealed a cellular marrow with a JAK2 positive myeloproliferative neoplasm.  The fragmented nature of the core biopsy, in combination with the positive spicular aspirate smears, hindered the assessment of this myeloproliferative neoplasm. There were increased megakaryocytes with atypical features including clustering, hyperchromatic, bulbous and hypolobated nuclei.  However, given the increased fibrosis (MF-1) and morphologic appearance of the megakaryocytes, early primary myelofibrosis (PMF) is favored over essential thrombocythemia (ET).  Flow cytometry was negative.  Cytogenetics were normal (69, XX).   Symptomatically, she denies any new  complaints.  She states that she was taken off her steroids after her temporal artery biopsy returned negative.  She has been referred to the [facility], but does not have an appointment because of the COVID-19 pandemic.  She is staying in isolation.  She states that her vision is not worse.  She feels that she has dry eyes.    She notes less pounding in the right temporal artery region for the past 2 weeks.  She has a little bit of a sensation on the right side.  She notes "overwhelming fatigue" and feeling draggy.  Instead of feeling 85 years old, she feels 89.  Her weight is stable.  She has had some more back issues as she has been unable to get her acupuncture.  She has known severe degenerative disc disease and is taking Aleve, and using ice and a heating pad.  Bone marrow site is unremarkable.   Past Medical History:  Diagnosis Date  . Aortic aneurysm ([place])   . Aortic aneurysm, abdominal ([place])   . Balance problems   . Bladder prolapse, female, acquired   . Coronary artery disease   . GERD (gastroesophageal reflux disease)   . Hyperlipidemia   . Hypertension   . TIA (transient ischemic attack)   . Vision changes     Past Surgical History:  Procedure Laterality Date  . ARTERY BIOPSY Left 05/13/2018   Procedure: BIOPSY TEMPORAL ARTERY LEFT;  Surgeon: Benjamine Sprague, DO;  Location: [facility] ORS;  Service: General;  Laterality: Left;  . DG GALL BLADDER    . EYE SURGERY     cataracts    History reviewed. No pertinent family history.  Social History:  reports that she quit smoking about 30 years ago. She has a 80.00 pack-year smoking history. She has never used smokeless tobacco. She reports current alcohol use of about 2.0 standard drinks of alcohol per week. She reports that she does not use drugs.  The patient has smoked 2 ppd x 40 years (80 pack year smoking history).  She stopped smoking at the age of 77.  She drinks red wine nightly (1-1.5 glasses).  The patient is widowed.    She  has dual citizenship with [place] and the [employer].  She is from [place].  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  She lives in [place].   Participants in the patient's visit included the patient and Waymon Budge, RN today.  The intake visit was provided by Waymon Budge, RN.   Allergies: No Known Allergies  Current Medications: Current Outpatient Medications  Medication Sig Dispense Refill  . Ascorbic Acid (VITAMIN C) 100 MG tablet Take 100 mg by mouth daily.    Marland Kitchen aspirin 81 MG chewable tablet Chew 81 mg by mouth daily.     Marland Kitchen CALCIUM PO Take 1 tablet by mouth 2 (two) times daily.     Marland Kitchen diltiazem (CARDIZEM CD) 240 MG 24 hr capsule Take 240 mg by mouth daily.    Marland Kitchen glucosamine-chondroitin 500-400 MG tablet Take 2 tablets by mouth daily.    Marland Kitchen losartan (COZAAR) 50 MG tablet Take 2 tablets by mouth 2 (two) times daily.   1  . Multiple Vitamins-Minerals (MULTIVITAMIN ADULT PO) Take 1 tablet by mouth daily.     . naproxen sodium (ALEVE) 220 MG tablet Take 220 mg by mouth daily as needed.    . simvastatin (ZOCOR) 20 MG tablet Take 20 mg by mouth daily at 6 PM.     . vitamin B-12 (CYANOCOBALAMIN) 1000 MCG tablet Take 1,000 mcg by mouth daily.    . Vitamin D, Ergocalciferol, 2000 units CAPS Take 2,000 Units by mouth daily.      No current facility-administered medications for this visit.     Review of Systems:  GENERAL:  Overwhelming fatigue.  Draggy.  No fevers, sweats or weight loss. PERFORMANCE STATUS (ECOG):  1 HEENT:  No change in vision (blurry left >> right).   Dry eyes.  Runny nose.  No sore throat, mouth sores or tenderness. Lungs:  No shortness of breath or cough.  No hemoptysis. Cardiac:  No chest pain, palpitations, orthopnea, or PND. GI:  No nausea, vomiting, diarrhea, constipation, melena or  hematochezia. GU:  Vaginal prolapse.  No urgency, frequency, dysuria, or hematuria. Musculoskeletal:  Degenerative disk disease, more panful as she has not had acupuncture.  Arthritis in hands.  No muscle tenderness. Extremities:  No pain or swelling. Skin:  No rashes or skin changes. Neuro:  No headache, numbness or weakness, balance or coordination issues. Endocrine:  No diabetes, thyroid issues, hot flashes or night sweats. Psych:  No mood changes, depression or anxiety. Pain:  Back pain due to DDD (using Aleve, heating pad, and ice). Review of systems:  All other systems reviewed and found to be negative.    No visits with results within 3 Day(s) from this visit.  Latest known visit with results is:  Hospital Outpatient Visit on 05/19/2018  Component Date Value Ref Range Status  . Prothrombin  Time 05/19/2018 13.8  11.4 - 15.2 seconds Final  . INR 05/19/2018 1.1  0.8 - 1.2 Final   Comment: (NOTE) INR goal varies based on device and disease states. Performed at [facility], [address], [place], [place] [address]   . WBC 05/19/2018 9.8  4.0 - 10.5 K/uL Final  . RBC 05/19/2018 3.35* 3.87 - 5.11 MIL/uL Final  . Hemoglobin 05/19/2018 10.5* 12.0 - 15.0 g/dL Final  . HCT 05/19/2018 33.1* 36.0 - 46.0 % Final  . MCV 05/19/2018 98.8  80.0 - 100.0 fL Final  . MCH 05/19/2018 31.3  26.0 - 34.0 pg Final  . MCHC 05/19/2018 31.7  30.0 - 36.0 g/dL Final  . RDW 05/19/2018 19.0* 11.5 - 15.5 % Final  . Platelets 05/19/2018 755* 150 - 400 K/uL Final  . nRBC 05/19/2018 0.4* 0.0 - 0.2 % Final  . Neutrophils Relative % 05/19/2018 73  % Final  . Neutro Abs 05/19/2018 7.1  1.7 - 7.7 K/uL Final  . Lymphocytes Relative 05/19/2018 12  % Final  . Lymphs Abs 05/19/2018 1.2  0.7 - 4.0 K/uL Final  . Monocytes Relative 05/19/2018 12  % Final  . Monocytes Absolute 05/19/2018 1.2* 0.1 - 1.0 K/uL Final  . Eosinophils Relative 05/19/2018 1  % Final  . Eosinophils Absolute 05/19/2018 0.1  0.0 -  0.5 K/uL Final  . Basophils Relative 05/19/2018 0  % Final  . Basophils Absolute 05/19/2018 0.0  0.0 - 0.1 K/uL Final  . Immature Granulocytes 05/19/2018 2  % Final  . Abs Immature Granulocytes 05/19/2018 0.18* 0.00 - 0.07 K/uL Final   Performed at [facility], [address], [place], [place] [address]    Assessment:  Mary Villarreal is a 85 y.o. female with thrombocytosis and anemia since 01/31/2017.  Platelet count has ranged between 671,000 - 896,000.  Hemoglobin has ranged from 10.5 - 11.8.  RBC indices have been macrocytic since 04/26/2018.  She is a vegetarian. She denies any melena, hematochezia, hematuria or vaginal bleeding.  She denies pica.  Bone marrow on 05/19/2018 revealed a cellular marrow with a JAK2 positive myeloproliferative neoplasm.  The fragmented nature of the core biopsy, in combination with the positive spicular aspirate smears, hindered the assessment of this myeloproliferative neoplasm. There were increased megakaryocytes with atypical features including clustering, hyperchromatic, bulbous and hypolobated nuclei.  However, given the increased fibrosis (MF-1) and morphologic appearance of the megakaryocytes, early primary myelofibrosis (PMF) is favored over essential thrombocythemia (ET).  Flow cytometry was negative.  Cytogenetics were normal (52, XX).  IPPS score 0 (low risk).  Labs on on 04/18/2018 and 04/26/2018 revealed a normal creatinine and LFTs.  Sed rate and CRP were normal.  Iron saturation was 22% with a TIBC of 493.8 (high).  B12 was 284 (low) on 03/25/2015.  TSH was 1.279 on 03/25/2015.  Work-up on 05/03/2018 revealed a hematocrit of 31.5, hemoglobin 10.1, MCV 100.3, platelets 583,000, white count 7200 with an ANC of 4900.  JAK2 V617F was positive.  BCR-ABL is pending.  Normal studies included:  ANA, TSH, folate, CRP (< 0.8), and sed rate (21).  B12 was 272.  Ferritin was 35 with an iron saturation of 27% with a TIBC of 436.  Reticulocyte count was  1.5%.   Patient has B12 deficiency.  She started on oral B12  She has had declining vision in the left eye x 6 weeks.  She has corneal edema per optometry.  She has pulsing discomfort in the left  temporal artery.  She was started on steroids on 05/10/2018.  Left temporal artery biopsy on 05/13/2018 revealed no evidence of giant cell arteritis.  Symptomatically, she denies any change in vision.  She is fatigued.    Plan: 1.   Review interval bone marrow aspirate and biopsy. 2.   Thrombocytosis and mild macrocytic anemia  Patient has a documented JAK2 + myeloproliferative disorder.  Bone marrow suggests either ET or PMF.     Per pathology, diagnosis appears more c/w PMF.   Clinical concern for ET.   Discuss consideration of pathology review by [facility].   Discuss consideration of empiric hydroxyurea or clinical trial    [facility] second opinion.   Copy of marrow report sent to patient.  Anticipate institution of hydroxyurea.  Platelet goal 400,000.   Information on medication sent to patient. 3.   Mild B12 deficiency  Patient on oral B12.  Anticipate follow-up level. 4.  Temporal artery pain and change in vision  Review interval temporal artery biopsy- no evidence of giant cell arteritis.  Discuss need for evaluation by ophthalmology.  Patient had an appointment at [facility].  Unclear if visual changes related to moderate thrombocytosis. 5.   RTC in 2 weeks for MD assessment (tele visit) and labs (CBC with diff, CMP, von Willebrand panel, B12).   I discussed the assessment and treatment plan with the patient.  The patient was provided an opportunity to ask questions and all were answered.  The patient agreed with the plan and demonstrated an understanding of the instructions.  The patient was advised to call back or seek an in person evaluation if the symptoms worsen or if the condition fails to improve as anticipated.  I provided 16 minutes (9:10 AM - 9:26 AM) of non-face-to-face time during this  encounter.  I provided these services from the [facility] office.   Lequita Asal, MD, PhD  05/30/2018, 9:10 AM

## 2018-05-30 ENCOUNTER — Inpatient Hospital Stay: Payer: Medicare Other | Attending: Hematology and Oncology | Admitting: Hematology and Oncology

## 2018-05-30 ENCOUNTER — Telehealth: Payer: Self-pay

## 2018-05-30 ENCOUNTER — Encounter: Payer: Self-pay | Admitting: Hematology and Oncology

## 2018-05-30 DIAGNOSIS — D508 Other iron deficiency anemias: Secondary | ICD-10-CM | POA: Insufficient documentation

## 2018-05-30 DIAGNOSIS — D539 Nutritional anemia, unspecified: Secondary | ICD-10-CM | POA: Diagnosis not present

## 2018-05-30 DIAGNOSIS — Z1589 Genetic susceptibility to other disease: Secondary | ICD-10-CM

## 2018-05-30 DIAGNOSIS — Z79899 Other long term (current) drug therapy: Secondary | ICD-10-CM | POA: Insufficient documentation

## 2018-05-30 DIAGNOSIS — E538 Deficiency of other specified B group vitamins: Secondary | ICD-10-CM

## 2018-05-30 DIAGNOSIS — Z87891 Personal history of nicotine dependence: Secondary | ICD-10-CM | POA: Insufficient documentation

## 2018-05-30 DIAGNOSIS — D473 Essential (hemorrhagic) thrombocythemia: Secondary | ICD-10-CM

## 2018-05-30 DIAGNOSIS — R7989 Other specified abnormal findings of blood chemistry: Secondary | ICD-10-CM | POA: Insufficient documentation

## 2018-05-30 DIAGNOSIS — Z8673 Personal history of transient ischemic attack (TIA), and cerebral infarction without residual deficits: Secondary | ICD-10-CM | POA: Insufficient documentation

## 2018-05-30 DIAGNOSIS — Z7982 Long term (current) use of aspirin: Secondary | ICD-10-CM | POA: Insufficient documentation

## 2018-05-30 DIAGNOSIS — D75839 Thrombocytosis, unspecified: Secondary | ICD-10-CM

## 2018-05-30 DIAGNOSIS — I10 Essential (primary) hypertension: Secondary | ICD-10-CM | POA: Insufficient documentation

## 2018-05-30 NOTE — Progress Notes (Signed)
Confirmed name, DOB, and address. Patient reports increase fatigue since having biopsy. Also states she has had increase in left leg pain (states this is due to not being able to go to chiropractor). Patient also would like to make Dr. Mike Gip aware of left "cyst" in pelvic area. States she has had this for a "couple of years".

## 2018-05-30 NOTE — Telephone Encounter (Signed)
Mailed Bone marrow results and hydroxyurea information to the patient 05/30/2018 at 9:41 AM.

## 2018-05-31 ENCOUNTER — Other Ambulatory Visit: Payer: Self-pay | Admitting: Hematology and Oncology

## 2018-05-31 DIAGNOSIS — Z1589 Genetic susceptibility to other disease: Secondary | ICD-10-CM

## 2018-05-31 DIAGNOSIS — D509 Iron deficiency anemia, unspecified: Secondary | ICD-10-CM

## 2018-05-31 DIAGNOSIS — D471 Chronic myeloproliferative disease: Secondary | ICD-10-CM

## 2018-06-01 ENCOUNTER — Telehealth: Payer: Self-pay | Admitting: *Deleted

## 2018-06-01 ENCOUNTER — Other Ambulatory Visit: Payer: Self-pay | Admitting: Hematology and Oncology

## 2018-06-01 NOTE — Telephone Encounter (Signed)
Patient called stating Dr Mike Gip told her if she did not hear from her by today to call her back, so she did. [phone]

## 2018-06-02 ENCOUNTER — Telehealth: Payer: Self-pay | Admitting: Hematology and Oncology

## 2018-06-02 ENCOUNTER — Other Ambulatory Visit: Payer: Self-pay | Admitting: Hematology and Oncology

## 2018-06-02 MED ORDER — HYDROXYUREA 500 MG PO CAPS: 500.0000 mg | ORAL_CAPSULE | ORAL | 0 refills | Status: DC

## 2018-06-02 MED ORDER — FERROUS SULFATE 325 (65 FE) MG PO TBEC: 325.0000 mg | DELAYED_RELEASE_TABLET | Freq: Every day | ORAL | 1 refills | Status: DC

## 2018-06-02 NOTE — Telephone Encounter (Signed)
Re:  Treatment plan  Discuss that bone marrow is being reviewed a [facility].  Discuss plan for initiation of hydroxyurea 500 mg 3 days a week. She will take it on Mondays , Wednesdays, and Fridays  OR Tuesdays, Thursdays, and Saturdays.  Dose will be adjusted in the future based on her blood counts. Platelet goal is < 400,000.  We discussed taking oral iron (ferrous sulfate) 1 tablet a day. She is already taking vitamin C. Side effects reviewed.  I encouraged her to contact our office if she has any concerns. She has a short term follow-up on 06/13/2018.   Lequita Asal, MD

## 2018-06-10 ENCOUNTER — Other Ambulatory Visit: Payer: Self-pay

## 2018-06-10 ENCOUNTER — Other Ambulatory Visit: Payer: Self-pay | Admitting: Hematology and Oncology

## 2018-06-10 ENCOUNTER — Inpatient Hospital Stay: Payer: Medicare Other | Attending: Hematology and Oncology

## 2018-06-10 ENCOUNTER — Inpatient Hospital Stay (HOSPITAL_BASED_OUTPATIENT_CLINIC_OR_DEPARTMENT_OTHER): Payer: Medicare Other | Admitting: Nurse Practitioner

## 2018-06-10 VITALS — BP 152/71 | HR 81 | Temp 98.3°F | Resp 24 | Wt 132.4 lb

## 2018-06-10 DIAGNOSIS — Z87891 Personal history of nicotine dependence: Secondary | ICD-10-CM | POA: Diagnosis not present

## 2018-06-10 DIAGNOSIS — R7989 Other specified abnormal findings of blood chemistry: Secondary | ICD-10-CM | POA: Diagnosis not present

## 2018-06-10 DIAGNOSIS — D538 Other specified nutritional anemias: Secondary | ICD-10-CM | POA: Insufficient documentation

## 2018-06-10 DIAGNOSIS — D473 Essential (hemorrhagic) thrombocythemia: Secondary | ICD-10-CM | POA: Insufficient documentation

## 2018-06-10 DIAGNOSIS — Z79899 Other long term (current) drug therapy: Secondary | ICD-10-CM | POA: Diagnosis not present

## 2018-06-10 DIAGNOSIS — E538 Deficiency of other specified B group vitamins: Secondary | ICD-10-CM | POA: Insufficient documentation

## 2018-06-10 DIAGNOSIS — Z1589 Genetic susceptibility to other disease: Secondary | ICD-10-CM

## 2018-06-10 DIAGNOSIS — Z7982 Long term (current) use of aspirin: Secondary | ICD-10-CM | POA: Diagnosis not present

## 2018-06-10 DIAGNOSIS — D509 Iron deficiency anemia, unspecified: Secondary | ICD-10-CM

## 2018-06-10 DIAGNOSIS — D649 Anemia, unspecified: Secondary | ICD-10-CM | POA: Insufficient documentation

## 2018-06-10 DIAGNOSIS — Z8673 Personal history of transient ischemic attack (TIA), and cerebral infarction without residual deficits: Secondary | ICD-10-CM | POA: Diagnosis not present

## 2018-06-10 DIAGNOSIS — I10 Essential (primary) hypertension: Secondary | ICD-10-CM | POA: Diagnosis not present

## 2018-06-10 DIAGNOSIS — R5383 Other fatigue: Secondary | ICD-10-CM

## 2018-06-10 DIAGNOSIS — D508 Other iron deficiency anemias: Secondary | ICD-10-CM | POA: Diagnosis not present

## 2018-06-10 DIAGNOSIS — D75839 Thrombocytosis, unspecified: Secondary | ICD-10-CM

## 2018-06-10 LAB — CBC WITH DIFFERENTIAL/PLATELET
Abs Immature Granulocytes: 0.14 10*3/uL — ABNORMAL HIGH (ref 0.00–0.07)
Basophils Absolute: 0.1 10*3/uL (ref 0.0–0.1)
Basophils Relative: 1 %
Eosinophils Absolute: 0.2 10*3/uL (ref 0.0–0.5)
Eosinophils Relative: 2 %
HCT: 28.8 % — ABNORMAL LOW (ref 36.0–46.0)
Hemoglobin: 9.2 g/dL — ABNORMAL LOW (ref 12.0–15.0)
Immature Granulocytes: 1 %
Lymphocytes Relative: 11 %
Lymphs Abs: 1.1 10*3/uL (ref 0.7–4.0)
MCH: 31.3 pg (ref 26.0–34.0)
MCHC: 31.9 g/dL (ref 30.0–36.0)
MCV: 98 fL (ref 80.0–100.0)
Monocytes Absolute: 1.6 10*3/uL — ABNORMAL HIGH (ref 0.1–1.0)
Monocytes Relative: 16 %
Neutro Abs: 7.1 10*3/uL (ref 1.7–7.7)
Neutrophils Relative %: 69 %
Platelets: 778 10*3/uL — ABNORMAL HIGH (ref 150–400)
RBC: 2.94 MIL/uL — ABNORMAL LOW (ref 3.87–5.11)
RDW: 19.5 % — ABNORMAL HIGH (ref 11.5–15.5)
WBC: 10.3 10*3/uL (ref 4.0–10.5)
nRBC: 0.7 % — ABNORMAL HIGH (ref 0.0–0.2)

## 2018-06-10 LAB — COMPREHENSIVE METABOLIC PANEL
ALT: 17 U/L (ref 0–44)
AST: 33 U/L (ref 15–41)
Albumin: 3.6 g/dL (ref 3.5–5.0)
Alkaline Phosphatase: 84 U/L (ref 38–126)
Anion gap: 8 (ref 5–15)
BUN: 18 mg/dL (ref 8–23)
CO2: 23 mmol/L (ref 22–32)
Calcium: 9.1 mg/dL (ref 8.9–10.3)
Chloride: 103 mmol/L (ref 98–111)
Creatinine, Ser: 0.83 mg/dL (ref 0.44–1.00)
GFR calc Af Amer: >60 mL/min (ref >60)
GFR calc non Af Amer: >60 mL/min (ref >60)
Glucose, Bld: 101 mg/dL — ABNORMAL HIGH (ref 70–99)
Potassium: 3.9 mmol/L (ref 3.5–5.1)
Sodium: 134 mmol/L — ABNORMAL LOW (ref 135–145)
Total Bilirubin: 1 mg/dL (ref 0.3–1.2)
Total Protein: 7.1 g/dL (ref 6.5–8.1)

## 2018-06-10 LAB — FERRITIN: Ferritin: 123 ng/mL (ref 11–307)

## 2018-06-10 NOTE — Progress Notes (Signed)
Symptom Management [place]  Telephone:([phone] [phone]  Patient Care Team: Sallee Lange, NP as PCP - General (Internal Medicine)   Name of the patient: Mary Villarreal  [id number]  10-Aug-1928   Date of visit: 06/10/18   Diagnosis-thrombocytosis and Jak 2+  Chief complaint/ Reason for visit- Fatigue & weakness  Heme/Onc history:   No history exists.   05-10-2018-ongoing visual changes left worse than right.  Temporal artery biopsy on 05/13/2018 revealed no evidence of giant cell arteritis.  She was referred to [facility] but appointment delayed due to COVID-19.  05/19/2018-cellular marrow with Jak 2+ myeloproliferative neoplasm.  Fragmented nature of core biopsy and positive spicular aspirate smears hindered assessment of myeloproliferative neoplasm.  Increased megakaryocytes with atypical features including clustering, hyperchromatic, bulbous, hypolobated nuclei.  However given the increased fibrosis MF 1 and morphologic appearance of megakaryocytes, early primary myelofibrosis (PMF) is favored over essential thrombocythemia.  Flow cytometry was negative.  Cytogenetics were normal.  05-30-2018-   Interval history-Mary Villarreal, 85 year old female with history of thrombocytosis and positive Jak 2, currently on hydroxyurea, presents to symptom management clinic for complaints of fatigue and vision changes.  These have been chronic and ongoing problems and she has been seen by Dr. Mike Gip for the same.  She does not feel that symptoms are worse.  She was coming to clinic today for labs per Dr. Mike Gip and asked to be seen.  She questions if hydroxyurea may be contributing to symptoms. She is taking 500 mg three times a week.   ECOG FS:3 - Symptomatic, >50% confined to bed  Review of systems- Review of Systems  Constitutional: Positive for malaise/fatigue. Negative for chills, fever and weight loss.  HENT: Negative for  congestion and sore throat.   Eyes: Negative for blurred vision, double vision, photophobia, pain, discharge and redness.       Poor vision  Respiratory: Negative for cough and sputum production.   Cardiovascular: Negative for chest pain and palpitations.  Gastrointestinal: Negative.  Negative for abdominal pain, constipation, diarrhea and nausea.  Genitourinary: Negative.   Musculoskeletal: Negative for falls and myalgias.  Neurological: Positive for weakness. Negative for dizziness and loss of consciousness.  Endo/Heme/Allergies: Negative for environmental allergies. Bruises/bleeds easily.  Psychiatric/Behavioral: Negative for depression. The patient is not nervous/anxious and does not have insomnia.      Current treatment- hydroxyurea  No Known Allergies  Past Medical History:  Diagnosis Date   Aortic aneurysm (HCC)    Aortic aneurysm, abdominal (HCC)    Balance problems    Bladder prolapse, female, acquired    Coronary artery disease    GERD (gastroesophageal reflux disease)    Hyperlipidemia    Hypertension    TIA (transient ischemic attack)    Vision changes     Past Surgical History:  Procedure Laterality Date   ARTERY BIOPSY Left 05/13/2018   Procedure: BIOPSY TEMPORAL ARTERY LEFT;  Surgeon: Benjamine Sprague, DO;  Location: [facility] ORS;  Service: General;  Laterality: Left;   DG GALL BLADDER     EYE SURGERY     cataracts    Social History   Socioeconomic History   Marital status: Widowed    Spouse name: Not on file   Number of children: Not on file   Years of education: Not on file   Highest education level: Not on file  Occupational History   Not on file  Social Needs   Financial resource strain: Not on file  Food insecurity:    Worry: Not on file    Inability: Not on file   Transportation needs:    Medical: Not on file    Non-medical: Not on file  Tobacco Use   Smoking status: Former Smoker    Packs/day: 2.00    Years: 40.00     Pack years: 80.00    Last attempt to quit: 05/11/1988    Years since quitting: 30.1   Smokeless tobacco: Never Used   Tobacco comment: stop smoking 40 yrs ago  Substance and Sexual Activity   Alcohol use: Yes    Alcohol/week: 2.0 standard drinks    Types: 2 Glasses of wine per week    Comment: 14   Drug use: No   Sexual activity: Not on file  Lifestyle   Physical activity:    Days per week: Not on file    Minutes per session: Not on file   Stress: Not on file  Relationships   Social connections:    Talks on phone: Not on file    Gets together: Not on file    Attends religious service: Not on file    Active member of club or organization: Not on file    Attends meetings of clubs or organizations: Not on file    Relationship status: Not on file   Intimate partner violence:    Fear of current or ex partner: Not on file    Emotionally abused: Not on file    Physically abused: Not on file    Forced sexual activity: Not on file  Other Topics Concern   Not on file  Social History Narrative   Not on file    No family history on file.   Current Outpatient Medications:    Ascorbic Acid (VITAMIN C) 100 MG tablet, Take 100 mg by mouth daily., Disp: , Rfl:    aspirin 81 MG chewable tablet, Chew 81 mg by mouth daily. , Disp: , Rfl:    CALCIUM PO, Take 1 tablet by mouth 2 (two) times daily. , Disp: , Rfl:    diltiazem (CARDIZEM CD) 240 MG 24 hr capsule, Take 240 mg by mouth daily., Disp: , Rfl:    ferrous sulfate 325 (65 FE) MG EC tablet, Take 1 tablet (325 mg total) by mouth daily with breakfast., Disp: 30 tablet, Rfl: 1   glucosamine-chondroitin 500-400 MG tablet, Take 2 tablets by mouth daily., Disp: , Rfl:    hydroxyurea (HYDREA) 500 MG capsule, Take 1 capsule (500 mg total) by mouth 3 (three) times a week. May take with food to minimize GI side effects., Disp: 30 capsule, Rfl: 0   losartan (COZAAR) 50 MG tablet, Take 2 tablets by mouth 2 (two) times daily. ,  Disp: , Rfl: 1   Multiple Vitamins-Minerals (MULTIVITAMIN ADULT PO), Take 1 tablet by mouth daily. , Disp: , Rfl:    naproxen sodium (ALEVE) 220 MG tablet, Take 220 mg by mouth daily as needed., Disp: , Rfl:    simvastatin (ZOCOR) 20 MG tablet, Take 20 mg by mouth daily at 6 PM. , Disp: , Rfl:    vitamin B-12 (CYANOCOBALAMIN) 1000 MCG tablet, Take 1,000 mcg by mouth daily., Disp: , Rfl:    Vitamin D, Ergocalciferol, 2000 units CAPS, Take 2,000 Units by mouth daily. , Disp: , Rfl:   Physical exam:  Vitals:   06/10/18 1114 06/10/18 1118  BP: (!) 152/71   Pulse: 81   Resp: (!) 24   Temp: 98.3 F (  36.8 C)   TempSrc: Oral   SpO2:  95%  Weight: 132 lb 6.4 oz (60.1 kg)    Physical Exam Constitutional:      General: She is not in acute distress.    Comments: Frail, elderly female  HENT:     Nose: No congestion or rhinorrhea.     Mouth/Throat:     Mouth: Mucous membranes are moist.     Pharynx: Oropharynx is clear.  Eyes:     General: No scleral icterus.    Conjunctiva/sclera: Conjunctivae normal.  Neck:     Musculoskeletal: Neck supple.  Cardiovascular:     Rate and Rhythm: Normal rate and regular rhythm.     Pulses: Normal pulses.  Pulmonary:     Effort: Pulmonary effort is normal.     Breath sounds: Normal breath sounds.  Abdominal:     General: Bowel sounds are normal.     Palpations: Abdomen is soft.     Tenderness: There is no abdominal tenderness.  Musculoskeletal:        General: No deformity or signs of injury.  Skin:    General: Skin is warm and dry.  Neurological:     Mental Status: She is alert and oriented to person, place, and time.     Motor: No weakness.  Psychiatric:        Mood and Affect: Mood normal.        Behavior: Behavior normal.      CMP Latest Ref Rng & Units 06/10/2018  Glucose 70 - 99 mg/dL 101(H)  BUN 8 - 23 mg/dL 18  Creatinine 0.44 - 1.00 mg/dL 0.83  Sodium 135 - 145 mmol/L 134(L)  Potassium 3.5 - 5.1 mmol/L 3.9  Chloride 98 -  111 mmol/L 103  CO2 22 - 32 mmol/L 23  Calcium 8.9 - 10.3 mg/dL 9.1  Total Protein 6.5 - 8.1 g/dL 7.1  Total Bilirubin 0.3 - 1.2 mg/dL 1.0  Alkaline Phos 38 - 126 U/L 84  AST 15 - 41 U/L 33  ALT 0 - 44 U/L 17   CBC Latest Ref Rng & Units 06/10/2018  WBC 4.0 - 10.5 K/uL 10.3  Hemoglobin 12.0 - 15.0 g/dL 9.2(L)  Hematocrit 36.0 - 46.0 % 28.8(L)  Platelets 150 - 400 K/uL 778(H)    No images are attached to the encounter.  Ct Bone Marrow Biopsy & Aspiration  Result Date: 05/19/2018 INDICATION: Thrombocytosis and anemia. EXAM: CT BONE MARROW BIOPSY AND ASPIRATION MEDICATIONS: None. ANESTHESIA/SEDATION: Fentanyl 75 mcg IV; Versed 3 mg IV Moderate Sedation Time:  21 minutes The patient was continuously monitored during the procedure by the interventional radiology nurse under my direct supervision. FLUOROSCOPY TIME:  None COMPLICATIONS: None immediate. PROCEDURE: Informed written consent was obtained from the patient after a thorough discussion of the procedural risks, benefits and alternatives. All questions were addressed. Maximal Sterile Barrier Technique was utilized including caps, mask, sterile gowns, sterile gloves, sterile drape, hand hygiene and skin antiseptic. A timeout was performed prior to the initiation of the procedure. Under CT guidance, a(n) 11 gauge guide needle was advanced into the right iliac bone via posterior approach. Aspirates and a 2 cores were obtained. Post biopsy images demonstrate no hemorrhage. Patient tolerated the procedure well without complication. Vital sign monitoring by nursing staff during the procedure will continue as patient is in the special procedures unit for post procedure observation. FINDINGS: The images document guide needle placement within the right iliac bone. Post biopsy images demonstrate no hemorrhage. IMPRESSION:  Successful CT-guided right iliac bone marrow aspirate and core. Electronically Signed   By: Marybelle Killings M.D.   On: 05/19/2018 10:20     Assessment and plan- Patient is a 85 y.o. female diagnosed with thrombocytosis and +JAK2 testing who presents to clinic today for fatigue.   1. Fatigue- likely multi-factoral and has been seen by Dr. Mike Gip for same. Symptoms stable. Labs today.  Anemia slightly worse at 9.2 (previously around 10), platelets 778- stable, no leukocytosis and no evidence of acute infection.  Metabolic panel stable. Ferritin pending. Start venofer x 2 next week. Will plan for venofer at next visit. Hydroxyurea may be contributing though. We discussed risks vs benefits of continuing medication & she would like to continue at this time. Follow up with Dr. Mike Gip as scheduled for discussion of pathology (report not yet available) or if symptoms worsen or do not improve.   2. Vision changes- unclear etiology; asymptomatic in clinic today. I asked her to make an appointment with  Dr. Lyndel Safe to be evaluated asap or ER if vision changes occur again.    Visit Diagnosis 1. Other fatigue     Patient expressed understanding and was in agreement with this plan. She also understands that She can call clinic at any time with any questions, concerns, or complaints.   Thank you for allowing me to participate in the care of this very pleasant patient.   Beckey Rutter, DNP, AGNP-C [facility] (work cell) [phone] (office)  CC: Dr. Mike Gip

## 2018-06-10 NOTE — Patient Instructions (Addendum)
Iron Sucrose injection What is this medicine? IRON SUCROSE (AHY ern SOO krohs) is an iron complex. Iron is used to make healthy red blood cells, which carry oxygen and nutrients throughout the body. This medicine is used to treat iron deficiency anemia in people with chronic kidney disease. This medicine may be used for other purposes; ask your health care provider or pharmacist if you have questions. COMMON BRAND NAME(S): Venofer What should I tell my health care provider before I take this medicine? They need to know if you have any of these conditions: -anemia not caused by low iron levels -heart disease -high levels of iron in the blood -kidney disease -liver disease -an unusual or allergic reaction to iron, other medicines, foods, dyes, or preservatives -pregnant or trying to get pregnant -breast-feeding How should I use this medicine? This medicine is for infusion into a vein. It is given by a health care professional in a hospital or clinic setting. Talk to your pediatrician regarding the use of this medicine in children. While this drug may be prescribed for children as young as 2 years for selected conditions, precautions do apply. Overdosage: If you think you have taken too much of this medicine contact a poison control center or emergency room at once. NOTE: This medicine is only for you. Do not share this medicine with others. What if I miss a dose? It is important not to miss your dose. Call your doctor or health care professional if you are unable to keep an appointment. What may interact with this medicine? Do not take this medicine with any of the following medications: -deferoxamine -dimercaprol -other iron products This medicine may also interact with the following medications: -chloramphenicol -deferasirox This list may not describe all possible interactions. Give your health care provider a list of all the medicines, herbs, non-prescription drugs, or dietary  supplements you use. Also tell them if you smoke, drink alcohol, or use illegal drugs. Some items may interact with your medicine. What should I watch for while using this medicine? Visit your doctor or healthcare professional regularly. Tell your doctor or healthcare professional if your symptoms do not start to get better or if they get worse. You may need blood work done while you are taking this medicine. You may need to follow a special diet. Talk to your doctor. Foods that contain iron include: whole grains/cereals, dried fruits, beans, or peas, leafy green vegetables, and organ meats (liver, kidney). What side effects may I notice from receiving this medicine? Side effects that you should report to your doctor or health care professional as soon as possible: -allergic reactions like skin rash, itching or hives, swelling of the face, lips, or tongue -breathing problems -changes in blood pressure -cough -fast, irregular heartbeat -feeling faint or lightheaded, falls -fever or chills -flushing, sweating, or hot feelings -joint or muscle aches/pains -seizures -swelling of the ankles or feet -unusually weak or tired Side effects that usually do not require medical attention (report to your doctor or health care professional if they continue or are bothersome): -diarrhea -feeling achy -headache -irritation at site where injected -nausea, vomiting -stomach upset -tiredness This list may not describe all possible side effects. Call your doctor for medical advice about side effects. You may report side effects to FDA at 1-800-FDA-1088. Where should I keep my medicine? This drug is given in a hospital or clinic and will not be stored at home. NOTE: This sheet is a summary. It may not cover all possible information. If   you have questions about this medicine, talk to your doctor, pharmacist, or health care provider.  2019 Elsevier/Gold Standard (2010-11-20 17:14:35)  

## 2018-06-10 NOTE — Progress Notes (Signed)
Patient just started hydroxyurea recently.  She said that she checked her blood pressure earlier in the week and the systolic was 443.  She has chosen to not take her Losartan today.  She explains that she has severe fatigue and cannot think straight.  She does not report any blood in urine or stool at this time.

## 2018-06-11 LAB — VON WILLEBRAND PANEL
Coagulation Factor VIII: 293 % — ABNORMAL HIGH (ref 56–140)
Ristocetin Co-factor, Plasma: 189 % (ref 50–200)
Von Willebrand Antigen, Plasma: 288 % — ABNORMAL HIGH (ref 50–200)

## 2018-06-11 LAB — COAG STUDIES INTERP REPORT

## 2018-06-13 ENCOUNTER — Inpatient Hospital Stay: Payer: Medicare Other | Admitting: Hematology and Oncology

## 2018-06-15 ENCOUNTER — Inpatient Hospital Stay: Payer: Medicare Other

## 2018-06-15 ENCOUNTER — Other Ambulatory Visit: Payer: Self-pay

## 2018-06-15 VITALS — BP 111/70 | HR 76 | Temp 96.0°F | Resp 20

## 2018-06-15 DIAGNOSIS — E538 Deficiency of other specified B group vitamins: Secondary | ICD-10-CM | POA: Diagnosis not present

## 2018-06-15 DIAGNOSIS — D508 Other iron deficiency anemias: Secondary | ICD-10-CM | POA: Diagnosis present

## 2018-06-15 DIAGNOSIS — Z79899 Other long term (current) drug therapy: Secondary | ICD-10-CM | POA: Diagnosis not present

## 2018-06-15 DIAGNOSIS — Z7982 Long term (current) use of aspirin: Secondary | ICD-10-CM | POA: Diagnosis not present

## 2018-06-15 DIAGNOSIS — R7989 Other specified abnormal findings of blood chemistry: Secondary | ICD-10-CM | POA: Diagnosis not present

## 2018-06-15 DIAGNOSIS — I10 Essential (primary) hypertension: Secondary | ICD-10-CM | POA: Diagnosis not present

## 2018-06-15 DIAGNOSIS — D509 Iron deficiency anemia, unspecified: Secondary | ICD-10-CM

## 2018-06-15 DIAGNOSIS — Z87891 Personal history of nicotine dependence: Secondary | ICD-10-CM | POA: Diagnosis not present

## 2018-06-15 DIAGNOSIS — Z8673 Personal history of transient ischemic attack (TIA), and cerebral infarction without residual deficits: Secondary | ICD-10-CM | POA: Diagnosis not present

## 2018-06-15 MED ORDER — SODIUM CHLORIDE 0.9 % IV SOLN
Freq: Once | INTRAVENOUS | Status: AC
  Administered 2018-06-15: 14:00:00 via INTRAVENOUS
  Filled 2018-06-15: qty 250

## 2018-06-15 MED ORDER — IRON SUCROSE 20 MG/ML IV SOLN
200.0000 mg | Freq: Once | INTRAVENOUS | Status: AC
  Administered 2018-06-15: 200 mg via INTRAVENOUS

## 2018-06-15 MED ORDER — SODIUM CHLORIDE 0.9 % IV SOLN: 200.0000 mg | Freq: Once | INTRAVENOUS | Status: DC

## 2018-06-15 NOTE — Patient Instructions (Signed)
Iron Sucrose injection What is this medicine? IRON SUCROSE (AHY ern SOO krohs) is an iron complex. Iron is used to make healthy red blood cells, which carry oxygen and nutrients throughout the body. This medicine is used to treat iron deficiency anemia in people with chronic kidney disease. This medicine may be used for other purposes; ask your health care provider or pharmacist if you have questions. COMMON BRAND NAME(S): Venofer What should I tell my health care provider before I take this medicine? They need to know if you have any of these conditions: -anemia not caused by low iron levels -heart disease -high levels of iron in the blood -kidney disease -liver disease -an unusual or allergic reaction to iron, other medicines, foods, dyes, or preservatives -pregnant or trying to get pregnant -breast-feeding How should I use this medicine? This medicine is for infusion into a vein. It is given by a health care professional in a hospital or clinic setting. Talk to your pediatrician regarding the use of this medicine in children. While this drug may be prescribed for children as young as 2 years for selected conditions, precautions do apply. Overdosage: If you think you have taken too much of this medicine contact a poison control center or emergency room at once. NOTE: This medicine is only for you. Do not share this medicine with others. What if I miss a dose? It is important not to miss your dose. Call your doctor or health care professional if you are unable to keep an appointment. What may interact with this medicine? Do not take this medicine with any of the following medications: -deferoxamine -dimercaprol -other iron products This medicine may also interact with the following medications: -chloramphenicol -deferasirox This list may not describe all possible interactions. Give your health care provider a list of all the medicines, herbs, non-prescription drugs, or dietary  supplements you use. Also tell them if you smoke, drink alcohol, or use illegal drugs. Some items may interact with your medicine. What should I watch for while using this medicine? Visit your doctor or healthcare professional regularly. Tell your doctor or healthcare professional if your symptoms do not start to get better or if they get worse. You may need blood work done while you are taking this medicine. You may need to follow a special diet. Talk to your doctor. Foods that contain iron include: whole grains/cereals, dried fruits, beans, or peas, leafy green vegetables, and organ meats (liver, kidney). What side effects may I notice from receiving this medicine? Side effects that you should report to your doctor or health care professional as soon as possible: -allergic reactions like skin rash, itching or hives, swelling of the face, lips, or tongue -breathing problems -changes in blood pressure -cough -fast, irregular heartbeat -feeling faint or lightheaded, falls -fever or chills -flushing, sweating, or hot feelings -joint or muscle aches/pains -seizures -swelling of the ankles or feet -unusually weak or tired Side effects that usually do not require medical attention (report to your doctor or health care professional if they continue or are bothersome): -diarrhea -feeling achy -headache -irritation at site where injected -nausea, vomiting -stomach upset -tiredness This list may not describe all possible side effects. Call your doctor for medical advice about side effects. You may report side effects to FDA at 1-800-FDA-1088. Where should I keep my medicine? This drug is given in a hospital or clinic and will not be stored at home. NOTE: This sheet is a summary. It may not cover all possible information. If   you have questions about this medicine, talk to your doctor, pharmacist, or health care provider.  2019 Elsevier/Gold Standard (2010-11-20 17:14:35)  

## 2018-06-15 NOTE — Progress Notes (Signed)
Patient reports that she feels poorly and sleeps all day.  She says that her last bowel movement was black diarrhea.  VSS today.  Patient currently getting first Venofer infusion.  She will return next week for second dose.

## 2018-06-22 ENCOUNTER — Ambulatory Visit: Payer: Medicare Other

## 2018-06-24 ENCOUNTER — Encounter: Payer: Self-pay | Admitting: Hematology and Oncology

## 2018-06-30 ENCOUNTER — Other Ambulatory Visit: Payer: Self-pay

## 2018-06-30 ENCOUNTER — Inpatient Hospital Stay: Payer: Medicare Other | Attending: Hematology and Oncology

## 2018-06-30 DIAGNOSIS — D509 Iron deficiency anemia, unspecified: Secondary | ICD-10-CM

## 2018-06-30 DIAGNOSIS — D539 Nutritional anemia, unspecified: Secondary | ICD-10-CM | POA: Insufficient documentation

## 2018-06-30 DIAGNOSIS — D473 Essential (hemorrhagic) thrombocythemia: Secondary | ICD-10-CM | POA: Insufficient documentation

## 2018-06-30 DIAGNOSIS — R5383 Other fatigue: Secondary | ICD-10-CM | POA: Diagnosis not present

## 2018-06-30 DIAGNOSIS — H539 Unspecified visual disturbance: Secondary | ICD-10-CM | POA: Insufficient documentation

## 2018-06-30 DIAGNOSIS — Z87891 Personal history of nicotine dependence: Secondary | ICD-10-CM | POA: Diagnosis not present

## 2018-06-30 DIAGNOSIS — Z7289 Other problems related to lifestyle: Secondary | ICD-10-CM | POA: Insufficient documentation

## 2018-06-30 DIAGNOSIS — Z8673 Personal history of transient ischemic attack (TIA), and cerebral infarction without residual deficits: Secondary | ICD-10-CM | POA: Diagnosis not present

## 2018-06-30 DIAGNOSIS — R52 Pain, unspecified: Secondary | ICD-10-CM | POA: Insufficient documentation

## 2018-06-30 DIAGNOSIS — I2510 Atherosclerotic heart disease of native coronary artery without angina pectoris: Secondary | ICD-10-CM | POA: Insufficient documentation

## 2018-06-30 DIAGNOSIS — E538 Deficiency of other specified B group vitamins: Secondary | ICD-10-CM | POA: Insufficient documentation

## 2018-06-30 LAB — IRON AND TIBC
Iron: 94 ug/dL (ref 28–170)
Saturation Ratios: 26 % (ref 10.4–31.8)
TIBC: 364 ug/dL (ref 250–450)
UIBC: 270 ug/dL

## 2018-06-30 LAB — CBC WITH DIFFERENTIAL/PLATELET
Abs Immature Granulocytes: 0.03 10*3/uL (ref 0.00–0.07)
Basophils Absolute: 0.1 10*3/uL (ref 0.0–0.1)
Basophils Relative: 1 %
Eosinophils Absolute: 0.2 10*3/uL (ref 0.0–0.5)
Eosinophils Relative: 4 %
HCT: 30.4 % — ABNORMAL LOW (ref 36.0–46.0)
Hemoglobin: 9.6 g/dL — ABNORMAL LOW (ref 12.0–15.0)
Immature Granulocytes: 1 %
Lymphocytes Relative: 17 %
Lymphs Abs: 1.2 10*3/uL (ref 0.7–4.0)
MCH: 32.3 pg (ref 26.0–34.0)
MCHC: 31.6 g/dL (ref 30.0–36.0)
MCV: 102.4 fL — ABNORMAL HIGH (ref 80.0–100.0)
Monocytes Absolute: 0.9 10*3/uL (ref 0.1–1.0)
Monocytes Relative: 13 %
Neutro Abs: 4.3 10*3/uL (ref 1.7–7.7)
Neutrophils Relative %: 64 %
Platelets: 670 10*3/uL — ABNORMAL HIGH (ref 150–400)
RBC: 2.97 MIL/uL — ABNORMAL LOW (ref 3.87–5.11)
RDW: 21.4 % — ABNORMAL HIGH (ref 11.5–15.5)
WBC: 6.7 10*3/uL (ref 4.0–10.5)
nRBC: 0 % (ref 0.0–0.2)

## 2018-06-30 LAB — COMPREHENSIVE METABOLIC PANEL
ALT: 15 U/L (ref 0–44)
AST: 28 U/L (ref 15–41)
Albumin: 3.7 g/dL (ref 3.5–5.0)
Alkaline Phosphatase: 65 U/L (ref 38–126)
Anion gap: 8 (ref 5–15)
BUN: 20 mg/dL (ref 8–23)
CO2: 27 mmol/L (ref 22–32)
Calcium: 8.8 mg/dL — ABNORMAL LOW (ref 8.9–10.3)
Chloride: 101 mmol/L (ref 98–111)
Creatinine, Ser: 0.99 mg/dL (ref 0.44–1.00)
GFR calc Af Amer: 59 mL/min — ABNORMAL LOW (ref >60)
GFR calc non Af Amer: 51 mL/min — ABNORMAL LOW (ref >60)
Glucose, Bld: 143 mg/dL — ABNORMAL HIGH (ref 70–99)
Potassium: 4 mmol/L (ref 3.5–5.1)
Sodium: 136 mmol/L (ref 135–145)
Total Bilirubin: 0.6 mg/dL (ref 0.3–1.2)
Total Protein: 6.6 g/dL (ref 6.5–8.1)

## 2018-06-30 LAB — VITAMIN B12: Vitamin B-12: 988 pg/mL — ABNORMAL HIGH (ref 180–914)

## 2018-06-30 LAB — FERRITIN: Ferritin: 122 ng/mL (ref 11–307)

## 2018-07-01 ENCOUNTER — Telehealth: Payer: Self-pay | Admitting: *Deleted

## 2018-07-01 NOTE — Telephone Encounter (Signed)
Patient left message on triage line requesting call back with lab results from yesterday and follow up for iron infusion if needed.

## 2018-07-07 ENCOUNTER — Encounter: Payer: Self-pay | Admitting: Hematology and Oncology

## 2018-07-09 ENCOUNTER — Encounter: Payer: Self-pay | Admitting: Hematology and Oncology

## 2018-07-14 ENCOUNTER — Other Ambulatory Visit: Payer: Self-pay | Admitting: Hematology and Oncology

## 2018-07-14 ENCOUNTER — Other Ambulatory Visit: Payer: Self-pay

## 2018-07-14 ENCOUNTER — Encounter: Payer: Self-pay | Admitting: Hematology and Oncology

## 2018-07-14 ENCOUNTER — Telehealth: Payer: Self-pay | Admitting: Hematology and Oncology

## 2018-07-14 ENCOUNTER — Ambulatory Visit
Admission: RE | Admit: 2018-07-14 | Discharge: 2018-07-14 | Disposition: A | Discharge status: Home / Self Care | Payer: Medicare Other | Source: Ambulatory Visit | Attending: Hematology and Oncology | Admitting: Hematology and Oncology

## 2018-07-14 DIAGNOSIS — M79605 Pain in left leg: Secondary | ICD-10-CM | POA: Diagnosis present

## 2018-07-14 NOTE — Telephone Encounter (Signed)
Re:  Unusual left leg pain  Discussed with patient symptoms of discomfort in left leg that appears to be moving.  She denies any shortness of breath or chest pain.    Discuss plan for left lower extremity duplex.  Patient in agreement.   Lequita Asal, MD

## 2018-07-21 ENCOUNTER — Encounter: Payer: Self-pay | Admitting: Hematology and Oncology

## 2018-07-25 ENCOUNTER — Telehealth: Payer: Self-pay

## 2018-07-25 NOTE — Telephone Encounter (Signed)
spoke with Mary Villarreal to inform her that her information has been faxed over to [facility] for a new patient referral and they should be reaching out to her soon to get her schedule. The patient was understanding and agreeable to be on a look out for the office to contact her.

## 2018-07-29 ENCOUNTER — Encounter: Payer: Self-pay | Admitting: Hematology and Oncology

## 2018-08-01 ENCOUNTER — Telehealth: Payer: Self-pay

## 2018-08-01 ENCOUNTER — Other Ambulatory Visit: Payer: Self-pay | Admitting: Hematology and Oncology

## 2018-08-01 DIAGNOSIS — D471 Chronic myeloproliferative disease: Secondary | ICD-10-CM

## 2018-08-01 DIAGNOSIS — Z1589 Genetic susceptibility to other disease: Secondary | ICD-10-CM

## 2018-08-01 NOTE — Telephone Encounter (Signed)
Spoke with the patient to inform her that i have spoken with [facility] and they did received the referral and she is waiting on a reply from MD to see when to get the patient schedule/ due to MD only working on Mondays and will not be back in full clinic until 09/2018 it has been a challenge. Due to high volume of pandemic. The referral was sent to MD on 07/25/2018. The patient understanding and agreed to keep schedule appointments.

## 2018-08-01 NOTE — Telephone Encounter (Signed)
Refill Hydrea 500 mg 1 tab po TID # 30 with no refill. The patient has been informed of the refill.

## 2018-08-08 DIAGNOSIS — D471 Chronic myeloproliferative disease: Principal | ICD-10-CM

## 2018-08-09 ENCOUNTER — Encounter: Admit: 2018-08-09 | Discharge: 2018-08-09 | Payer: MEDICARE | Attending: Internal Medicine | Primary: Internal Medicine

## 2018-08-09 DIAGNOSIS — D696 Thrombocytopenia, unspecified: Principal | ICD-10-CM

## 2018-08-11 ENCOUNTER — Other Ambulatory Visit: Payer: Self-pay | Admitting: Hematology and Oncology

## 2018-08-11 DIAGNOSIS — D509 Iron deficiency anemia, unspecified: Secondary | ICD-10-CM

## 2018-09-26 ENCOUNTER — Other Ambulatory Visit: Payer: Self-pay | Admitting: Hematology and Oncology

## 2018-09-26 DIAGNOSIS — Z1589 Genetic susceptibility to other disease: Secondary | ICD-10-CM

## 2018-09-26 DIAGNOSIS — D471 Chronic myeloproliferative disease: Secondary | ICD-10-CM

## 2018-09-26 NOTE — Telephone Encounter (Signed)
CBC with Differential/Platelet Order: [id number] Status:  Final result Visible to patient:  Yes (MyChart) Next appt:  None Dx:  Iron deficiency anemia, unspecified i...  Ref Range & Units 39mo ago 14mo ago 73mo ago  WBC 4.0 - 10.5 K/uL 6.7  10.3  9.8   RBC 3.87 - 5.11 MIL/uL 2.97Low   2.94Low   3.35Low    Hemoglobin 12.0 - 15.0 g/dL 9.6Low   9.2Low   10.5Low    HCT 36.0 - 46.0 % 30.4Low   28.8Low   33.1Low    MCV 80.0 - 100.0 fL 102.4High   98.0  98.8   MCH 26.0 - 34.0 pg 32.3  31.3  31.3   MCHC 30.0 - 36.0 g/dL 31.6  31.9  31.7   RDW 11.5 - 15.5 % 21.4High   19.5High   19.0High    Platelets 150 - 400 K/uL 670High   778High   755High    nRBC 0.0 - 0.2 % 0.0  0.7High   0.4High    Neutrophils Relative % % 64  69  73   Neutro Abs 1.7 - 7.7 K/uL 4.3  7.1  7.1   Lymphocytes Relative % 17  11  12    Lymphs Abs 0.7 - 4.0 K/uL 1.2  1.1  1.2   Monocytes Relative % 13  16  12    Monocytes Absolute 0.1 - 1.0 K/uL 0.9  1.6High   1.2High    Eosinophils Relative % 4  2  1    Eosinophils Absolute 0.0 - 0.5 K/uL 0.2  0.2  0.1   Basophils Relative % 1  1  0   Basophils Absolute 0.0 - 0.1 K/uL 0.1  0.1  0.0   Immature Granulocytes % 1  1  2    Abs Immature Granulocytes 0.00 - 0.07 K/uL 0.03  0.14High  CM  0.18High  CM   Comment: Performed at [facility], [address] [place], [place] [address]  Resulting Agency  [facility] CLIN LAB [facility]      Specimen Collected: 06/30/18 11:36 Last Resulted: 06/30/18 12:02

## 2018-09-27 ENCOUNTER — Telehealth: Payer: Self-pay

## 2018-09-27 NOTE — Telephone Encounter (Signed)
Refilled Hydrea 500 mg 1 tab po TID # 30 with no refills. The patient was made aware.

## 2018-09-29 NOTE — Progress Notes (Signed)
[facility]  [address] [place], [place] [address] Phone: [phone]  Fax: [phone]   Telemedicine Office Visit:  10/04/2018  Referring physician: Sallee Lange, *  I connected with Mary Villarreal [place] on 10/04/2018 at 11:16 AM by videoconferencing and verified that I was speaking with the correct person using 2 identifiers. The patient was at home. I discussed the limitations, risk, security and privacy concerns of performing an evaluation and management service by videoconferencing and the availability of an in person appointment. I also discussed with the patient that there may be a patient responsible charge related to this service. The patient expressed understanding and agreed to proceed.    Chief Complaint: Mary Villarreal is a 85 y.o. 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  HPI:  The patient was last seen in the hematology clinic by Beckey Rutter, NP on 06/10/2018. At that time, she denied any neurologic complaints, fevers or illnesses, easy bleeding or bruising.  She reported a good appetite and denied weight loss. She was taking Hydroxyurea 500 mg on 3 days a week.    Ferritin: 123 on 06/10/2018, 122 on 06/30/2018.  She received Venofer on 06/15/2018.   She contacted the clinic regarding unusual left leg pain that appeared to be moving.  She denied any shortness of breath or chest pain. LLE duplex on 07/14/2018 revealed no evidence of DVT.  Patient was seen for second opinion by Mary. Evelene Villarreal at [facility] on 08/08/2018.  She was felt to have primary myelofibrosis (DIPSS-plus intermediate-2 risk). She was to continue hydroxyurea 500 mg on 3 days a week and aspirin 81 mg/week. A switch to Mary Villarreal was discussed if the hydroxyurea stopped working or if the ophthalmologist thinks that there was more of an inflammatory problem with the cornea. She has a follow up on 12/12/2018.   She was seen by  Mary Villarreal, ophthalmologist at [facility], on 08/22/2018.  She was noted to have corneal edema OS- unsure of etiology, ?PBK; minimal guttae in right eye.  He added prednisolone acetate (Pred Forte 1%) TID OS and continued muro ointment qhs.  DMEK was discussed if no improvement with steroids.  She was to follow-up with local physician in 4 weeks for IOP check.  Labs followed: 06/10/2018: Hematocrit 28.8, hemoglobin 9.2, MCV 98.0, platelets 778,000, WBC 10,300.  Factor VIII 293. von Willebrand antigen 288.  06/30/2018: Hematocrit 30.4, hemoglobin 9.6, MCV 102.4, platelets 670,000, WBC 6,700.  Iron saturation 26% with a TIBC 364. Sed rate 26. B-12 988.   During the interim, she has felt okay. She reports eating a lot of spinach and drinking smoothies every week, which she attributes to not getting her oral iron refilled. She took oral iron from 06/02/2018 - 08/02/2018.   She notes constant fatigue that lasted 4 weeks after her Venofer treatment on 06/15/2018. Today, she notes feeling much better. She denies any fatigue, fevers, and chills.   She notes some weight loss and the lose of muscle mass. She walks using a cane. She notes that she can not smell but she can still taste. She reports a deviated septum. She denies any joint or back pain. She notes that she can no longer drive as her vision has worsened. She needs special glasses in order to read. She notes some skin discoloration on her hands.    Past Medical History:  Diagnosis Date   Aortic aneurysm (HCC)    Aortic aneurysm, abdominal (  [place])    Balance problems    Bladder prolapse, female, acquired    Coronary artery disease    GERD (gastroesophageal reflux disease)    Hyperlipidemia    Hypertension    TIA (transient ischemic attack)    Vision changes     Past Surgical History:  Procedure Laterality Date   ARTERY BIOPSY Left 05/13/2018   Procedure: BIOPSY TEMPORAL ARTERY LEFT;  Surgeon: Mary Sprague, DO;  Location: [facility] ORS;   Service: General;  Laterality: Left;   DG GALL BLADDER     EYE SURGERY     cataracts    History reviewed. No pertinent family history.  Social History:  reports that she quit smoking about 30 years ago. She has a 80.00 pack-year smoking history. She has never used smokeless tobacco. She reports current alcohol use of about 2.0 standard drinks of alcohol per week. She reports that she does not use drugs. The patient has smoked 2 ppd x 40 years (80 pack year smoking history).  She stopped smoking at the age of 64.  She drinks red wine nightly (1-1.5 glasses).  The patient is widowed.    She has dual citizenship with [place] and the [employer].  She is from [place].  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  She can no longer drive secondary to her vision.  She lives in [place]. The patient is accompanied by her friend, Mary Villarreal, today.  Participants in the patients visit and their role in the encounter included the patient, Mary Villarreal, and Mary Villarreal, CMA, today. The intake visit was provided by Mary Villarreal, [place].   Allergies: No Known Allergies  Current Medications: Current Outpatient Medications  Medication Sig Dispense Refill   Ascorbic Acid (VITAMIN C) 100 MG tablet Take 500 mg by mouth 2 (two) times daily.      aspirin 81 MG chewable tablet Chew 81 mg by mouth daily.      aspirin EC 81 MG tablet Take 81 mg by mouth daily.     CALCIUM PO Take 1 tablet by mouth 2 (two) times daily.      diltiazem (CARDIZEM CD) 240 MG 24 hr capsule Take 240 mg by mouth daily.     glucosamine-chondroitin 500-400 MG tablet Take 2 tablets by mouth daily.     hydroxyurea (HYDREA) 500 MG capsule TAKE ONE CAPSULE BY MOUTH 3 TIMES A WEEK. MAY TAKE WITH FOOD TO MINIMIZE GI SIDE EFFECTS 30 capsule 0   losartan  (COZAAR) 50 MG tablet Take 2 tablets by mouth 2 (two) times daily.   1   Multiple Vitamins-Minerals (MULTIVITAMIN ADULT PO) Take 1 tablet by mouth daily.      naproxen sodium (ALEVE) 220 MG tablet Take 220 mg by mouth 2 (two) times daily as needed.      simvastatin (ZOCOR) 20 MG tablet Take 20 mg by mouth daily at 6 PM.      vitamin B-12 (CYANOCOBALAMIN) 1000 MCG tablet Take 500 mcg by mouth 2 (two) times daily.      Vitamin D, Ergocalciferol, 2000 units CAPS Take 2,000 Units by mouth daily.      ferrous sulfate 325 (65 FE) MG EC tablet Take 1 tablet (325 mg total) by mouth daily with breakfast. (Patient not taking: Reported on 10/04/2018) 30 tablet 1   No  current facility-administered medications for this visit.     Review of Systems  Constitutional: Positive for malaise/fatigue. Negative for chills, fever and weight loss.       Feels "ok".  Overall, doing "much better".  Active.  HENT: Negative for congestion, hearing loss, sore throat and tinnitus.        Deviated Septum.  Eyes: Negative for blurred vision and double vision.       Vision worsening.  Respiratory: Negative for cough, hemoptysis, sputum production and shortness of breath.   Cardiovascular: Negative for chest pain, palpitations and leg swelling.  Gastrointestinal: Negative for abdominal pain, blood in stool, constipation, diarrhea, melena, nausea and vomiting.  Genitourinary: Negative for dysuria, frequency, hematuria and urgency.  Musculoskeletal: Negative for back pain, falls, joint pain, myalgias and neck pain.  Skin: Negative for itching and rash.       Discoloration on hands.  Neurological: Positive for sensory change (sense of smell). Negative for dizziness, tingling, weakness and headaches.       Wobbly, doesn't walk far without a cane.  Endo/Heme/Allergies: Does not bruise/bleed easily.       Allergies.  Psychiatric/Behavioral: Negative for depression and memory loss. The patient is not nervous/anxious and  does not have insomnia.   All other systems reviewed and are negative.  Performance status (ECOG): 1-2  Physical Exam  Constitutional: She is oriented to person, place, and time. She appears well-developed and well-nourished. No distress.  HENT:  Head: Normocephalic and atraumatic.  Short gray hair.  Eyes: Conjunctivae and EOM are normal. No scleral icterus.  Glasses.  Neurological: She is alert and oriented to person, place, and time.  Skin: She is not diaphoretic.  Psychiatric: She has a normal mood and affect. Her behavior is normal. Judgment and thought content normal.  Nursing note and vitals reviewed.   No visits with results within 3 Day(s) from this visit.  Latest known visit with results is:  Appointment on 06/30/2018  Component Date Value Ref Range Status   Vitamin B-12 06/30/2018 988* 180 - 914 pg/mL Final   Villarreal: (NOTE) This assay is not validated for testing neonatal or myeloproliferative syndrome specimens for Vitamin B12 levels. Performed at [facility], [place] [address] [place], [place] [address]    Iron 06/30/2018 94  28 - 170 ug/dL Final   TIBC 06/30/2018 364  250 - 450 ug/dL Final   Saturation Ratios 06/30/2018 26  10.4 - 31.8 % Final   UIBC 06/30/2018 270  ug/dL Final   Performed at [facility], [address] [place], [place] [address]   Ferritin 06/30/2018 122  11 - 307 ng/mL Final   Performed at [facility], [place], [place] [address]   Sodium 06/30/2018 136  135 - 145 mmol/L Final   Potassium 06/30/2018 4.0  3.5 - 5.1 mmol/L Final   Chloride 06/30/2018 101  98 - 111 mmol/L Final   CO2 06/30/2018 27  22 - 32 mmol/L Final   Glucose, Bld 06/30/2018 143* 70 - 99 mg/dL Final   BUN 06/30/2018 20  8 - 23 mg/dL Final   Creatinine, Ser 06/30/2018 0.99  0.44 - 1.00 mg/dL Final   Calcium 06/30/2018 8.8* 8.9 - 10.3 mg/dL Final   Total Protein 06/30/2018 6.6  6.5 - 8.1 g/dL Final   Albumin  06/30/2018 3.7  3.5 - 5.0 g/dL Final   AST 06/30/2018 28  15 - 41 U/L Final   ALT 06/30/2018 15  0 -  44 U/L Final   Alkaline Phosphatase 06/30/2018 65  38 - 126 U/L Final   Total Bilirubin 06/30/2018 0.6  0.3 - 1.2 mg/dL Final   GFR calc non Af Amer 06/30/2018 51* >60 mL/min Final   GFR calc Af Amer 06/30/2018 59* >60 mL/min Final   Anion gap 06/30/2018 8  5 - 15 Final   Performed at [facility], [address], [place], [place] [address]   WBC 06/30/2018 6.7  4.0 - 10.5 K/uL Final   RBC 06/30/2018 2.97* 3.87 - 5.11 MIL/uL Final   Hemoglobin 06/30/2018 9.6* 12.0 - 15.0 g/dL Final   HCT 06/30/2018 30.4* 36.0 - 46.0 % Final   MCV 06/30/2018 102.4* 80.0 - 100.0 fL Final   MCH 06/30/2018 32.3  26.0 - 34.0 pg Final   MCHC 06/30/2018 31.6  30.0 - 36.0 g/dL Final   RDW 06/30/2018 21.4* 11.5 - 15.5 % Final   Platelets 06/30/2018 670* 150 - 400 K/uL Final   nRBC 06/30/2018 0.0  0.0 - 0.2 % Final   Neutrophils Relative % 06/30/2018 64  % Final   Neutro Abs 06/30/2018 4.3  1.7 - 7.7 K/uL Final   Lymphocytes Relative 06/30/2018 17  % Final   Lymphs Abs 06/30/2018 1.2  0.7 - 4.0 K/uL Final   Monocytes Relative 06/30/2018 13  % Final   Monocytes Absolute 06/30/2018 0.9  0.1 - 1.0 K/uL Final   Eosinophils Relative 06/30/2018 4  % Final   Eosinophils Absolute 06/30/2018 0.2  0.0 - 0.5 K/uL Final   Basophils Relative 06/30/2018 1  % Final   Basophils Absolute 06/30/2018 0.1  0.0 - 0.1 K/uL Final   Immature Granulocytes 06/30/2018 1  % Final   Abs Immature Granulocytes 06/30/2018 0.03  0.00 - 0.07 K/uL Final   Performed at [facility], [address] [place], [place] [address]    Assessment:  Mary Villarreal is a 85 y.o. female with primary myelofibrosis,  JAK2 V617F mutated (DIPSS-plus intermediate-2 risk).  She has had thrombocytosis and anemia since 01/31/2017.  Platelet count has ranged between 671,000 - 896,000.  Hemoglobin has ranged  from 10.5 - 11.8.  RBC indices have been macrocytic since 04/26/2018.  She is a vegetarian. She denies any melena, hematochezia, hematuria or vaginal bleeding.  She denies pica.  Bone marrow on 05/19/2018 revealed a cellular marrow with a JAK2 positive myeloproliferative neoplasm.  The fragmented nature of the core biopsy, in combination with the positive spicular aspirate smears, hindered the assessment of this myeloproliferative neoplasm. There were increased megakaryocytes with atypical features including clustering, hyperchromatic, bulbous and hypolobated nuclei.  However, given the increased fibrosis (MF-1) and morphologic appearance of the megakaryocytes, early primary myelofibrosis (PMF) is favored over essential thrombocythemia (ET).  Flow cytometry was negative.  Cytogenetics were normal (16, XX).  IPPS score 0 (low risk).  Labs on on 04/18/2018 and 04/26/2018 revealed a normal creatinine and LFTs.  Sed rate and CRP were normal.  Iron saturation was 22% with a TIBC of 493.8 (high).  B12 was 284 (low) on 03/25/2015.  TSH was 1.279 on 03/25/2015.  Work-up on 05/03/2018 revealed a hematocrit of 31.5, hemoglobin 10.1, MCV 100.3, platelets 583,000, white count 7200 with an ANC of 4900.  JAK2 V617F was positive.  Normal studies included:  BCR-ABL, ANA, TSH, folate, CRP (< 0.8), and sed rate (21).  B12 was 272.  Ferritin was 35 with an iron saturation of 27% with a TIBC of 436.  Reticulocyte  count was 1.5%.   Patient has B12 deficiency.  She started on oral B12.  B12 was 272 on 05/03/2018 and 988 on 06/30/2018.  She received Venofer on 06/15/2018.   She has had declining vision in the left eye x 6 weeks.  She has corneal edema per optometry.  She has pulsing discomfort in the left temporal artery.  She was started on steroids on 05/10/2018.  Left temporal artery biopsy on 05/13/2018 revealed no evidence of giant cell arteritis.  Symptomatically, she is feeling better.  She is more active.  Vision  is worse.  She can no longer drive.  Plan: 1.    Labs requested:  CBC with diff, CMP, ferritin, iron studies, sed rate, LDH, epo level. 2.   Thrombocytosis and mild macrocytic anemia             Patient has primary myelofibrosis (PMF).             Patient currently taking hydroxyurea 500 mg 3 times a week.    Primary goal is symptom management.    Check epo level to determine the likelihood ESA response (if < 500).  Continue aspirin 81 mg/day.  Review interval second opinion with Mary. Adriana Simas.  Continue monitoring. 3.   Mild B12 deficiency             Patient on oral B12.             B12 level was 988 on 06/30/2018.  Check folate annually. 4.  Temporal artery pain and change in vision             Temporal artery biopsy revealed no evidence of giant cell arteritis.             Patient seen by ophthalmology at [facility].             Patient has corneal edema. 5.   Patient to call after her COVID-19 test results are available.  If negative, then come in for lab draw. 6.   RTC in 3 months for MD assessment and labs (CBC with diff, CMP, LDH, ferritin).  I discussed the assessment and treatment plan with the patient.  The patient was provided an opportunity to ask questions and all were answered.  The patient agreed with the plan and demonstrated an understanding of the instructions.  The patient was advised to call back if the symptoms worsen or if the condition fails to improve as anticipated.  I provided 25 minutes (11:16 AM - 11:40 AM) of face-to-face time during this this encounter and > 50% was spent counseling as documented under my assessment and plan.    Lequita Asal, MD, PhD    10/04/2018, 11:17 AM  I, Selena Batten, am acting as scribe for [employer]. Mike Gip, MD, PhD.  I, Loretha Ure C. Mike Gip, MD, have reviewed the above documentation for accuracy and completeness, and I agree with the above.

## 2018-10-03 ENCOUNTER — Other Ambulatory Visit: Payer: Self-pay

## 2018-10-04 ENCOUNTER — Inpatient Hospital Stay: Payer: Medicare Other | Attending: Hematology and Oncology | Admitting: Hematology and Oncology

## 2018-10-04 ENCOUNTER — Other Ambulatory Visit: Payer: Self-pay | Admitting: Hematology and Oncology

## 2018-10-04 ENCOUNTER — Encounter: Payer: Self-pay | Admitting: Hematology and Oncology

## 2018-10-04 DIAGNOSIS — E538 Deficiency of other specified B group vitamins: Secondary | ICD-10-CM

## 2018-10-04 DIAGNOSIS — D509 Iron deficiency anemia, unspecified: Secondary | ICD-10-CM

## 2018-10-04 DIAGNOSIS — D471 Chronic myeloproliferative disease: Secondary | ICD-10-CM

## 2018-10-04 DIAGNOSIS — D7581 Myelofibrosis: Secondary | ICD-10-CM

## 2018-10-04 MED ORDER — FERROUS SULFATE 325 (65 FE) MG PO TBEC: 325.0000 mg | DELAYED_RELEASE_TABLET | Freq: Every day | ORAL | 1 refills | Status: AC

## 2018-10-04 NOTE — Progress Notes (Signed)
No new changes noted today. The patient name and DOB has been verified by phone today. 

## 2018-10-19 ENCOUNTER — Encounter: Payer: Self-pay | Admitting: Nurse Practitioner

## 2018-11-08 ENCOUNTER — Ambulatory Visit: Admit: 2018-11-08 | Discharge: 2018-11-09 | Payer: MEDICARE

## 2018-11-08 DIAGNOSIS — D509 Iron deficiency anemia, unspecified: Secondary | ICD-10-CM

## 2018-11-08 DIAGNOSIS — D471 Chronic myeloproliferative disease: Secondary | ICD-10-CM

## 2018-11-09 ENCOUNTER — Telehealth: Admit: 2018-11-09 | Discharge: 2018-11-10 | Payer: MEDICARE | Attending: Adult Health | Primary: Adult Health

## 2018-11-09 DIAGNOSIS — D509 Iron deficiency anemia, unspecified: Secondary | ICD-10-CM

## 2018-11-09 DIAGNOSIS — D471 Chronic myeloproliferative disease: Secondary | ICD-10-CM

## 2018-12-23 ENCOUNTER — Ambulatory Visit: Admit: 2018-12-23 | Discharge: 2018-12-24 | Payer: MEDICARE

## 2018-12-26 ENCOUNTER — Ambulatory Visit: Admit: 2018-12-26 | Discharge: 2018-12-27 | Payer: MEDICARE | Attending: Internal Medicine | Primary: Internal Medicine

## 2018-12-26 ENCOUNTER — Ambulatory Visit: Admit: 2018-12-26 | Discharge: 2018-12-27 | Payer: MEDICARE

## 2018-12-26 DIAGNOSIS — D471 Chronic myeloproliferative disease: Principal | ICD-10-CM

## 2018-12-26 MED ORDER — HYDROXYUREA 500 MG CAPSULE
ORAL_CAPSULE | ORAL | 0 refills | 70 days | Status: CP
Start: 2018-12-26 — End: ?

## 2019-01-02 ENCOUNTER — Telehealth: Admit: 2019-01-02 | Discharge: 2019-01-03 | Payer: MEDICARE | Attending: Oncology | Primary: Oncology

## 2019-01-02 DIAGNOSIS — D471 Chronic myeloproliferative disease: Principal | ICD-10-CM

## 2019-01-02 MED ORDER — RUXOLITINIB 20 MG TABLET
ORAL_TABLET | Freq: Two times a day (BID) | ORAL | 2 refills | 30.00000 days | Status: CP
Start: 2019-01-02 — End: 2019-02-01
  Filled 2019-01-09: qty 60, 30d supply, fill #0

## 2019-01-03 ENCOUNTER — Ambulatory Visit: Payer: Medicare Other | Admitting: Hematology and Oncology

## 2019-01-03 ENCOUNTER — Other Ambulatory Visit: Payer: Medicare Other

## 2019-01-06 NOTE — Unmapped (Signed)
[facility]   Patient Onboarding/Medication Counseling    Hayley Wagner is a 85 y.o. female with primary myelofibrosis who I am counseling today on initiation of therapy.  I am speaking to the patient.    Verified patient's date of birth / HIPAA.    Specialty medication(s) to be sent: Hematology/Oncology: [name]      Non-specialty medications/supplies to be sent: none      Medications not needed at this time: none         Jakafi (ruxolitinib)    Medication & Administration     Dosage: Take 1 tablet (20 mg total) by mouth Two (2) times a day.    Administration:   ? Take with or without food    Adherence/Missed dose instructions:  ? Skip the missed dose and go back to your normal time.  ? Do not take 2 doses at the same time or extra doses.    Goals of Therapy   ? It is used to treat myelofibrosis.  ? It is used to treat polycythemia vera.  ? It is used to treat graft-versus-host disease (GVHD)    Side Effects & Monitoring Parameters   ? Common side effects  ? Diarrhea, constipation, stomach pain or gas  ? Dizziness  ? Headache  ? Muscle spasm  ? Weight gain  ? Nose or throat irritation  ? Joint pain    ? The following side effects should be reported to the provider  ? Allergic reaction  ? Infection like symptoms  ? Unexplained bleeding or bruising  ? Painful skin rash with blisters  ? Upset stomach or vomiting  ? Shortness of breath  ? Feeling tired or weak  ? Swelling  ? Confusion, memory problems, low mood, changes in behavior, change in strength on 1 side is greater than the other, trouble speaking or thinking, change in balance, or change in eyesight.  ? Change in color or size of a mole, any new or changing skin lump or growth    ? Monitoring Parameters:  ? CBC baseline and every 2-4 weeks  ? Lipid panels 8-12 weeks after therapy initiated  ? TB skin test prior to starting    Contraindications, Warnings, & Precautions ? Hematologic toxicity, including thrombocytopenia, anemia, and neutropenia  ? Infections  ? Lipid abnormalities  ? Non-melanoma skin cancer  ? Use caution in patients with hepatic or renal impairment  ? Withdrawal syndrome - fever, respiratory distress, hypotension, multiorgan failure, splenomegaly, worsening cytopenias, hemodynamic compensation, and septic shock-like syndrome have been reported with treatment tapering or discontinuation. Patients should not interrupt/discontinue treatment without consulting a healthcare provider.    Pregnancy & Lactation Considerations  ? Not recommended for use during pregnancy  ? Breastfeeding is not recommended during treatment and for 2 weeks after the last dose    Drug/Food Interactions     ? Medication list reviewed in Epic. The patient was instructed to inform the care team before taking any new medications or supplements. No drug interactions identified.   ? Avoid grapefruit juice  ? Ask your doctor before getting any live or inactivated vaccinations    Storage, Handling Precautions, & Disposal     ? Store at room temperature  ? Keep away from children and pets  ? Caregivers recommended to wear gloves when handling medication    Current Medications (including OTC/herbals), Comorbidities and Allergies     Current Outpatient Medications   Medication Sig Dispense Refill   ??? ascorbate calcium,  bulk, Powd Take by mouth.     ??? aspirin 81 MG chewable tablet Chew.     ??? betamethasone valerate (VALISONE) 0.1 % lotion Apply topically.     ??? diltiazem (CARDIZEM CD) 240 MG 24 hr capsule      ??? ERGOCALCIFEROL, VITAMIN D2, ORAL Take 1 capsule by mouth.     ??? hydrocortisone 2.5 % cream Apply topically.     ??? hydroxyurea (HYDREA) 500 mg capsule Take 1 capsule (500 mg total) by mouth 3 (three) times a week. 1 tab Monday, Wednesday, and Friday. 30 capsule 0   ??? ibuprofen (MOTRIN) 800 MG tablet      ??? losartan-hydrochlorothiazide (HYZAAR) 50-12.5 mg per tablet Take 1 tablet by mouth. ??? metroNIDAZOLE (METROGEL) 1 % gel Small amount as needed twice a day     ??? multivitamin (TAB-A-VITE/THERAGRAN) per tablet Take 1 tablet by mouth.     ??? mupirocin (BACTROBAN) 2 % ointment Apply 1 application topically 2 (two) times daily. Behind R ear     ??? naproxen sodium (ALEVE) 220 MG tablet Take 220 mg by mouth.     ??? predniSONE (DELTASONE) 20 MG tablet Take 40 mg by mouth.     ??? ruxolitinib (JAKAFI) 20 mg tablet Take 1 tablet (20 mg total) by mouth Two (2) times a day. 60 tablet 2   ??? simvastatin (ZOCOR) 20 MG tablet TAKE 1 TABLET BY MOUTH ONCE DAILY       No current facility-administered medications for this visit.        No Known Allergies    Patient Active Problem List   Diagnosis   ??? 2-vessel coronary artery disease   ??? Thoracoabdominal aneurysm (CMS-HCC)   ??? Chest pain, unspecified   ??? Chronic kidney disease, stage 3   ??? Descending thoracic aortic aneurysm (CMS-HCC)   ??? Ectatic thoracic aorta (CMS-HCC)   ??? Essential hypertension   ??? Hyperlipidemia   ??? IGT (impaired glucose tolerance)   ??? Increased frequency of urination   ??? Laxity of skin   ??? Palpitations   ??? Rectocele, female   ??? Rosacea   ??? Primary myelofibrosis (CMS-HCC)       Reviewed and up to date in Epic.    Appropriateness of Therapy     Is medication and dose appropriate based on diagnosis? Yes    Baseline Quality of Life Assessment      How many days over the past month did your myelofibrosis keep you from your normal activities? 0    Financial Information     Medication Assistance provided: Prior Authorization and [employer]    Anticipated copay of $0 / 30 days reviewed with patient. Verified delivery address.    Delivery Information     Scheduled delivery date: 01/09/19    Expected start date: 01/09/19    Medication will be delivered via Same Day Courier to the prescription address in [facility].  This shipment will not require a signature. Explained the services we provide at [facility] and that each month we would call to set up refills.  Stressed importance of returning phone calls so that we could ensure they receive their medications in time each month.  Informed patient that we should be setting up refills 7-10 days prior to when they will run out of medication.  A pharmacist will reach out to perform a clinical assessment periodically.  Informed patient that a welcome packet and a drug information handout will be sent.  Patient verbalized understanding of the above information as well as how to contact the pharmacy at [phone] option 4 with any questions/concerns.  The pharmacy is open Monday through Friday 8:30am-4:30pm.  A pharmacist is available 24/7 via pager to answer any clinical questions they may have.    Patient Specific Needs     ? Does the patient have any physical, cognitive, or cultural barriers? No    ? Patient prefers to have medications discussed with  Patient     ? Is the patient able to read and understand education materials at a high school level or above? Yes    ? Patient's primary language is  English     ? Is the patient high risk? No     ? Does the patient require a Care Management Plan? No     ? Does the patient require physician intervention or other additional services (i.e. nutrition, smoking cessation, social work)? No      Aliena Ghrist [facility] Specialty Pharmacist

## 2019-01-06 NOTE — Unmapped (Signed)
[facility] Specialty Medication Onboarding    Specialty Medication: JAKAFI  Prior Authorization: Approved   Financial Assistance: Yes - grant approved as secondary   Final Copay/Day Supply: $0 / 30 DAYS    Insurance Restrictions: None     Notes to Pharmacist:     The triage team has completed the benefits investigation and has determined that the patient is able to fill this medication at [facility]. Please contact the patient to complete the onboarding or follow up with the prescribing physician as needed.

## 2019-01-09 MED FILL — JAKAFI 20 MG TABLET: 30 days supply | Qty: 60 | Fill #0 | Status: AC

## 2019-01-24 ENCOUNTER — Ambulatory Visit: Admit: 2019-01-24 | Discharge: 2019-01-25 | Payer: MEDICARE

## 2019-01-24 LAB — CBC W/ AUTO DIFF
BASOPHILS ABSOLUTE COUNT: 0 10*9/L (ref 0.0–0.1)
BASOPHILS RELATIVE PERCENT: 0.4 %
EOSINOPHILS ABSOLUTE COUNT: 0 10*9/L (ref 0.0–0.7)
EOSINOPHILS RELATIVE PERCENT: 0.3 %
HEMOGLOBIN: 10.1 g/dL — ABNORMAL LOW (ref 12.0–15.5)
LYMPHOCYTES RELATIVE PERCENT: 51.4 %
MEAN CORPUSCULAR HEMOGLOBIN CONC: 33.7 g/dL (ref 30.0–36.0)
MEAN CORPUSCULAR HEMOGLOBIN: 34.8 pg — ABNORMAL HIGH (ref 26.0–34.0)
MEAN CORPUSCULAR VOLUME: 103 fL — ABNORMAL HIGH (ref 82.0–98.0)
MEAN PLATELET VOLUME: 9.4 fL (ref 7.0–10.0)
MONOCYTES ABSOLUTE COUNT: 0.3 10*9/L (ref 0.1–1.0)
MONOCYTES RELATIVE PERCENT: 10.6 %
NEUTROPHILS ABSOLUTE COUNT: 1 10*9/L — ABNORMAL LOW (ref 1.7–7.7)
NEUTROPHILS RELATIVE PERCENT: 37.3 %
RED BLOOD CELL COUNT: 2.91 10*12/L — ABNORMAL LOW (ref 3.90–5.03)
RED CELL DISTRIBUTION WIDTH: 18.5 % — ABNORMAL HIGH (ref 12.0–15.0)
WBC ADJUSTED: 2.7 10*9/L — ABNORMAL LOW (ref 3.5–10.5)

## 2019-01-24 LAB — COMPREHENSIVE METABOLIC PANEL
ALKALINE PHOSPHATASE: 70 U/L (ref 38–126)
ALT (SGPT): 25 U/L (ref <35)
AST (SGOT): 47 U/L — ABNORMAL HIGH (ref 14–38)
BILIRUBIN TOTAL: 0.6 mg/dL (ref 0.0–1.2)
BLOOD UREA NITROGEN: 30 mg/dL — ABNORMAL HIGH (ref 7–21)
BUN / CREAT RATIO: 31
CALCIUM: 9.6 mg/dL (ref 8.5–10.2)
CHLORIDE: 110 mmol/L — ABNORMAL HIGH (ref 98–107)
CO2: 29 mmol/L (ref 22.0–30.0)
CREATININE: 0.96 mg/dL (ref 0.60–1.00)
EGFR CKD-EPI AA FEMALE: 61 mL/min/{1.73_m2} (ref >=60)
EGFR CKD-EPI NON-AA FEMALE: 53 mL/min/{1.73_m2} — ABNORMAL LOW (ref >=60)
GLUCOSE RANDOM: 88 mg/dL (ref 70–179)
POTASSIUM: 5.3 mmol/L — ABNORMAL HIGH (ref 3.5–5.0)
PROTEIN TOTAL: 6.8 g/dL (ref 6.5–8.3)
SODIUM: 143 mmol/L (ref 135–145)

## 2019-01-24 LAB — ANISOCYTOSIS

## 2019-01-24 LAB — LACTATE DEHYDROGENASE
LACTATE DEHYDROGENASE: 1437 U/L — ABNORMAL HIGH (ref 338–610)
Lactate dehydrogenase:CCnc:Pt:Ser/Plas:Qn:: 1437 — ABNORMAL HIGH

## 2019-01-24 LAB — AST (SGOT): Aspartate aminotransferase:CCnc:Pt:Ser/Plas:Qn:: 47 — ABNORMAL HIGH

## 2019-01-30 NOTE — Unmapped (Signed)
[facility] Clinical Assessment & Refill Coordination Note    Hayley Wagner, DOB: [birth year]  Phone: [phone] (home)     All above HIPAA information was verified with patient.     Was a Nurse, learning disability used for this call? No    Specialty Medication(s):   Hematology/Oncology: [id number]     Current Outpatient Medications   Medication Sig Dispense Refill   ??? ascorbate calcium, bulk, Powd Take by mouth.     ??? aspirin 81 MG chewable tablet Chew.     ??? betamethasone valerate (VALISONE) 0.1 % lotion Apply topically.     ??? diltiazem (CARDIZEM CD) 240 MG 24 hr capsule      ??? ERGOCALCIFEROL, VITAMIN D2, ORAL Take 1 capsule by mouth.     ??? hydrocortisone 2.5 % cream Apply topically.     ??? hydroxyurea (HYDREA) 500 mg capsule Take 1 capsule (500 mg total) by mouth 3 (three) times a week. 1 tab Monday, Wednesday, and Friday. 30 capsule 0   ??? ibuprofen (MOTRIN) 800 MG tablet      ??? losartan-hydrochlorothiazide (HYZAAR) 50-12.5 mg per tablet Take 1 tablet by mouth.     ??? metroNIDAZOLE (METROGEL) 1 % gel Small amount as needed twice a day     ??? multivitamin (TAB-A-VITE/THERAGRAN) per tablet Take 1 tablet by mouth.     ??? mupirocin (BACTROBAN) 2 % ointment Apply 1 application topically 2 (two) times daily. Behind R ear     ??? naproxen sodium (ALEVE) 220 MG tablet Take 220 mg by mouth.     ??? predniSONE (DELTASONE) 20 MG tablet Take 40 mg by mouth.     ??? ruxolitinib (JAKAFI) 20 mg tablet Take 1 tablet (20 mg total) by mouth Two (2) times a day. 60 tablet 2   ??? simvastatin (ZOCOR) 20 MG tablet TAKE 1 TABLET BY MOUTH ONCE DAILY       No current facility-administered medications for this visit.         Changes to medications: Daneya reports no changes at this time.    No Known Allergies    Changes to allergies: No    SPECIALTY MEDICATION ADHERENCE     Jakafi 20 mg: 10 days of medicine on hand     Medication Adherence    Patient reported X missed doses in the last month: 0 Specialty Medication: Jakafi 20mg   Informant: patient  Confirmed plan for next specialty medication refill: delivery by pharmacy          Specialty medication(s) dose(s) confirmed: Regimen is correct and unchanged.     Are there any concerns with adherence? No    Adherence counseling provided? Not needed    CLINICAL MANAGEMENT AND INTERVENTION      Clinical Benefit Assessment:    Do you feel the medicine is effective or helping your condition? Yes    Clinical Benefit counseling provided? Not needed    Adverse Effects Assessment:    Are you experiencing any side effects? Yes, patient reports experiencing BP staying a little elevated (readings have reached up to 130-142/70's).  Reports constipation.. Side effect counseling provided: Continue logging readings and report if consistently staying high in the 140's/80's.  For constipation: increase water intake and may add Miralax if necessary.    Are you experiencing difficulty administering your medicine? No    Quality of Life Assessment:    How many days over the past month did your myelofibrosis  keep you from your normal activities? For  example, brushing your teeth or getting up in the morning. 0    Have you discussed this with your provider? Not needed    Therapy Appropriateness:    Is therapy appropriate? Yes, therapy is appropriate and should be continued    DISEASE/MEDICATION-SPECIFIC INFORMATION      N/A    PATIENT SPECIFIC NEEDS     ? Does the patient have any physical, cognitive, or cultural barriers? No    ? Is the patient high risk? Yes, patient is taking oral chemotherapy. Appropriateness of therapy as been assessed.     ? Does the patient require a Care Management Plan? No     ? Does the patient require physician intervention or other additional services (i.e. nutrition, smoking cessation, social work)? No      SHIPPING     Specialty Medication(s) to be Shipped:   Hematology/Oncology: Jakafi    Other medication(s) to be shipped: none Changes to insurance: No    Delivery Scheduled: Yes, Expected medication delivery date: 02/06/19.     Medication will be delivered via Same Day Courier to the confirmed prescription address in [place].    The patient will receive a drug information handout for each medication shipped and additional FDA Medication Guides as required.  Verified that patient has previously received a Conservation officer, historic buildings.    All of the patient's questions and concerns have been addressed.    Breck Coons [facility] Specialty Pharmacist

## 2019-02-02 DIAGNOSIS — D471 Chronic myeloproliferative disease: Principal | ICD-10-CM

## 2019-02-02 MED ORDER — RUXOLITINIB 5 MG TABLET
ORAL_TABLET | Freq: Two times a day (BID) | ORAL | 0 refills | 30 days | Status: CP
Start: 2019-02-02 — End: 2019-03-04
  Filled 2019-02-03: qty 120, 30d supply, fill #0

## 2019-02-02 NOTE — Unmapped (Signed)
Clinical Pharmacist Practitioner: Marrow Failure Clinic       Patient Information: Hayley Wagner  is a 85 y.o. year-old female with  primary myelofibrosis   who started ruxolitinib therapy on 01/09/19. I called patient via the phone for follow-up of ruxolitinib.    Dose: Ruxolitinib 20 mg two times daily    A/P:   -Hayley Wagner is not currently tolerating ruxolitinib at current dosing of 20mg  twice daily  -Ruxolitinib dose wll be adjusted to 10 mg twice daily  -cbc in 2 weeks, at [facility]    Prescriptions:  - ruxolitinib 5mg  #120 (30 days supply, 0 refills)  -Prescriptions sent electronically to patient's preferred pharmacy [facility])      Counseled patient on:  -dose adjustment to 10mg  twice daily  -change of tablet size to 5 mg and dose of two of 5mg  tablets twice daily  -estimated delivery of 5mg  tablets to arrive on Friday 12/11  -cbc the week after X'mas    Hayley Wagner verbalized understanding of the treatment plan provided and had no further questions.       F/u: 02/07/19 with Markus Jarvis AGNP    ________________________________________________________________________________________________    Interim history:  Patient reported following:  -some improvement in fatigue (previously severe fatigue, could sleep whole day)  -improvement in severity of night sweats (previously had to dry her hair)  -severe constipation, high pressure 130s/70s    Pharmacy: [facility]    Adherence: No barriers identified. Patient self-manages and uses a pill box to organize her medications.     Medication access:   Insurance [employer] Medicare plan  Copay: $2,172.27  Copay assistance: PANF ($8500, may be renewed x 1, Medicare catastrophic phase would probaby be reached by then)    Labs:       Myeloproliferative Neoplasm Symptom Assessment (MSN-SAF): Not assessed today. However, patient endorses significant fatigue: I could lay in the road and fall asleep for 12 hours, improved severity of night sweats Approximate telephone time spent with patient: 15 minutes  Approximate time spent coordinating care: 10 minutes    [name], PharmD, BCOP, CPP  Clinical Pharmacist Practitioner, Benign Hematology

## 2019-02-03 MED FILL — JAKAFI 5 MG TABLET: 30 days supply | Qty: 120 | Fill #0 | Status: AC

## 2019-02-03 NOTE — Unmapped (Signed)
Ms. Matylda Nestler's Jakafi dose has decreased from Jakafi 20mg  bid to Jakafi 10 mg bid.  Patient is aware of change in dose.  Delivery is rescheduled as same day deliver via courier to the prescription address on an agreed upon date of 02/03/19.      Horace Porteous, PharmD  [facility]

## 2019-02-07 ENCOUNTER — Telehealth: Admit: 2019-02-07 | Discharge: 2019-02-08 | Payer: MEDICARE | Attending: Adult Health | Primary: Adult Health

## 2019-02-07 MED ORDER — FAMOTIDINE 20 MG TABLET
ORAL_TABLET | Freq: Two times a day (BID) | ORAL | 11 refills | 30 days | Status: CP
Start: 2019-02-07 — End: 2020-02-07

## 2019-02-07 NOTE — Unmapped (Signed)
Myeloproliferative Neoplasms & Bone Marrow Failure Clinic Established Visit    Patient: Hayley Wagner  MRN: [id number]  DOB: [birth year]  Date of Visit: 02/07/2019    Referred By: Myrene Buddy, FNP  [address]  [facility] ??? PRIMARY CARE  [place],  [place] [address]       This patient visit was completed through the use of an audio/video or telephone encounter.    I spent 25 minutes on the real-time audio and video with the patient. I spent an additional 15 minutes on pre- and post-visit activities.     The patient was physically located in [place] or a state in which I am permitted to provide care. The patient understood that s/he may incur co-pays and cost sharing, and agreed to the telemedicine visit. The visit was completed via phone and/or video, which was appropriate and reasonable under the circumstances given the patient's presentation at the time.    The patient has been advised of the potential risks and limitations of this mode of treatment (including, but not limited to, the absence of in-person examination) and has agreed to be treated using telemedicine. The patient's/patient's family's questions regarding telemedicine have been answered.     If the phone/video visit was completed in an ambulatory setting, the patient has also been advised to contact their provider???s office for worsening conditions, and seek emergency medical treatment and/or call 911 if the patient deems either necessary.    Visit conducted by: Video  Person contacted: Georgette Shell Leiner  Contact phone number: [phone]  Is there someone else in the room? yes  What is the relationship? friend  Do you want the person here for the visit?  yes         Reason for Visit   Rilie Glanz is a 85 y.o. 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    Assessment   #1 Primary myelofibrosis, JAK2 V617F-mutated  Dx:  05/19/2018  WHO 2016 criteria met: 1) bone marrow findings with MF-2, 2) JAK2 mutation, 3) doesn't meet criteria for ET, PV, CML, 4) LDH increased, 5) nucleated RBCs, 6) anemia not attributed to a comorbid condition  Driver Mutation: JAK2 [id number]  Prognostic Scores:  DIPSS: Int-2  DIPSS Plus: Int-2, median OS 2.9 years.   Not enough information for MIPSS 70 or GIPSS  Treatment: Ruxolitinib 10 mg PO BID    Ms. Gloster is doing well.  She notes an improvement of symptoms since starting the Rux, and also an improvement since decreasing her dose to 10 mg.  She no longer has night sweats, erythromelalgia.  Biggest complaint is constipation.  She is pretty vigilant about taking miralax, eating high fiber foods, and drinking prune juice.  Advised her she can try senokot-s occasionally if not moving her bowels well.    She is getting labs done 12/29.  Will review those - likely platelets will come back up given decrease in dose since those labs were drawn.    Plan   1. Continue ruxolitinib 10mg  BID  2. Continue ASA 81mg  po daily for thrombosis prophylaxis    3. CBC-D and CMP in [facility] monthly for monitoring  4. Start pepcid 20mg  PO BID for reflux  5. Consider Zoster vaccination with increased risk of reactivation  6. Continue to hold oral iron for now given GI side effects, will monitor need for IV infusion going forward   7. RTC in 6 weeks with video visit  Markus Jarvis, RN, MSN, AGPCNP-C  Nurse Practitioner  Hematologic Malignancies  [facility]  [phone] (phone)  [phone] (fax)  [email or web address]      Interval History:  Doing better than at her previous visit.  Was initially started at rux 20mg  BID and was just reduced to 10mg  BID.  Currently feels quite stressed with family issues.  BP is sporadically high.  Weakness and fatigue are improved, along with night sweats.  Constipation is an issue - taking stools softener, miralax, prune juice.  Acid reflux is a big problem for her.  Erythromelalgia has resolved.      Otherwise, denies new bone pain, fevers, chills, night sweats, lumps/bumps, tongue swelling, shortness of breath, syncope, lightheadedness, constipation or diarrhea, nausea or vomiting, very easy bruising or bleeding, or urinary changes.       History of Present Illness   85 y.o.  with CAD, thoracoabnodmal aneurysm, HTN, CKD stage 3, iron-defiency anemia, and JAK2+ MPN.    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  Still does have pain in both sides, but overall improving.  Had a temporal artery biopsy - was negative.  Had been started on steroids prior to the biopsy (but she goes nuts on steroids) but says that she felt no better while on prednisone.  She reports that her ophthalmologist said that she has corneal edema and there is swelling in the left. She is frustrated because can't really read anymore and can't drive.      Her biggest complaint, though is that in March 2020, she developed significant fatigue and weakness, citing that she slept for a couple of weeks.  Didn't even feel improved after IV iron infusion on 06/15/2018.  However, as of 4 weeks ago, she feels back to her baseline.  She does not know what has changed to elicit this response.  She did not have fevers.  She also started hydroxyurea in late April/early May, so possibly was related to this.  The last CBC was from 06/30/2018 and showed WBC 6.7, hgb 9.6, MCV 102, platelets 670.        Review of Systems   10 point ROS otherwise negative unless mentioned in history.     Medications     Current Outpatient Medications   Medication Sig Dispense Refill   ??? ascorbate calcium, bulk, Powd Take by mouth.     ??? aspirin 81 MG chewable tablet Chew.     ??? diltiazem (CARDIZEM CD) 240 MG 24 hr capsule      ??? ERGOCALCIFEROL, VITAMIN D2, ORAL Take 1 capsule by mouth.     ??? ibuprofen (MOTRIN) 800 MG tablet      ??? losartan-hydrochlorothiazide (HYZAAR) 50-12.5 mg per tablet Take 1 tablet by mouth.     ??? multivitamin (TAB-A-VITE/THERAGRAN) per tablet Take 1 tablet by mouth.     ??? naproxen sodium (ALEVE) 220 MG tablet Take 220 mg by mouth.     ??? RESTASIS MULTIDOSE 0.05 % Drop      ??? ruxolitinib (JAKAFI) 5 mg tablet Take 2 tablets (10 mg total) by mouth Two (2) times a day. 120 tablet 0   ??? simvastatin (ZOCOR) 20 MG tablet TAKE 1 TABLET BY MOUTH ONCE DAILY     ??? betamethasone valerate (VALISONE) 0.1 % lotion Apply topically.     ??? hydrocortisone 2.5 % cream Apply topically.     ??? hydroxyurea (HYDREA) 500 mg capsule  Take 1 capsule (500 mg total) by mouth 3 (three) times a week. 1 tab Monday, Wednesday, and Friday. (Patient not taking: Reported on 02/07/2019) 30 capsule 0   ??? metroNIDAZOLE (METROGEL) 1 % gel Small amount as needed twice a day     ??? mupirocin (BACTROBAN) 2 % ointment Apply 1 application topically 2 (two) times daily. Behind R ear     ??? predniSONE (DELTASONE) 20 MG tablet Take 40 mg by mouth.       No current facility-administered medications for this visit.        Allergies   No Known Allergies    Past Medical and Surgical History     Past Medical History:   Diagnosis Date   ??? CAD (coronary artery disease)    ??? Hypertension      No past surgical history on file.    Social History     Social History     Socioeconomic History   ??? Marital status: Widowed     Spouse name: Not on file   ??? Number of children: Not on file   ??? Years of education: Not on file   ??? Highest education level: Not on file   Occupational History   ??? Not on file   Social Needs   ??? Financial resource strain: Not on file   ??? Food insecurity     Worry: Not on file     Inability: Not on file   ??? Transportation needs     Medical: Not on file     Non-medical: Not on file   Tobacco Use   ??? Smoking status: Former Smoker   ??? Smokeless tobacco: Never Used   Substance and Sexual Activity   ??? Alcohol use: Yes   ??? Drug use: Not on file   ??? Sexual activity: Not on file   Lifestyle   ??? Physical activity     Days per week: Not on file     Minutes per session: Not on file   ??? Stress: Not on file   Relationships   ??? Social Wellsite geologist on phone: Not on file     Gets together: Not on file     Attends religious service: Not on file     Active member of club or organization: Not on file     Attends meetings of clubs or organizations: Not on file     Relationship status: Not on file   Other Topics Concern   ??? Not on file   Social History Narrative   ??? Not on file   Lives independently with two cats.  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      Family History   Sister with Alzheimer disease  No known blood cancers    Physical Examination     There were no vitals filed for this visit.  LIMITED EXAM DUE TO ATTEMPT TO LIMIT CONTACT DUE TO CORONAVIRUS IN THE COMMUNITY    GENERAL:  The patient appears well and is in no acute distress  HEENT: No scleral icterus.  RESP: Breathing comfortably and unlabored speech.    NEURO: A&Ox4  PSYCH: Normal affect      Laboratory Testing and Imaging     Results for orders placed or performed in visit on 01/24/19   Lactate dehydrogenase   Result Value Ref Range  LDH 1,437 (H) 338 - 610 U/L   Comprehensive Metabolic Panel   Result Value Ref Range    Sodium 143 135 - 145 mmol/L    Potassium 5.3 (H) 3.5 - 5.0 mmol/L    Chloride 110 (H) 98 - 107 mmol/L    Anion Gap 4 (L) 7 - 15 mmol/L    CO2 29.0 22.0 - 30.0 mmol/L    BUN 30 (H) 7 - 21 mg/dL    Creatinine 8.11 9.14 - 1.00 mg/dL    BUN/Creatinine Ratio 31     EGFR CKD-EPI Non-African American, Female 53 (L) >=60 mL/min/1.78m2    EGFR CKD-EPI African American, Female 53 >=60 mL/min/1.67m2    Glucose 88 70 - 179 mg/dL    Calcium 9.6 8.5 - 78.2 mg/dL    Albumin 4.4 3.5 - 5.0 g/dL    Total Protein 6.8 6.5 - 8.3 g/dL    Total Bilirubin 0.6 0.0 - 1.2 mg/dL    AST 47 (H) 14 - 38 U/L    ALT 25 <35 U/L    Alkaline Phosphatase 70 38 - 126 U/L   CBC w/ Differential   Result Value Ref Range    WBC 2.7 (L) 3.5 - 10.5 10*9/L    RBC 2.91 (L) 3.90 - 5.03 10*12/L    HGB 10.1 (L) 12.0 - 15.5 g/dL    HCT 95.6 (L) 21.3 - 44.0 %    MCV 103.0 (H) 82.0 - 98.0 fL    MCH 34.8 (H) 26.0 - 34.0 pg    MCHC 33.7 30.0 - 36.0 g/dL    RDW 08.6 (H) 57.8 - 15.0 %    MPV 9.4 7.0 - 10.0 fL    Platelet 186 150 - 450 10*9/L    Neutrophils % 37.3 %    Lymphocytes % 51.4 %    Monocytes % 10.6 %    Eosinophils % 0.3 %    Basophils % 0.4 %    Absolute Neutrophils 1.0 (L) 1.7 - 7.7 10*9/L    Absolute Lymphocytes 1.4 0.7 - 4.0 10*9/L    Absolute Monocytes 0.3 0.1 - 1.0 10*9/L    Absolute Eosinophils 0.0 0.0 - 0.7 10*9/L    Absolute Basophils 0.0 0.0 - 0.1 10*9/L    Anisocytosis Slight (A) Not Present

## 2019-02-07 NOTE — Unmapped (Signed)
Reviewed Meds and Allergies, confirmed pharmacy and informed how to use Doximity.

## 2019-02-08 NOTE — Unmapped (Signed)
I spoke with patient Hayley Wagner to confirm appointments on the following date(s): 12/29 Labs, 1/26 [place] video visit, 3/16 Labs, 3/22 [name] video visit    Almira Coaster L Powe

## 2019-02-21 ENCOUNTER — Ambulatory Visit: Admit: 2019-02-21 | Discharge: 2019-02-22 | Payer: MEDICARE

## 2019-02-21 LAB — COMPREHENSIVE METABOLIC PANEL
ALBUMIN: 4 g/dL (ref 3.5–5.0)
ALKALINE PHOSPHATASE: 83 U/L (ref 38–126)
ALT (SGPT): 19 U/L (ref <35)
ANION GAP: 7 mmol/L (ref 7–15)
AST (SGOT): 34 U/L (ref 14–38)
BILIRUBIN TOTAL: 0.6 mg/dL (ref 0.0–1.2)
BUN / CREAT RATIO: 27
CALCIUM: 9.1 mg/dL (ref 8.5–10.2)
CHLORIDE: 106 mmol/L (ref 98–107)
CO2: 28 mmol/L (ref 22.0–30.0)
CREATININE: 0.9 mg/dL (ref 0.60–1.00)
EGFR CKD-EPI AA FEMALE: 66 mL/min/{1.73_m2} (ref >=60)
EGFR CKD-EPI NON-AA FEMALE: 57 mL/min/{1.73_m2} — ABNORMAL LOW (ref >=60)
GLUCOSE RANDOM: 94 mg/dL (ref 70–179)
POTASSIUM: 4.8 mmol/L (ref 3.5–5.0)
PROTEIN TOTAL: 6.4 g/dL — ABNORMAL LOW (ref 6.5–8.3)
SODIUM: 141 mmol/L (ref 135–145)

## 2019-02-21 LAB — MANUAL DIFFERENTIAL
LYMPHOCYTES - ABS (DIFF): 0.7 10*9/L — ABNORMAL LOW (ref 1.5–5.0)
MONOCYTES - ABS (DIFF): 0.3 10*9/L (ref 0.2–0.8)
MONOCYTES - REL (DIFF): 8 %
NEUTROPHILS - ABS (DIFF): 2.4 10*9/L (ref 2.0–7.5)
NEUTROPHILS - REL (DIFF): 72 %

## 2019-02-21 LAB — CBC W/ AUTO DIFF
BASOPHILS ABSOLUTE COUNT: 0 10*9/L (ref 0.0–0.1)
BASOPHILS RELATIVE PERCENT: 0.7 %
EOSINOPHILS ABSOLUTE COUNT: 0 10*9/L (ref 0.0–0.7)
EOSINOPHILS RELATIVE PERCENT: 0.5 %
HEMATOCRIT: 21 % — ABNORMAL LOW (ref 35.0–44.0)
HEMOGLOBIN: 7.2 g/dL — ABNORMAL LOW (ref 12.0–15.5)
LYMPHOCYTES ABSOLUTE COUNT: 0.9 10*9/L (ref 0.7–4.0)
LYMPHOCYTES RELATIVE PERCENT: 25.6 %
MEAN CORPUSCULAR HEMOGLOBIN CONC: 34.4 g/dL (ref 30.0–36.0)
MEAN CORPUSCULAR HEMOGLOBIN: 34.5 pg — ABNORMAL HIGH (ref 26.0–34.0)
MEAN CORPUSCULAR VOLUME: 100.3 fL — ABNORMAL HIGH (ref 82.0–98.0)
MEAN PLATELET VOLUME: 9.4 fL (ref 7.0–10.0)
MONOCYTES ABSOLUTE COUNT: 0.8 10*9/L (ref 0.1–1.0)
MONOCYTES RELATIVE PERCENT: 22.8 %
NEUTROPHILS RELATIVE PERCENT: 50.4 %
RED BLOOD CELL COUNT: 2.09 10*12/L — ABNORMAL LOW (ref 3.90–5.03)
RED CELL DISTRIBUTION WIDTH: 17 % — ABNORMAL HIGH (ref 12.0–15.0)
WBC ADJUSTED: 3.3 10*9/L — ABNORMAL LOW (ref 3.5–10.5)

## 2019-02-21 LAB — EGFR CKD-EPI AA FEMALE: Lab: 66

## 2019-02-21 LAB — NEUTROPHILS - ABS (DIFF): Neutrophils:NCnc:Pt:Bld:Qn:: 2.4

## 2019-02-21 LAB — HEMATOCRIT: Hematocrit:VFr:Pt:Bld:Qn:: 21 — ABNORMAL LOW

## 2019-02-21 NOTE — Unmapped (Signed)
Started Ruxolitinib 20mg  po BID on 01/09/2019.  CBC just prior: WBC 7.5, ANC 4.8, Hgb 11.3, Plts 639.   On 01/24/2019:  CBC: WBC 2.7, Hgb 10.1, plts 186.     Ruxolitinib decreased to 10mg  po BID on 02/03/2019.   On 02/21/2019: WBC 3.3, ANC 1.7, Hgb 7.2, Platelet 92.     Total time on ruxolitinib to date = 6 weeks.        Assessment/Plan:  #1 Primary myelofibrosis  #2 New anemia 2/2 ruxolitinib  #3 new thrombocytopenia 2/2 ruxolitinib    Hayley Wagner appears quite sensitive to ruxolitinib.  It is very much expected that the hemoglobin drops during the first 12 weeks of ruxoltinib treatment, so that is likely what we are seeing here.  There is presently no change to ruxolitinib indicated for the platelet count of 92 with the dose of 10mg  BID.  That said, the anemia may be a problem.  There are a few options, including:   (1) continuing with ruxolitinib 10mg  po BID and supporting the hemoglobin with pRBC transfusions to keep hemoglobin >7 g/dL assuming no symptoms at higher hemoglobin levels  (2) decreasing ruxolitinib to 5mg  po BID.      I favor the first option if the patient can tolerate.  An ESA can additionally be considered if needed.     Will have clinical team reach out to the patient with recommendations.     Will need repeat laboratory testing in 1 week at [facility].      Hayley Nunnery, MD  Hematology/Oncology

## 2019-02-22 NOTE — Unmapped (Signed)
Hi,     Milla contacted the [employer] requesting results of the following:     Procedure: Labs  Completed On: 02/21/2019      Please contact Georgette Shell Niu at [phone] for proper follow up.    Check Indicates criteria has been reviewed and confirmed with the patient:    [x]  Preferred Name   [x]  DOB and/or MR#  [x]  Preferred Contact Method  [x]  Phone Number(s)   []  MyChart     Thank you,   Jacques Navy  [facility]   [phone]

## 2019-02-22 NOTE — Unmapped (Signed)
Returned call to Dr. Chandra Batch to review results.  Hgb has dropped to 7.2.  See Dr. [name]' phone note.  Patient has elected to stay on Jakafi 10mg  PO BID and do labs/possible transfusions q2w.    Blood consent was obtained over the phone.  Will coordinate getting paper form in chart.    I have advised Hayley Wagner of his/her medical condition, and that the chances for his/her improvement or recovery will be significantly helped by receiving blood products by transfusion:  Such as packed red blood cells, fresh frozen plasma, platelets or cryoprecipitate (blood products).    I have explained the benefits that are expected from the patient being transfused and, as well, the risk.  The patient understands that although the blood products to be administered had been prepared and tested in accordance with strict scientific rules established by the American Association of Blood Banks, there is still a very small - approximately 1 in 70,000 for Hemolytic Reactions and approximately 1 in 190,000 for TRALI - chance of blood products could be incompatible with the patient's body and a transfusion reaction can occur.  Although transfusion reactions can be treated successfully, the patient understands that on rare occasions they can be fatal (1 in 250,000 transfusions).  The patient also understands that allergic reactions to blood products with hives, itching, and fever are more common but can be treated and may not even require the transfusion to be stopped.  The patient understands that even with testing by the most up-to-date methods, there is a small chance that the blood products may contain a virus that may not be recognized as an infection for many months or years.  Even with proper testing, I discussed with the patient that the chance for contracting viral Hepatitis B is approximately 1 in 200,000, Hepatitis C is approximately 1 in 2 million, and HIV is approximately 1 in 2 million.    I gave the patient an opportunity to ask questions regarding the transfusion of blood products and I answered those questions to the patient's satisfaction.

## 2019-02-27 DIAGNOSIS — D471 Chronic myeloproliferative disease: Principal | ICD-10-CM

## 2019-02-27 NOTE — Unmapped (Signed)
[facility] Clinical Assessment & Refill Coordination Note    Triston Lisanti, DOB: [birth year]  Phone: [phone] (home)     All above HIPAA information was verified with patient.     Was a Nurse, learning disability used for this call? No    Specialty Medication(s):   Hematology/Oncology: [id number]     Current Outpatient Medications   Medication Sig Dispense Refill   ??? ascorbate calcium, bulk, Powd Take by mouth.     ??? aspirin 81 MG chewable tablet Chew.     ??? betamethasone valerate (VALISONE) 0.1 % lotion Apply topically.     ??? diltiazem (CARDIZEM CD) 240 MG 24 hr capsule      ??? ERGOCALCIFEROL, VITAMIN D2, ORAL Take 1 capsule by mouth.     ??? famotidine (PEPCID) 20 MG tablet Take 1 tablet (20 mg total) by mouth Two (2) times a day. 60 tablet 11   ??? hydrocortisone 2.5 % cream Apply topically.     ??? ibuprofen (MOTRIN) 800 MG tablet      ??? losartan-hydrochlorothiazide (HYZAAR) 50-12.5 mg per tablet Take 1 tablet by mouth.     ??? metroNIDAZOLE (METROGEL) 1 % gel Small amount as needed twice a day     ??? multivitamin (TAB-A-VITE/THERAGRAN) per tablet Take 1 tablet by mouth.     ??? mupirocin (BACTROBAN) 2 % ointment Apply 1 application topically 2 (two) times daily. Behind R ear     ??? naproxen sodium (ALEVE) 220 MG tablet Take 220 mg by mouth.     ??? RESTASIS MULTIDOSE 0.05 % Drop      ??? ruxolitinib (JAKAFI) 5 mg tablet Take 2 tablets (10 mg total) by mouth Two (2) times a day. 120 tablet 0   ??? simvastatin (ZOCOR) 20 MG tablet TAKE 1 TABLET BY MOUTH ONCE DAILY       No current facility-administered medications for this visit.         Changes to medications: Donis reports no changes at this time.    No Known Allergies    Changes to allergies: No    SPECIALTY MEDICATION ADHERENCE     Jakafi 5 mg: 6 days of medicine on hand     Medication Adherence    Patient reported X missed doses in the last month: 0  Specialty Medication: Jakafi 5 mg tablets (2 tabs - 10mg - bid) Patient is on additional specialty medications: No  Informant: patient  Confirmed plan for next specialty medication refill: delivery by pharmacy  Refills needed for supportive medications: not needed          Specialty medication(s) dose(s) confirmed: Regimen is correct and unchanged.     Are there any concerns with adherence? No    Adherence counseling provided? Not needed    CLINICAL MANAGEMENT AND INTERVENTION      Clinical Benefit Assessment:    Do you feel the medicine is effective or helping your condition? Yes    Clinical Benefit counseling provided? Not needed    Adverse Effects Assessment:    Are you experiencing any side effects? Yes, patient reports experiencing anemia'. Side effect counseling provided: she is scheduled for blood transfusions every 2 weeks    Are you experiencing difficulty administering your medicine? No    Quality of Life Assessment:    How many days over the past month did your primary myelofibrosis  keep you from your normal activities? For example, brushing your teeth or getting up in the morning. 0  Have you discussed this with your provider? Not needed    Therapy Appropriateness:    Is therapy appropriate? Yes, therapy is appropriate and should be continued    DISEASE/MEDICATION-SPECIFIC INFORMATION      N/A    PATIENT SPECIFIC NEEDS     ? Does the patient have any physical, cognitive, or cultural barriers? No    ? Is the patient high risk? No     ? Does the patient require a Care Management Plan? No     ? Does the patient require physician intervention or other additional services (i.e. nutrition, smoking cessation, social work)? No      SHIPPING     Specialty Medication(s) to be Shipped:   Hematology/Oncology: Jakafi    Other medication(s) to be shipped: none     Changes to insurance: No    Delivery Scheduled: Yes, Expected medication delivery date: 03/02/19.  However, Rx request for refills was sent to the provider as there are none remaining. Medication will be delivered via Next Day Courier to the confirmed prescription address in [facility].    The patient will receive a drug information handout for each medication shipped and additional FDA Medication Guides as required.  Verified that patient has previously received a Conservation officer, historic buildings.    All of the patient's questions and concerns have been addressed.    Breck Coons Shared [facility] Specialty Pharmacist

## 2019-02-27 NOTE — Unmapped (Signed)
Left VM for patient about 1/5 appointment in [facility].    Burna Atlas

## 2019-02-28 ENCOUNTER — Ambulatory Visit: Admit: 2019-02-28 | Discharge: 2019-03-01 | Payer: MEDICARE

## 2019-02-28 DIAGNOSIS — D471 Chronic myeloproliferative disease: Principal | ICD-10-CM

## 2019-02-28 LAB — CBC W/ AUTO DIFF
BASOPHILS ABSOLUTE COUNT: 0 10*9/L (ref 0.0–0.1)
BASOPHILS RELATIVE PERCENT: 1.2 %
EOSINOPHILS ABSOLUTE COUNT: 0 10*9/L (ref 0.0–0.7)
EOSINOPHILS RELATIVE PERCENT: 0.8 %
HEMATOCRIT: 18.2 % — ABNORMAL LOW (ref 35.0–44.0)
HEMOGLOBIN: 6.2 g/dL — ABNORMAL LOW (ref 12.0–15.5)
LYMPHOCYTES ABSOLUTE COUNT: 0.8 10*9/L (ref 0.7–4.0)
LYMPHOCYTES RELATIVE PERCENT: 24.7 %
MEAN CORPUSCULAR HEMOGLOBIN CONC: 34 g/dL (ref 30.0–36.0)
MEAN CORPUSCULAR HEMOGLOBIN: 33.9 pg (ref 26.0–34.0)
MEAN CORPUSCULAR VOLUME: 99.8 fL — ABNORMAL HIGH (ref 82.0–98.0)
MEAN PLATELET VOLUME: 9.5 fL (ref 7.0–10.0)
MONOCYTES ABSOLUTE COUNT: 0.5 10*9/L (ref 0.1–1.0)
MONOCYTES RELATIVE PERCENT: 16.6 %
NEUTROPHILS RELATIVE PERCENT: 56.7 %
PLATELET COUNT: 116 10*9/L — ABNORMAL LOW (ref 150–450)
RED BLOOD CELL COUNT: 1.82 10*12/L — ABNORMAL LOW (ref 3.90–5.03)
RED CELL DISTRIBUTION WIDTH: 17.3 % — ABNORMAL HIGH (ref 12.0–15.0)
WBC ADJUSTED: 3.3 10*9/L — ABNORMAL LOW (ref 3.5–10.5)

## 2019-02-28 LAB — MEAN CORPUSCULAR HEMOGLOBIN: Erythrocyte mean corpuscular hemoglobin:EntMass:Pt:RBC:Qn:Automated count: 33.9

## 2019-02-28 MED ORDER — RUXOLITINIB 5 MG TABLET
ORAL_TABLET | Freq: Two times a day (BID) | ORAL | 0 refills | 30 days | Status: CP
Start: 2019-02-28 — End: 2019-03-30
  Filled 2019-03-01: qty 120, 30d supply, fill #0

## 2019-02-28 NOTE — Unmapped (Signed)
Infusion on 02/28/2019   Component Date Value Ref Range Status   ??? Blood Type 02/28/2019 O POS   Final   ??? Antibody Screen 02/28/2019 NEG   Final   ??? WBC 02/28/2019 3.3* 3.5 - 10.5 10*9/L Final   ??? RBC 02/28/2019 1.82* 3.90 - 5.03 10*12/L Final   ??? HGB 02/28/2019 6.2* 12.0 - 15.5 g/dL Final   ??? HCT 45/40/9811 18.2* 35.0 - 44.0 % Final   ??? MCV 02/28/2019 99.8* 82.0 - 98.0 fL Final   ??? MCH 02/28/2019 33.9  26.0 - 34.0 pg Final   ??? MCHC 02/28/2019 34.0  30.0 - 36.0 g/dL Final   ??? RDW 91/47/8295 17.3* 12.0 - 15.0 % Final   ??? MPV 02/28/2019 9.5  7.0 - 10.0 fL Final   ??? Platelet 02/28/2019 116* 150 - 450 10*9/L Final   ??? Neutrophils % 02/28/2019 56.7  % Final   ??? Lymphocytes % 02/28/2019 24.7  % Final   ??? Monocytes % 02/28/2019 16.6  % Final   ??? Eosinophils % 02/28/2019 0.8  % Final   ??? Basophils % 02/28/2019 1.2  % Final   ??? Absolute Neutrophils 02/28/2019 1.8  1.7 - 7.7 10*9/L Final   ??? Absolute Lymphocytes 02/28/2019 0.8  0.7 - 4.0 10*9/L Final   ??? Absolute Monocytes 02/28/2019 0.5  0.1 - 1.0 10*9/L Final   ??? Absolute Eosinophils 02/28/2019 0.0  0.0 - 0.7 10*9/L Final   ??? Absolute Basophils 02/28/2019 0.0  0.0 - 0.1 10*9/L Final   ??? Anisocytosis 02/28/2019 Slight* Not Present Final   ??? Blood Type 02/28/2019 O POS   Final   ??? Crossmatch 02/28/2019 Compatible   Final   ??? Unit Blood Type 02/28/2019 O Pos   Final   ??? ISBT Number 02/28/2019 5100   Final   ??? Unit # 02/28/2019 [id number]   Final   ??? Status 02/28/2019 Issued   Final   ??? Spec Expiration 02/28/2019 [id number]   Final   ??? Product ID 02/28/2019 Red Blood Cells   Final   ??? PRODUCT CODE 02/28/2019 [id number]   Final   ??? Crossmatch 02/28/2019 Compatible   Final   ??? Unit Blood Type 02/28/2019 O Pos   Final   ??? ISBT Number 02/28/2019 5100   Final   ??? Unit # 02/28/2019 [id number]   Final   ??? Status 02/28/2019 Issued   Final   ??? Spec Expiration 02/28/2019 [id number]   Final   ??? Product ID 02/28/2019 Red Blood Cells   Final ??? PRODUCT CODE 02/28/2019 [id number]   Final

## 2019-03-01 MED FILL — JAKAFI 5 MG TABLET: 30 days supply | Qty: 120 | Fill #0 | Status: AC

## 2019-03-01 NOTE — Unmapped (Signed)
VS taken per protocol. Labs drawn via newly place PIV.  Site  flushed and brisk blood return noted.    Pt tolerated well.  Results reviewed - Hgb 6.2..    Interventions - intervention needed.  Order placed for RBCs.  Premeds given.     Pt received 2 units of pRBCs - no adverse events noted.   AVS given.  Pt stable and assisted from clinic via wheelchair by family.  VWilliams,RN.

## 2019-03-14 NOTE — Unmapped (Signed)
Hi,     Patient  contacted the Communication Center regarding the following:    -Patient requesting to speak with Lurena Joiner regarding treatment plan.    Please contact patient at [phone].    Thanks in advance,    Christell Faith  [facility]   [phone]

## 2019-03-20 ENCOUNTER — Ambulatory Visit: Admit: 2019-03-20 | Discharge: 2019-03-20 | Payer: MEDICARE

## 2019-03-20 DIAGNOSIS — D471 Chronic myeloproliferative disease: Principal | ICD-10-CM

## 2019-03-20 LAB — CBC W/ AUTO DIFF
BASOPHILS ABSOLUTE COUNT: 0 10*9/L (ref 0.0–0.1)
BASOPHILS RELATIVE PERCENT: 1.1 %
EOSINOPHILS ABSOLUTE COUNT: 0.1 10*9/L (ref 0.0–0.7)
EOSINOPHILS RELATIVE PERCENT: 2.1 %
HEMATOCRIT: 31 % — ABNORMAL LOW (ref 35.0–44.0)
LYMPHOCYTES ABSOLUTE COUNT: 0.7 10*9/L (ref 0.7–4.0)
LYMPHOCYTES RELATIVE PERCENT: 18.9 %
MEAN CORPUSCULAR HEMOGLOBIN CONC: 33.7 g/dL (ref 30.0–36.0)
MEAN CORPUSCULAR HEMOGLOBIN: 30.5 pg (ref 26.0–34.0)
MEAN CORPUSCULAR VOLUME: 90.7 fL (ref 82.0–98.0)
MEAN PLATELET VOLUME: 9.1 fL (ref 7.0–10.0)
MONOCYTES ABSOLUTE COUNT: 0.5 10*9/L (ref 0.1–1.0)
MONOCYTES RELATIVE PERCENT: 14.1 %
NEUTROPHILS ABSOLUTE COUNT: 2.5 10*9/L (ref 1.7–7.7)
NEUTROPHILS RELATIVE PERCENT: 63.8 %
PLATELET COUNT: 155 10*9/L (ref 150–450)
RED BLOOD CELL COUNT: 3.42 10*12/L — ABNORMAL LOW (ref 3.90–5.03)
WBC ADJUSTED: 3.9 10*9/L (ref 3.5–10.5)

## 2019-03-20 LAB — ANISOCYTOSIS

## 2019-03-20 LAB — SMEAR REVIEW

## 2019-03-20 NOTE — Unmapped (Signed)
Infusion on 03/20/2019   Component Date Value Ref Range Status   ??? WBC 03/20/2019 3.9  3.5 - 10.5 10*9/L Final   ??? RBC 03/20/2019 3.42* 3.90 - 5.03 10*12/L Final   ??? HGB 03/20/2019 10.4* 12.0 - 15.5 g/dL Final   ??? HCT 16/11/9602 31.0* 35.0 - 44.0 % Final   ??? MCV 03/20/2019 90.7  82.0 - 98.0 fL Final   ??? MCH 03/20/2019 30.5  26.0 - 34.0 pg Final   ??? MCHC 03/20/2019 33.7  30.0 - 36.0 g/dL Final   ??? RDW 54/10/8117 21.5* 12.0 - 15.0 % Final   ??? MPV 03/20/2019 9.1  7.0 - 10.0 fL Final   ??? Platelet 03/20/2019 155  150 - 450 10*9/L Final   ??? Neutrophils % 03/20/2019 63.8  % Final   ??? Lymphocytes % 03/20/2019 18.9  % Final   ??? Monocytes % 03/20/2019 14.1  % Final   ??? Eosinophils % 03/20/2019 2.1  % Final   ??? Basophils % 03/20/2019 1.1  % Final   ??? Absolute Neutrophils 03/20/2019 2.5  1.7 - 7.7 10*9/L Final   ??? Absolute Lymphocytes 03/20/2019 0.7  0.7 - 4.0 10*9/L Final   ??? Absolute Monocytes 03/20/2019 0.5  0.1 - 1.0 10*9/L Final   ??? Absolute Eosinophils 03/20/2019 0.1  0.0 - 0.7 10*9/L Final   ??? Absolute Basophils 03/20/2019 0.0  0.0 - 0.1 10*9/L Final   ??? Anisocytosis 03/20/2019 Marked* Not Present Final   ??? Blood Type 03/20/2019 O POS   Final   ??? Antibody Screen 03/20/2019 NEG   Final   ??? Smear Review Comments 03/20/2019 See Comment* Undefined Final    Smear Reviewed.

## 2019-03-21 ENCOUNTER — Telehealth: Admit: 2019-03-21 | Discharge: 2019-03-22 | Payer: MEDICARE | Attending: Adult Health | Primary: Adult Health

## 2019-03-21 NOTE — Unmapped (Signed)
Myeloproliferative Neoplasms & Bone Marrow Failure Clinic Established Visit    Patient: Hayley Wagner  MRN: [id number]  DOB: [birth year]  Date of Visit: 03/21/2019    Referred By: Myrene Buddy, FNP  [address]  [facility] ??? PRIMARY CARE  [place],  [place] [address]       This patient visit was completed through the use of an audio/video or telephone encounter.    I spent 25 minutes on the real-time audio and video with the patient. I spent an additional 15 minutes on pre- and post-visit activities.     The patient was physically located in [place] or a state in which I am permitted to provide care. The patient understood that s/he may incur co-pays and cost sharing, and agreed to the telemedicine visit. The visit was completed via phone and/or video, which was appropriate and reasonable under the circumstances given the patient's presentation at the time.    The patient has been advised of the potential risks and limitations of this mode of treatment (including, but not limited to, the absence of in-person examination) and has agreed to be treated using telemedicine. The patient's/patient's family's questions regarding telemedicine have been answered.     If the phone/video visit was completed in an ambulatory setting, the patient has also been advised to contact their provider???s office for worsening conditions, and seek emergency medical treatment and/or call 911 if the patient deems either necessary.    Visit conducted by: Video  Person contacted: Hayley Wagner  Contact phone number: [phone]  Is there someone else in the room? yes  What is the relationship? friend  Do you want the person here for the visit?  yes         Reason for Visit   Hayley Wagner is a 86 y.o. 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    Assessment   #1 Primary myelofibrosis, JAK2 V617F-mutated  Dx:  05/19/2018  WHO 2016 criteria met: 1) bone marrow findings with MF-2, 2) JAK2 mutation, 3) doesn't meet criteria for ET, PV, CML, 4) LDH increased, 5) nucleated RBCs, 6) anemia not attributed to a comorbid condition  Driver Mutation: JAK2 [id number]  Prognostic Scores:  DIPSS: Int-2  DIPSS Plus: Int-2, median OS 2.9 years.   Not enough information for MIPSS 70 or GIPSS  Treatment: Ruxolitinib 10 mg PO BID    Doing fairly well overall.  Her labs have improved since decreasing her Jakafi.  Symptoms are stable.  She does continue to have chills which may be some sort of tremor?  There is no indication to think this is related to her disease or her ruxolitinib.  Since her blood counts are tolerating current dose, will consider increasing to 15mg  BID at next visit.    U/A to r/o UTI and referral to urogyn for prolapse.    Plan   1. Continue ruxolitinib 10mg  BID  2. Continue ASA 81mg  po daily for thrombosis prophylaxis    3. CBC-D and CMP in [facility] monthly for monitoring  4. Pepcid 20mg  PO BID for reflux  5. Consider Zoster vaccination with increased risk of reactivation  6. Continue to hold oral iron for now given GI side effects, will monitor need for IV infusion going forward   7. Urinalysis with culture reflex  8. Referral to urogyn  8. RTC in 6 weeks with video visit       Markus Jarvis, RN, MSN, AGPCNP-C  Nurse  Practitioner  Hematologic Malignancies  [facility]  [phone] (phone)  [phone] (fax)  [email or web address]      Interval History:  Doing fair today.  Biggest concern is not being able to get the vaccine.  Other than that, just generally doesn't feel well.  She has chills all over, generally doesn't feel cold, but sometimes her hands are cold.  Prior to rux initiation, but definitely since MF diagnosis.  Has had new urinary incontinence at night, says urine is murky looking.  She reports she doesn't feel significantly better on the lower dose - she did initially but not anymore.      Otherwise, denies new bone pain, fevers, chills, night sweats, lumps/bumps, tongue swelling, shortness of breath, syncope, lightheadedness, constipation or diarrhea, nausea or vomiting, very easy bruising or bleeding, or urinary changes.       History of Present Illness   86 y.o.  with CAD, thoracoabnodmal aneurysm, HTN, CKD stage 3, iron-defiency anemia, and JAK2+ MPN.    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  Still does have pain in both sides, but overall improving.  Had a temporal artery biopsy - was negative.  Had been started on steroids prior to the biopsy (but she goes nuts on steroids) but says that she felt no better while on prednisone.  She reports that her ophthalmologist said that she has corneal edema and there is swelling in the left. She is frustrated because can't really read anymore and can't drive.      Her biggest complaint, though is that in March 2020, she developed significant fatigue and weakness, citing that she slept for a couple of weeks.  Didn't even feel improved after IV iron infusion on 06/15/2018.  However, as of 4 weeks ago, she feels back to her baseline.  She does not know what has changed to elicit this response.  She did not have fevers.  She also started hydroxyurea in late April/early May, so possibly was related to this.  The last CBC was from 06/30/2018 and showed WBC 6.7, hgb 9.6, MCV 102, platelets 670.        Review of Systems   10 point ROS otherwise negative unless mentioned in history.     Medications     Current Outpatient Medications   Medication Sig Dispense Refill   ??? ascorbate calcium, bulk, Powd Take by mouth.     ??? aspirin 81 MG chewable tablet Chew.     ??? betamethasone valerate (VALISONE) 0.1 % lotion Apply topically.     ??? diltiazem (CARDIZEM CD) 240 MG 24 hr capsule      ??? ERGOCALCIFEROL, VITAMIN D2, ORAL Take 1 capsule by mouth.     ??? famotidine (PEPCID) 20 MG tablet Take 1 tablet (20 mg total) by mouth Two (2) times a day. 60 tablet 11   ??? hydrocortisone 2.5 % cream Apply topically.     ??? ibuprofen (MOTRIN) 800 MG tablet      ??? losartan-hydrochlorothiazide (HYZAAR) 50-12.5 mg per tablet Take 1 tablet by mouth.     ??? metroNIDAZOLE (METROGEL) 1 % gel Small amount as needed twice a day     ??? multivitamin (TAB-A-VITE/THERAGRAN) per tablet Take 1 tablet by mouth.     ??? mupirocin (BACTROBAN) 2 % ointment Apply 1 application topically 2 (two) times daily. Behind R ear     ??? naproxen sodium (ALEVE) 220 MG tablet Take  220 mg by mouth.     ??? RESTASIS MULTIDOSE 0.05 % Drop      ??? ruxolitinib (JAKAFI) 5 mg tablet Take 2 tablets (10 mg total) by mouth Two (2) times a day. 120 tablet 0   ??? simvastatin (ZOCOR) 20 MG tablet TAKE 1 TABLET BY MOUTH ONCE DAILY       No current facility-administered medications for this visit.        Allergies   No Known Allergies    Past Medical and Surgical History     Past Medical History:   Diagnosis Date   ??? CAD (coronary artery disease)    ??? Hypertension      No past surgical history on file.    Social History     Social History     Socioeconomic History   ??? Marital status: Widowed     Spouse name: Not on file   ??? Number of children: Not on file   ??? Years of education: Not on file   ??? Highest education level: Not on file   Occupational History   ??? Not on file   Social Needs   ??? Financial resource strain: Not on file   ??? Food insecurity     Worry: Not on file     Inability: Not on file   ??? Transportation needs     Medical: Not on file     Non-medical: Not on file   Tobacco Use   ??? Smoking status: Former Smoker   ??? Smokeless tobacco: Never Used   Substance and Sexual Activity   ??? Alcohol use: Yes   ??? Drug use: Not on file   ??? Sexual activity: Not on file   Lifestyle   ??? Physical activity     Days per week: Not on file     Minutes per session: Not on file   ??? Stress: Not on file   Relationships   ??? Social Wellsite geologist on phone: Not on file     Gets together: Not on file     Attends religious service: Not on file     Active member of club or organization: Not on file     Attends meetings of clubs or organizations: Not on file     Relationship status: Not on file   Other Topics Concern   ??? Not on file   Social History Narrative   ??? Not on file   Lives independently with two cats.  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      Family History   Sister with Alzheimer disease  No known blood cancers    Physical Examination     There were no vitals filed for this visit.  LIMITED EXAM DUE TO ATTEMPT TO LIMIT CONTACT DUE TO CORONAVIRUS IN THE COMMUNITY    GENERAL:  The patient appears well and is in no acute distress  HEENT: No scleral icterus.  RESP: Breathing comfortably and unlabored speech.    NEURO: A&Ox4  PSYCH: Normal affect      Laboratory Testing and Imaging     Results for orders placed or performed in visit on 03/20/19   Type and Screen   Result Value Ref Range    Blood Type O POS     Antibody Screen NEG    CBC w/ Differential  Result Value Ref Range    WBC 3.9 3.5 - 10.5 10*9/L    RBC 3.42 (L) 3.90 - 5.03 10*12/L    HGB 10.4 (L) 12.0 - 15.5 g/dL    HCT 16.1 (L) 09.6 - 44.0 %    MCV 90.7 82.0 - 98.0 fL    MCH 30.5 26.0 - 34.0 pg    MCHC 33.7 30.0 - 36.0 g/dL    RDW 04.5 (H) 40.9 - 15.0 %    MPV 9.1 7.0 - 10.0 fL    Platelet 155 150 - 450 10*9/L    Neutrophils % 63.8 %    Lymphocytes % 18.9 %    Monocytes % 14.1 %    Eosinophils % 2.1 %    Basophils % 1.1 %    Absolute Neutrophils 2.5 1.7 - 7.7 10*9/L    Absolute Lymphocytes 0.7 0.7 - 4.0 10*9/L    Absolute Monocytes 0.5 0.1 - 1.0 10*9/L    Absolute Eosinophils 0.1 0.0 - 0.7 10*9/L    Absolute Basophils 0.0 0.0 - 0.1 10*9/L    Anisocytosis Marked (A) Not Present   Morphology Review   Result Value Ref Range    Smear Review Comments See Comment (A) Undefined

## 2019-03-21 NOTE — Unmapped (Signed)
Pt in for labs and possible transfusion.  Labs drawn from 24 gauge PIV inserted by this Clinical research associate.  Pt tolerated with no problems though had couple of sticks.    Hgb 10.4 and hematocrit 31.0.  Labs wnl requiring no interventions at this time.  AVS given and f/u appt sent to pool.    Pt left unit ambulatory accompanied by female family member.  No concerns voiced.  VWilliams,RN.

## 2019-03-21 NOTE — Unmapped (Signed)
LM on primary # for pt to call back re: virtual rooming. Doximity scheduled for today 1200.

## 2019-03-27 DIAGNOSIS — D471 Chronic myeloproliferative disease: Principal | ICD-10-CM

## 2019-03-27 NOTE — Unmapped (Signed)
Refill request from pharmacy. Routed to Pharmacist.

## 2019-03-27 NOTE — Unmapped (Signed)
[facility] Refill Coordination Note    Specialty Medication(s) to be Shipped:   Hematology/Oncology: [name]    Other medication(s) to be shipped: n/a     Hayley Wagner, DOB: 09/17/1928  Phone: [phone] (home)       All above HIPAA information was verified with patient.     Was a Nurse, learning disability used for this call? No    Completed refill call assessment today to schedule patient's medication shipment from the [facility] [phone]).       Specialty medication(s) and dose(s) confirmed: Regimen is correct and unchanged.   Changes to medications: Altha reports no changes at this time.  Changes to insurance: No  Questions for the pharmacist: No    Confirmed patient received Welcome Packet with first shipment. The patient will receive a drug information handout for each medication shipped and additional FDA Medication Guides as required.       DISEASE/MEDICATION-SPECIFIC INFORMATION        N/A    SPECIALTY MEDICATION ADHERENCE     Medication Adherence    Patient reported X missed doses in the last month: 0  Specialty Medication: Jakafi 5 mg  Patient is on additional specialty medications: No  Any gaps in refill history greater than 2 weeks in the last 3 months: no  Demonstrates understanding of importance of adherence: yes  Informant: patient  Reliability of informant: reliable  Confirmed plan for next specialty medication refill: delivery by pharmacy  Refills needed for supportive medications: not needed                Jakafi 5 mg. 7 days on hand      SHIPPING     Shipping address confirmed in Epic.     Delivery Scheduled: Yes, Expected medication delivery date: 03/30/2019.  However, Rx request for refills was sent to the provider as there are none remaining.     Medication will be delivered via Next Day Courier to the prescription address in Epic WAM.    Griselda Tosh D Ricki Clack   [facility] Specialty Technician

## 2019-03-28 DIAGNOSIS — D471 Chronic myeloproliferative disease: Principal | ICD-10-CM

## 2019-03-28 MED ORDER — RUXOLITINIB 5 MG TABLET
ORAL_TABLET | Freq: Two times a day (BID) | ORAL | 3 refills | 30 days | Status: CP
Start: 2019-03-28 — End: 2019-04-27
  Filled 2019-03-29: qty 120, 30d supply, fill #0

## 2019-03-29 MED FILL — JAKAFI 5 MG TABLET: 30 days supply | Qty: 120 | Fill #0 | Status: AC

## 2019-03-31 ENCOUNTER — Ambulatory Visit
Admit: 2019-03-31 | Discharge: 2019-04-07 | Disposition: A | Discharge status: Nursing Home (Includes ICF) | Payer: MEDICARE | Admitting: Geriatric Medicine

## 2019-03-31 LAB — COMPREHENSIVE METABOLIC PANEL
ALBUMIN: 4.4 g/dL (ref 3.5–5.0)
ALKALINE PHOSPHATASE: 95 U/L (ref 38–126)
ALT (SGPT): 22 U/L (ref <35)
ANION GAP: 10 mmol/L (ref 7–15)
AST (SGOT): 41 U/L — ABNORMAL HIGH (ref 14–38)
BILIRUBIN TOTAL: 0.7 mg/dL (ref 0.0–1.2)
BLOOD UREA NITROGEN: 29 mg/dL — ABNORMAL HIGH (ref 7–21)
BUN / CREAT RATIO: 23
CALCIUM: 9.2 mg/dL (ref 8.5–10.2)
CHLORIDE: 106 mmol/L (ref 98–107)
CO2: 26 mmol/L (ref 22.0–30.0)
CREATININE: 1.25 mg/dL — ABNORMAL HIGH (ref 0.60–1.00)
EGFR CKD-EPI NON-AA FEMALE: 38 mL/min/{1.73_m2} — ABNORMAL LOW (ref >=60)
GLUCOSE RANDOM: 142 mg/dL (ref 70–179)
POTASSIUM: 3.9 mmol/L (ref 3.5–5.0)
PROTEIN TOTAL: 7.3 g/dL (ref 6.5–8.3)
SODIUM: 142 mmol/L (ref 135–145)

## 2019-03-31 LAB — URINALYSIS WITH CULTURE REFLEX
BILIRUBIN UA: NEGATIVE
BLOOD UA: NEGATIVE
GLUCOSE UA: NEGATIVE
NITRITE UA: NEGATIVE
PROTEIN UA: NEGATIVE
RBC UA: 2 /HPF (ref <4)
SPECIFIC GRAVITY UA: 1.015 (ref 1.005–1.040)
SQUAMOUS EPITHELIAL: 12 /HPF — ABNORMAL HIGH (ref 0–5)
TRANSITIONAL EPITHELIAL: 32 /HPF — ABNORMAL HIGH (ref 0–2)
UROBILINOGEN UA: 0.2
WBC UA: >100 /HPF — ABNORMAL HIGH (ref 0–5)

## 2019-03-31 LAB — CBC W/ AUTO DIFF
BASOPHILS ABSOLUTE COUNT: 0 10*9/L (ref 0.0–0.1)
BASOPHILS RELATIVE PERCENT: 0.6 %
EOSINOPHILS ABSOLUTE COUNT: 0.1 10*9/L (ref 0.0–0.7)
EOSINOPHILS RELATIVE PERCENT: 1.2 %
HEMATOCRIT: 24.8 % — ABNORMAL LOW (ref 35.0–44.0)
HEMOGLOBIN: 8.5 g/dL — ABNORMAL LOW (ref 12.0–15.5)
LYMPHOCYTES ABSOLUTE COUNT: 0.5 10*9/L — ABNORMAL LOW (ref 0.7–4.0)
LYMPHOCYTES RELATIVE PERCENT: 9.3 %
MEAN CORPUSCULAR HEMOGLOBIN CONC: 34.3 g/dL (ref 30.0–36.0)
MEAN CORPUSCULAR HEMOGLOBIN: 30.9 pg (ref 26.0–34.0)
MEAN CORPUSCULAR VOLUME: 90 fL (ref 82.0–98.0)
MONOCYTES ABSOLUTE COUNT: 0.6 10*9/L (ref 0.1–1.0)
MONOCYTES RELATIVE PERCENT: 10.3 %
NEUTROPHILS ABSOLUTE COUNT: 4.4 10*9/L (ref 1.7–7.7)
NEUTROPHILS RELATIVE PERCENT: 78.6 %
PLATELET COUNT: 181 10*9/L (ref 150–450)
RED BLOOD CELL COUNT: 2.76 10*12/L — ABNORMAL LOW (ref 3.90–5.03)
RED CELL DISTRIBUTION WIDTH: 22.3 % — ABNORMAL HIGH (ref 12.0–15.0)

## 2019-03-31 LAB — LACTATE BLOOD VENOUS: Lactate:SCnc:Pt:BldV:Qn:: 1.2

## 2019-03-31 LAB — LIPASE: Triacylglycerol lipase:CCnc:Pt:Ser/Plas:Qn:: 63

## 2019-03-31 LAB — SMEAR REVIEW

## 2019-03-31 LAB — MEAN CORPUSCULAR HEMOGLOBIN CONC: Erythrocyte mean corpuscular hemoglobin concentration:MCnc:Pt:RBC:Qn:Automated count: 34.3

## 2019-03-31 LAB — TROPONIN I: Troponin I.cardiac:MCnc:Pt:Ser/Plas:Qn:: <0.06

## 2019-03-31 LAB — CALCIUM: Calcium:MCnc:Pt:Ser/Plas:Qn:: 9.2

## 2019-03-31 LAB — UROBILINOGEN UA: Lab: 0.2

## 2019-03-31 NOTE — Unmapped (Signed)
AOC Triage Note     Patient: Hayley Wagner     Reason for call:  return call    Time call returned: 1341     Phone Assessment: pt's son, Hayley Wagner calling from [place] to report that his brother stopped by the pt's home and she answered the door and did not know who he was.  She is also somewhat delusional thinking they are on top of a mountain and is saying things that don't make sense, but then she will turn around and say - Wait, that's not right.  They are very concerned.    Hayley Wagner advised that pt was not like this yesterday when she went to receive her first covid injection, so they think it is a recent event.       Triage Recommendations:      RN advised to take the pt to the ED to be worked up as this could be signs of a CVA.  The son endorses that the pt has had multiple TIAs in the past.  He will call his brother and ask him to take her straight to the ED for workup.

## 2019-03-31 NOTE — Unmapped (Signed)
Hi,     Christiane Ha son of the patient has contacted the Communication Center in regards to the following symptom:     Confusion  -Says the patient may blurt out words that make no sense. The patient is aware that it is happening but says she is not sure why it is happening.     Please contact Cletis Athens at [phone]    Check Indicates criteria has been reviewed and confirmed with the patient:    [x]  Preferred Name   [x]  DOB and/or MR#  [x]  Preferred Contact Method  [x]  Phone Number(s)   []  MyChart     A page or telephone call has been made to the corresponding clinic.     Thank you,  Drema Balzarine   [facility]   [phone]

## 2019-04-01 LAB — CBC W/ AUTO DIFF
BASOPHILS ABSOLUTE COUNT: 0 10*9/L (ref 0.0–0.1)
BASOPHILS RELATIVE PERCENT: 0.9 %
EOSINOPHILS ABSOLUTE COUNT: 0.1 10*9/L (ref 0.0–0.7)
EOSINOPHILS RELATIVE PERCENT: 1.6 %
HEMATOCRIT: 21.2 % — ABNORMAL LOW (ref 35.0–44.0)
HEMOGLOBIN: 7.2 g/dL — ABNORMAL LOW (ref 12.0–15.5)
LYMPHOCYTES ABSOLUTE COUNT: 0.4 10*9/L — ABNORMAL LOW (ref 0.7–4.0)
LYMPHOCYTES RELATIVE PERCENT: 9.2 %
MEAN CORPUSCULAR HEMOGLOBIN CONC: 34 g/dL (ref 30.0–36.0)
MEAN CORPUSCULAR HEMOGLOBIN: 30.9 pg (ref 26.0–34.0)
MEAN CORPUSCULAR VOLUME: 90.8 fL (ref 82.0–98.0)
MEAN PLATELET VOLUME: 8.8 fL (ref 7.0–10.0)
MONOCYTES ABSOLUTE COUNT: 0.8 10*9/L (ref 0.1–1.0)
MONOCYTES RELATIVE PERCENT: 16 %
NEUTROPHILS ABSOLUTE COUNT: 3.5 10*9/L (ref 1.7–7.7)
NEUTROPHILS RELATIVE PERCENT: 72.3 %
RED BLOOD CELL COUNT: 2.33 10*12/L — ABNORMAL LOW (ref 3.90–5.03)
RED CELL DISTRIBUTION WIDTH: 22.2 % — ABNORMAL HIGH (ref 12.0–15.0)
WBC ADJUSTED: 4.8 10*9/L (ref 3.5–10.5)

## 2019-04-01 LAB — COMPREHENSIVE METABOLIC PANEL
ALBUMIN: 3.4 g/dL — ABNORMAL LOW (ref 3.5–5.0)
ALKALINE PHOSPHATASE: 83 U/L (ref 38–126)
ALT (SGPT): 18 U/L (ref <35)
ANION GAP: 6 mmol/L — ABNORMAL LOW (ref 7–15)
AST (SGOT): 36 U/L (ref 14–38)
BILIRUBIN TOTAL: 0.5 mg/dL (ref 0.0–1.2)
BLOOD UREA NITROGEN: 23 mg/dL — ABNORMAL HIGH (ref 7–21)
BUN / CREAT RATIO: 25
CALCIUM: 8.2 mg/dL — ABNORMAL LOW (ref 8.5–10.2)
CO2: 24 mmol/L (ref 22.0–30.0)
CREATININE: 0.93 mg/dL (ref 0.60–1.00)
EGFR CKD-EPI AA FEMALE: 63 mL/min/{1.73_m2} (ref >=60)
EGFR CKD-EPI NON-AA FEMALE: 55 mL/min/{1.73_m2} — ABNORMAL LOW (ref >=60)
GLUCOSE RANDOM: 131 mg/dL (ref 70–179)
POTASSIUM: 3.9 mmol/L (ref 3.5–5.0)
SODIUM: 139 mmol/L (ref 135–145)

## 2019-04-01 LAB — LYMPHOCYTES RELATIVE PERCENT: Lymphocytes/100 leukocytes:NFr:Pt:Bld:Qn:Automated count: 9.2

## 2019-04-01 LAB — IRON PANEL
IRON SATURATION (CALC): 44 % (ref 15–50)
TRANSFERRIN: 293.5 mg/dL (ref 200.0–380.0)

## 2019-04-01 LAB — THYROID STIMULATING HORMONE: Thyrotropin:ACnc:Pt:Ser/Plas:Qn:: 3.534 — ABNORMAL HIGH

## 2019-04-01 LAB — FREE T4: Thyroxine.free:MCnc:Pt:Ser/Plas:Qn:: 1.35

## 2019-04-01 LAB — HEMOGLOBIN: Hemoglobin:MCnc:Pt:Bld:Qn:: 7.1 — ABNORMAL LOW

## 2019-04-01 LAB — BILIRUBIN TOTAL: Bilirubin:MCnc:Pt:Ser/Plas:Qn:: 0.5

## 2019-04-01 LAB — FERRITIN: Ferritin:MCnc:Pt:Ser/Plas:Qn:: 1090 — ABNORMAL HIGH

## 2019-04-01 LAB — IRON: Iron:MCnc:Pt:Ser/Plas:Qn:: 164

## 2019-04-01 LAB — CREATINE KINASE TOTAL: Creatine kinase:CCnc:Pt:Ser/Plas:Qn:: 137

## 2019-04-01 NOTE — Unmapped (Signed)
OCCUPATIONAL THERAPY  Evaluation (04/01/19 1319)    Patient Name:  Hayley Wagner       Medical Record Number: [id number]   Date of Birth: 1928-09-29  Sex: Female          OT Treatment Diagnosis:  Decreased (I) with ADLs and functional mobility    Assessment    Problem List: Decreased endurance, Impaired balance, Decreased mobility, Fall Risk, Impaired ADLs, Pain  Clinical Decision Making: Low    Assessment:     Laquesha Holcomb is a 86 y.o. 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  Patient presents to OT with below baseline functional activities impacting pt's independence with ADLs and functional mobility/transfers compared to baseline. Patient educated on purpose and goal of OT.  Patient completing sit to stand with sba, cga with functional mobility with use of cane initially, then transitioned to RW due to unsteadiness, functional t/f to commode with sba, performed hand washing task standing at sink level with sba this date. Pt exhibiting SOB and easy fatigue an decreased safety awareness with functional mobility. Pt Ex-daughter-in-law in room, discussed [employer] for possible purchase at home due to pt being incontinent at night, educated pt with use of RW for ambulation and also use of pull ups as pt stacks 4-5 pads in panties at night. Educated pt and family member increased risk of UTI with multiple pads in panties, parties verbalized understanding and will try pull ups. After review of the patient's occupational profile and history, assessment of occupational performance, clinical decision making, and development of POC, the patient presents as a low complexity case. Based on the daily activity AM-PAC raw score of 19/24, the patient is considered to be 42.8% impaired with self care. Recommend post acute 3x. DME: three in one commode Today's Interventions: ADL retraining, Balance activities, Bed mobility, Compensatory tech. training, Conservation, Education - Patient, Functional mobility, Endurance activities, Education - Family / caregiver, Teacher, early years/pre, Insurance account manager / Proximal stability, Safety education    Today's Interventions: Role of OT and POC, standing balance/tolerance, activity tolerance, energy conservation, safety awareness, pt/family education, functional mobilty/transfers, AM-PAC    Activity Tolerance During Today's Session  Patient tolerated treatment well    Plan  Planned Frequency of Treatment:  1-2x per day for: 3-4x week       Planned Interventions:  Adaptive equipment, Balance activities, ADL retraining, Bed mobility, Compensatory tech. training, Conservation, Education - Patient, Home exercise program, Functional mobility, Endurance activities, Education - Family / caregiver, Neuromuscular re-education, Insurance account manager / Proximal stability, Positioning, Range of motion, Safety education, Visual / perceptual tasks, UE Strength / coordination exercise, Transfer training, Therapeutic exercise    Post-Discharge Occupational Therapy Recommendations:  OT Post Acute Discharge Recommendations: 3x weekly   OT DME Recommendations: Three in one commode    GOALS:   Patient and Family Goals: to get stronger    Long Term Goal #1: Performed self care routine with mod (I) within 6 weeks       Short Term:  Performed functional t/f to commode witih mod (I) with LRAD with good safety awareness   Time Frame : 5 days  Perform full body dressing with supervision   Time Frame : 5 days  Perform mi-manual ADL task standing ~3-4 minutes with good standing balance   Time Frame : 5 days     Prognosis:  Good  Positive  Indicators:  supportive family, motivated, good PLOF  Barriers to Discharge: Endurance deficits, Cognitive deficits, Pain, Impaired Balance, Gait instability, Inability to safely perform ADLS, Decreased safety awareness    Subjective  Current Status Pt received/left semi reclined in bed, RN aware, all needs met, call bell within reach.  Prior Functional Status Pt reports (I) with ADLs, cooking, cleaning, no driving, family and friends provide support, pt was working as a Production manager prior to [employer], endorses 2 falls in the last 6 months.    Medical Tests / Procedures: Reviewed in EPIC  Services patient receives: PT, OT    Patient / Caregiver reports: pt and RN agreeable    Past Medical History:   Diagnosis Date   ??? CAD (coronary artery disease)    ??? Cancer (CMS-HCC)    ??? Hypertension     Social History     Tobacco Use   ??? Smoking status: Former Smoker   ??? Smokeless tobacco: Never Used   Substance Use Topics   ??? Alcohol use: Yes      No past surgical history on file. History reviewed. No pertinent family history.     Patient has no known allergies.     Objective Findings  Precautions / Restrictions  Falls precautions    Weight Bearing  Non-applicable    Required Braces or Orthoses  Non-applicable    Communication Preference  Verbal    Pain  pt with c/o pain at right temporal region, RN aware    Equipment / Environment  Vascular access (PIV, TLC, Port-a-cath, PICC), Caregiver wearing mask for full session, Patient not wearing mask for full session    Living Situation  Living Environment: House  Lives With: Alone  Home Living: One level home, Tub/shower unit, Walk-in shower, Stairs to alternate level without rails, Hand-held shower hose  Number of Stairs: 2(Also has sunken sun room with 1STE with no railings)     Cognition   Orientation Level:  Oriented x 4   Arousal/Alertness:  Appropriate responses to stimuli   Attention Span:  Appears intact   Memory:  Appears intact   Following Commands:  Follows all commands and directions without difficulty   Safety Judgment:  Good awareness of safety precautions   Awareness of Errors:  Good awareness of safety precautions   Problem Solving:  Able to problem solve independently   Comments: n/a    Vision / Perception    Hearing: WFL   Vision: Wears glasses all the time  Perception: grossly intact  Comments: n/a    Hand Function  Hand Dominance: right  B grip WFL    Skin Inspection  visible skin intact    ROM / Strength/Coordination  UE ROM/ Strength/ Coordination: B UE AROM and MMT WFL  LE ROM/ Strength/ Coordination: see PT consult for results    Sensation:  SILT, no c/o numbness/tingling    Balance:  sitting balance good, standing balance is fair- standing at RW level    Functional Mobility  Transfer Assistance Needed: Yes  Transfers - Needs Assistance: (cga due to unsteadiness)  Bed Mobility Assistance Needed: No      ADLs  ADLs: Needs assistance with ADLs  ADLs - Needs Assistance: Grooming, Toileting, LB dressing  Grooming - Needs Assistance: (pt performed hand washing task with sba standing at sink level)  Toileting - Needs Assistance: (cga due to unsteadiness)  LB Dressing - Needs Assistance: (pt able to don slippers with set up)  IADLs: NT  Vitals / Orthostatics  At Rest: NAD  With Activity: NAD  Orthostatics: asymptomatic      Medical Staff Made Aware: Vonnie RN      Occupational Therapy Session Duration  OT Individual - Duration: 55         I attest that I have reviewed the above information.  Signed: Ricky Stabs, OT  Filed 04/01/2019

## 2019-04-01 NOTE — Unmapped (Signed)
Patient alert and oriented x2 to person and situation. Patient was a new admission on my shift. Patient was originally accompanied by ex-daughter in- law. Med's locked up in pharmacy [facility]. Patient takes pills whole with water. Patient is continent of bladder at this time. Walks to the bathroom with standby assistance and her cane.  No falls this shift. No s/s distress will continue to monitor and follow poc.

## 2019-04-01 NOTE — Unmapped (Signed)
Care Management  Initial Transition Planning Assessment              CM met with patient in pt room.  Pt/visitors were wearing hospital provided masks for the duration of the interaction with CM.   CM was wearing hospital provided surgical mask and hospital provided eye protection.  CM was not within 6 foot of the patient/visitors during this interaction.     SWCM met with pt and ex dtr-in-law Tiana who explained that she used to be married to pt's son Dickie La, but they remain close and involved in the lives of their children and the pt Minus Liberty). Pt was mostly independent prior to admission, but family expresses concern about pt's confusion and wants to ensure that she is safe after discharge. The pt has hired assistance from a woman/friend Clydie Braun who helps with housekeeping, transportation and more as needed. Neighbors are also available to offer assistance and support. Family feels that if pt returns home, she would benefit from [facility] RN/PT/OT. Pt has been getting blood transfusions regularly.    General  Care Manager assessed the patient by : In person interview with patient, In person interview with family, Medical record review  Orientation Level: Oriented X4  Functional level prior to admission: Partially Assisted  Who provides care at home?: Paid caregiver, Friend, Family member  Level of assistance required: Other(Housekeeping, errands/transportation)  Reason for referral: Discharge Planning, Home Health    Contact/Decision Maker  Extended Emergency Contact Information  Primary Emergency Contact: Consepcion, Utt  Mobile Phone: [phone]  Relation: Son  Secondary Emergency Contact: Remer Macho  Mobile Phone: [phone]  Relation: Daughter in [place] (ex)    Legal Next of Kin / Guardian / POA / Advance Directives   Advance Directive (Medical Treatment)  Does patient have an advance directive covering medical treatment?: Patient does not have advance directive covering medical treatment.  Advance directive covering medical treatment not in Chart:: Copy requested from family  Reason patient does not have an advance directive covering medical treatment:: Patient does not wish to complete one at this time.    Health Care Decision Maker [HCDM] (Medical & Mental Health Treatment)  Healthcare Decision Maker: Patient does not wish to appoint a Health Care Decision Maker at this time  Information offered on HCDM, Medical & Mental Health advance directives:: Patient given information.    Patient Information  Lives with: Alone (with 2 cats)  Type of Residence: Private residence  Location/Detail: [address], [place], [place] [address].  [facility].  Patient Phone Number: [phone] (home)         Medical Provider(s): Caryl Asp, FNP  Reason for Admission: Admitting Diagnosis:  No admission diagnoses are documented for this encounter.  Past Medical History:   has a past medical history of CAD (coronary artery disease), Cancer (CMS-HCC), and Hypertension.  Past Surgical History:   has no past surgical history on file.   Previous admit date: N/A    Primary Insurance- Payor: MEDICARE / Plan: MEDICARE PART A AND PART B / Product Type: *No Product type* /   Secondary Insurance ??? Secondary Insurance  AARP  Prescription Coverage ???  Medicare D/AARP  Preferred Pharmacy - [id number] - [place], [place] - [address] AT [facility] [address]  [facility]    Transportation home: Private vehicle    Support Systems/Concerns: Church/Faith Community, Family Members, Friends/Neighbors, Children    Responsibilities/Dependents at home?:  Yes (Describe)(cares for 2 cats)    Home Care services in place prior to admission?: No    Outpatient/Community Resources in place prior to admission: Clinic  Agency detail (Name/Phone #): PCP: Hermenia Fiscal Gauger, [phone].  [facility]    Equipment Currently Used at Home: cane, straight    Currently receiving outpatient dialysis?: No Financial Information  Pt receives retirement income.  Need for financial assistance?: No    Social Determinants of Health  Social History     Socioeconomic History   ??? Marital status: Widowed     Spouse name: None   ??? Number of children: None   ??? Years of education: None   ??? Highest education level: None   Occupational History   ??? None   Social Needs   ??? Financial resource strain: Not very hard   ??? Food insecurity     Worry: Never true     Inability: Never true   ??? Transportation needs     Medical: No     Non-medical: No   Tobacco Use   ??? Smoking status: Former Smoker   ??? Smokeless tobacco: Never Used   Substance and Sexual Activity   ??? Alcohol use: Yes   ??? Drug use: None   ??? Sexual activity: None   Lifestyle   ??? Physical activity     Days per week: None     Minutes per session: None   ??? Stress: None   Relationships   ??? Social Wellsite geologist on phone: None     Gets together: None     Attends religious service: None     Active member of club or organization: None     Attends meetings of clubs or organizations: None     Relationship status: Widowed   Other Topics Concern   ??? None   Social History Narrative   ??? None     Housing/Utilities   ??? Within the past 12 months, have you ever stayed: outside, in a car, in a tent, in an overnight shelter, or temporarily in someone else's home (i.e. couch-surfing)? No    ??? Are you worried about losing your housing? No    ??? Within the past 12 months, have you been unable to get utilities (heat, electricity) when it was really needed? No      Literacy   ??? How often do you need to have someone help you when you read instructions, pamphlets, or other written material from your doctor or pharmacy? Sometimes        Discharge Needs Assessment  Concerns to be Addressed: discharge planning, home safety, cognitive/perceptual    Clinical Risk Factors: > 65, Principal Diagnosis: Cancer, Stroke, COPD, Heart Failure, AMI, Pneumonia, Joint Replacment, Multiple Diagnoses (Chronic), History of Falls, Lives Alone or Absence of Caregiver to Assist with Discharge and Home Care    Barriers to taking medications: Other (Comment)(possibly - TIAs and cerebral atrophy shown on imaging.)    Prior overnight hospital stay or ED visit in last 90 days: No    Readmission Within the Last 30 Days: no previous admission in last 30 days    Anticipated Changes Related to Illness: other (see comments)(impaired ability to care for self.)    Equipment Needed After Discharge: other (see comments)(TBD)    Discharge Facility/Level of Care Needs: other (see comments)(Home with [facility])    Readmission  Risk of Unplanned Readmission Score:  %  Predictive Model Details   No  score data available for [facility] Risk of Unplanned Readmission     Readmitted Within the Last 30 Days? (No if blank)   Patient at risk for readmission?: No    Discharge Plan  Screen findings are: Discharge planning needs identified or anticipated (Comment).(Home with [facility])    Expected Discharge Date:     Expected Transfer from Critical Care: (N/A)    Quality data for continuing care services shared with patient and/or representative?: (not yet - needs still being assessed)  Patient and/or family were provided with choice of facilities / services that are available and appropriate to meet post hospital care needs?: Other (Comment)(not yet - needs still being assessed. Only HH was discussed as a possibility based on family's anticipations.)    Initial Assessment complete?: Yes      Leafy Ro, LCSW, Inpatient Case Manager, April 01, 2019 1:56 PM

## 2019-04-01 NOTE — Unmapped (Signed)
[facility]  Emergency Department Provider Note     ED Clinical Impression      Final diagnoses:   Acute cystitis without hematuria (Primary)   Acute kidney injury (CMS-HCC)       Impression, ED Course, Assessment and Plan      Impression: Hayley Wagner Patient is a 86 y.o. 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  She was noted by family to be confused.  The patient states that she was aware of her confusion and her only other symptoms today were feeling cold and generally weak.  On further questioning, she endorses similar symptoms intermittently for several weeks and it is difficult to pin down a clear onset, however her episode today does appear to be significantly more severe than prior.  She denied any unilateral weakness, headache, vision changes, difficulty with balance, or other evidence of stroke.  Clinically here today, she has no unilateral weakness, cranial nerve deficits, or abnormal sensation to suggest acute stroke.  Differential diagnosis also includes most likely occult infection such as urinary tract infection or pneumonia.  Plan is to also test for COVID-19/flu/RSV.  Other etiologies include a metabolic etiology such as significant electrolyte disturbance, though given the transient nature this is felt less likely.  Plan is screening labs for infection, metabolic panel, chest x-ray, and reevaluation.    7:50 PM  Labs returned with a mild AKI, creatinine up to 1.25 from 0.9 on 12/29.  Hemoglobin is also slightly decreased from then.  Urinalysis does show large leukocyte esterase with greater than 100 white blood cells and many bacteria consistent with urinary tract infection.  The remainder of her labs are reassuring.  Plan is for fluid bolus regarding her mild AKI and antibiotics.  Plan is also to discuss with oncology fellow.    8:24 PM   Case was discussed with the hematology fellow who reports from a myelofibrosis standpoint, there is no particular concern from the labs or reason for admission.  On reevaluation, the patient states she continues to feel generally weak and I discussed admission versus discharge with close outpatient follow-up.  The family is concerned about the patient's ability to climb stairs at home and the patient states I could crawl up them.  The patient was also briefly confused during our discussion and given her age, acute kidney injury, and ongoing intermittent confusion, the family would be more comfortable with admission at least overnight for continued hydration, antibiotics, and reevaluation in the morning.  MAO has been paged at this time.     Additional Medical Decision Making     I have reviewed the vital signs and the nursing notes. Labs and radiology results that were available during my care of the patient were independently reviewed by me and considered in my medical decision making. I directly visualized and independently interpreted the EKG tracing. I independently visualized the radiology images. I reviewed the patient's prior medical records. I obtained additional history from daughter-in-law. I discussed the case with the oncology consultant.     Portions of this record have been created using Scientist, clinical (histocompatibility and immunogenetics). Dictation errors have been sought, but may not have been identified and corrected.  ____________________________________________         History      Chief Complaint  Altered Mental Status  HPI   Hayley Wagner is a 86 y.o. 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 Patient reports that she woke up feeling confused and was aware of her confusion, but didn't know how to correct it.  She also endorses feeling generally weak during the day today and states that she has been feeling cold during the day today as well.  She endorses discomfort in her right lower maxilla around her dentures but denies any fevers or chills.  She states that she has been experiencing profound fatigue for the past six months. Patient does report previous episodes of confusion for the past four weeks, but not to this extent.     Per [facility] Hematology telephone note, patient's son called this morning to report that his brother stopped by patient's home but she did not know who he was. She further exhibited somewhat delusional thinking, believing that she [place] but was aware of her nonsensical sayings. Patient does note that her hydroxyurea dose was recently cut in half. Daughter-in-law states that patient's mental status is markedly improved from this morning. She further notes that patient has less color in her cheeks today than usual and has less energy than usual. Denies cough, fever, chills, chest pain, shortness of breath, weakness, abdominal pain, other rashes, new urinary symptoms, or changes in bowel movements.      Past Medical History:   Diagnosis Date   ??? CAD (coronary artery disease)    ??? Cancer (CMS-HCC)    ??? Hypertension        Patient Active Problem List   Diagnosis   ??? 2-vessel coronary artery disease   ??? Thoracoabdominal aneurysm (CMS-HCC)   ??? Chest pain, unspecified   ??? Chronic kidney disease, stage 3   ??? Descending thoracic aortic aneurysm (CMS-HCC)   ??? Ectatic thoracic aorta (CMS-HCC)   ??? Essential hypertension   ??? Hyperlipidemia   ??? IGT (impaired glucose tolerance)   ??? Increased frequency of urination   ??? Laxity of skin   ??? Palpitations   ??? Rectocele, female   ??? Rosacea   ??? Primary myelofibrosis (CMS-HCC)   ??? Altered mental status   ??? AKI (acute kidney injury) (CMS-HCC)   ??? UTI (urinary tract infection)   ??? Fall       No past surgical history on file.      Current Facility-Administered Medications:   ???  acetaminophen (TYLENOL) tablet 650 mg, 650 mg, Oral, Q4H PRN, Kai Levins, MD, 650 mg at 04/02/19 1043  ???  aspirin chewable tablet 81 mg, 81 mg, Oral, Daily, Kai Levins, MD, 81 mg at 04/02/19 1044  ???  carboxymethylcellulose sodium (REFRESH CELLUVISC) 1 % ophthalmic gel 1 drop, 1 drop, Both Eyes, BID, Kai Levins, MD, 1 drop at 04/02/19 1219  ???  cephalexin (KEFLEX) capsule 500 mg, 500 mg, Oral, TID, Veneta Penton, MD, 500 mg at 04/02/19 1200  ???  famotidine (PEPCID) tablet 20 mg, 20 mg, Oral, BID, Kai Levins, MD, 20 mg at 04/02/19 1044  ???  gabapentin (NEURONTIN) capsule 100 mg, 100 mg, Oral, TID, Cameron Ali, MD, 100 mg at 04/02/19 1043  ???  heparin (porcine) injection 5,000 Units, 5,000 Units, Subcutaneous, Q8H SCH, Kai Levins, MD, 5,000 Units at 04/02/19 0603  ???  lidocaine (XYLOCAINE) 2% viscous mucosal solution, 15 mL, Mouth, Q4H  PRN, Veneta Penton, MD  ???  melatonin tablet 3 mg, 3 mg, Oral, Nightly PRN, Kai Levins, MD, 3 mg at 04/01/19 2039  ???  multivitamins, therapeutic with minerals tablet 1 tablet, 1 tablet, Oral, Daily, Kai Levins, MD, 1 tablet at 04/02/19 1044  ???  polyethylene glycol (MIRALAX) packet 17 g, 17 g, Oral, Daily PRN, Kai Levins, MD  ???  pravastatin (PRAVACHOL) tablet 40 mg, 40 mg, Oral, Nightly, Kai Levins, MD, 40 mg at 04/01/19 2042  ???  valACYclovir (VALTREX) tablet 1,000 mg, 1,000 mg, Oral, BID, Cameron Ali, MD, 1,000 mg at 04/02/19 1043    Allergies  Patient has no known allergies.    History reviewed. No pertinent family history.    Social History  Social History     Tobacco Use   ??? Smoking status: Former Smoker   ??? Smokeless tobacco: Never Used   Substance Use Topics   ??? Alcohol use: Yes   ??? Drug use: Not on file       Review of Systems  A 10 point review of systems was performed and negative except as noted in the history of present illness.     Physical Exam     This provider entered the patient's room: Yes:    ? If this provider did not enter the room, a comprehensive physical exam was not able to be performed due to increased infection risk to themselves, other providers, staff and other patients), as well as to conserve personal protective equipment (PPE) utilization during the COVID-19 pandemic.    ? If this provider did enter the patient room, the following was PPE worn: Surgical mask, eye protection and gloves    ED Triage Vitals [03/31/19 1729]   Enc Vitals Group      BP 111/87      Pulse       SpO2 Pulse 74      Resp 18      Temp 36.9 ??C (98.4 ??F)      Temp src       SpO2 98 %     Constitutional: Alert and oriented.  Nontoxic appearing and in no distress.  Eyes: Conjunctivae are normal.  ENT       Head: Normocephalic and atraumatic.       Nose: No congestion.       Mouth/Throat:  Mucous membranes are moist.  No noted abscess intraorally or drainage.       Neck: No stridor.  Hematological/Lymphatic/Immunilogical: No cervical lymphadenopathy.  Cardiovascular: Normal rate, regular rhythm. Normal and symmetric distal pulses are present in all extremities.  Respiratory: Normal respiratory effort. Breath sounds are normal.  Gastrointestinal: Soft and nontender. There is no CVA tenderness.  Musculoskeletal: Normal range of motion in all extremities.       Right lower leg: No tenderness or edema.       Left lower leg: No tenderness or edema.  Neurologic: Normal speech and language. No gross focal neurologic deficits are appreciated.  Skin: Skin is warm, dry and intact. No rash noted.  Psychiatric: Mood and affect are normal. Speech and behavior are normal.     EKG     EKG as interpreted by myself: Sinus rhythm with multiple PVCs of similar morphology.  Normal axis, normal intervals, no ST elevation, no ST depression, T wave inversion only in leads V1 and V2 as well as aVR.  Impression: Sinus rhythm with PVCs.     Radiology     CT Head  Wo Contrast   Final Result   No acute intracranial abnormality.      Global cerebral atrophy, periventricular matter disease, and remote infarcts as described above.      XR Chest 2 views   Final Result      No acute airspace disease. See discussion regarding descending thoracic aorta above.         Attestations     Documentation assistance was provided by Mliss Sax, Scribe, on March 31, 2019 at 5:44 PM for Lasandra Beech, MD.    A scribe was used when documenting this visit. I agree with the above documentation. Signed by  Katherina Mires on  March 31, 2019 at 6:52 PM.           Blima Singer, MD  04/02/19 [id number]

## 2019-04-01 NOTE — Unmapped (Signed)
Patient remains free of falls this hospitalization.  Patient calls appropriately for assistance.  Bed alarm on for safety.  Patient with blood pressure readings elevated.    Problem: Adult Inpatient Plan of Care  Goal: Plan of Care Review  Outcome: Ongoing - Unchanged  Goal: Patient-Specific Goal (Individualization)  Outcome: Ongoing - Unchanged  Goal: Absence of Hospital-Acquired Illness or Injury  Outcome: Ongoing - Unchanged  Goal: Optimal Comfort and Wellbeing  Outcome: Ongoing - Unchanged  Goal: Readiness for Transition of Care  Outcome: Ongoing - Unchanged  Goal: Rounds/Family Conference  Outcome: Ongoing - Unchanged     Problem: Fall Injury Risk  Goal: Absence of Fall and Fall-Related Injury  Outcome: Ongoing - Unchanged     Problem: Hypertension Comorbidity  Goal: Blood Pressure in Desired Range  Outcome: Ongoing - Unchanged

## 2019-04-01 NOTE — Unmapped (Signed)
Geriatrics History and Physical    Assessment/Plan:    Principal Problem:    Altered mental status  Active Problems:    2-vessel coronary artery disease    Essential hypertension    Primary myelofibrosis (CMS-HCC)    AKI (acute kidney injury) (CMS-HCC)    UTI (urinary tract infection)    Fall  Resolved Problems:    * No resolved hospital problems. *      Hayley Wagner is a 86 y.o. 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     AMS - UTI: AMS most likely delirium due to UTI, given waxing and waning nature of symptoms. No overtly sedating medications on home med list, CT Head without acute abnormalities. S/p CTX in ED.   - f/u BCx from ED  - TSH added on  -  Utox  - Continue IV CTX for now pending UCx  - Fall precautions, delirium precautions  - PT/OT  - Consider geriatrics/cognitive assessment when able to evaluate for possible underlying cognitive impairment or dementia given her multiple previous TIA's and cerebral atrophy seen on imaging    AKI: most likely prerenal in setting of infection and recent decreased fluid intake  - s/p 1L bolus in ED. Recheck BMP in AM  - Hold home losartan-hydrochlorothiazide and NSAIDs    Primary myelofibrosis - Anemia: follows with Dr. [name] and [name] from [facility], is on ruxolitinib (JAK inhibitor). Of note, dose was reduced at end of December due to cytopenias. Her Hgb today is 8.5, slightly down from previous baseline ~10. Per chart review has a history of iron deficiency anemia with difficulty tolerating PO supplementation  - Iron studies added on  - Touch base with Dr. [name] and NP Kathrynn Humble in the morning regarding patient's admission  - Would also discuss with her outpatient team vs. Hematology on-call regarding whether to continue her ruxolitinib while inpatient, and if so what dose    Mouth pain: pain along lower jaw. No overt signs of infection on exam, unclear etiology of pain.   - Viscous lidocaine PRN    Chronic conditions:   - CAD: home ASA 81, statin  - HTN: hold losartan-hydrochlorothiazide in setting of AKI      Daily Checklist:  Diet: Regular Diet  DVT PPx: Heparin 5000units q8h  Electrolytes: No Repletion Needed  Code Status: Full Code <-- of note, per chart review pt has a DNR/DNI form that she has previous signed with her PCP, however on admission today she clarifies that she would want a resuscitation attempt made if it the likelihood of success were high. However, she would not want futile measures to be continued. Will leave full code for now, but would discuss further in AM  Dispo: Admit to Med A          ___________________________________________________________________    Chief Complaint:  Chief Complaint   Patient presents with   ??? Altered Mental Status     HPI:  Hayley Wagner is a 86 y.o. 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 Had a mechanical fall a few days ago where she tripped on her feet, fell backwards and  hit her head. Felt fine afterwards. This morning, woke up feeling confused today and had insight into her confusion, but didn't know how to correct it.  Also has felt weak and cold during the day today as well (though mostly because the heater in her house broke).  She endorses discomfort in her right lower maxilla around her dentures but denies any fevers or chills. Of note, has been experiencing profound fatigue for the past six months which is thought to be related to her myelofibrosis. Has had previous episodes of mild memory loss for the past few weeks, but not confusion to this extent. Her daughter in law notes that over the course of today she has had a waxing and waning mental status, and they have occasionally noticed similar (though milder) waxing and waning confusion in past as well.   ??  Patient's son stopped by patient's home but she did not know who he was, which is an acute change for her. She further exhibited somewhat delusional thinking, believing that she lived on top of a mountain but had insight into the fact that these were nonsensical phrases. Denies cough, fever, chills, chest pain, shortness of breath, weakness, abdominal pain, other rashes, new urinary symptoms, or changes in bowel movements. Does have some baseline urinary incontinence so thinks she may have decreased her fluid intake recently because of that.     VSS on admit. Labs with Hgb 8.5 slightly lower than baseline ~10, AKI with Cr 1.25 up from 0.9. COVID/flu negative. UA concerning for possible UTI. CT Head with global cerebral atrophy, periventricular white matter disease, and remote infarcts, however no acute abnormalities.         Allergies:  Patient has no known allergies.    Medications:   Prior to Admission medications    Medication Dose, Route, Frequency   ascorbate calcium, bulk, Powd Oral   aspirin 81 MG chewable tablet Oral   betamethasone valerate (VALISONE) 0.1 % lotion Topical   diltiazem (CARDIZEM CD) 240 MG 24 hr capsule No dose, route, or frequency recorded.   ERGOCALCIFEROL, VITAMIN D2, ORAL 1 capsule, Oral   famotidine (PEPCID) 20 MG tablet 20 mg, Oral, 2 times a day (standard)   hydrocortisone 2.5 % cream Topical   ibuprofen (MOTRIN) 800 MG tablet No dose, route, or frequency recorded.   losartan-hydrochlorothiazide (HYZAAR) 50-12.5 mg per tablet 1 tablet, Oral   metroNIDAZOLE (METROGEL) 1 % gel Small amount as needed twice a day   multivitamin (TAB-A-VITE/THERAGRAN) per tablet 1 tablet, Oral   mupirocin (BACTROBAN) 2 % ointment Apply 1 application topically 2 (two) times daily. Behind R ear   naproxen sodium (ALEVE) 220 MG tablet 220 mg, Oral   RESTASIS MULTIDOSE 0.05 % Drop No dose, route, or frequency recorded.   ruxolitinib (JAKAFI) 5 mg tablet 10 mg, Oral, 2 times a day (standard)   simvastatin (ZOCOR) 20 MG tablet TAKE 1 TABLET BY MOUTH ONCE DAILY       Medical History:  Past Medical History:   Diagnosis Date   ??? CAD (coronary artery disease)    ??? Cancer (CMS-HCC)    ??? Hypertension        Surgical History:  No past surgical history on file.    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 Remote previous smoking history. Drinks about 3/4 glass of wine every night. No history of MJ or IVDU.       Family History:  Sister with Alzheimer's dementia  Review of Systems:  10 systems reviewed and are negative unless otherwise mentioned in HPI    Labs/Studies:  Labs and Studies from the last 24hrs per EMR and Reviewed    Physical Exam:  Temp:  [36.9 ??C-37.1 ??C] 37 ??C  Heart Rate:  [84-85] 85  SpO2 Pulse:  [74-84] 84  Resp:  [18-19] 19  BP: (111-153)/(63-87) 153/67  SpO2:  [96 %-99 %] 99 %,     Intake/Output Summary (Last 24 hours) at 03/31/2019 2326  Last data filed at 03/31/2019 2050  Gross per 24 hour   Intake 1100 ml   Output ???   Net 1100 ml   ,   Wt Readings from Last 3 Encounters:   03/31/19 56.5 kg (124 lb 9.6 oz)   03/20/19 59 kg (130 lb 1.1 oz)   02/28/19 58.4 kg (128 lb 13.7 oz)       Gen: pleasant   HEENT: sclerae anicteric. No overt areas of swelling/erythma along gums  CV: RRR, no m/r/g  Pulm: CTAB, normal WOB on RA  Abd: soft, NTND  Extr: WWP, nontender  Skin: no rashes  Neuro: Occasionally repeating questions, otherwise fully alert and oriented, moving all extremities

## 2019-04-01 NOTE — Unmapped (Signed)
When son went to check on pt this morning pt was confused and didn't know son. Pt AMS was worse this afternoon. Pt oncology referred pt to ER to R/O stroke or anything else

## 2019-04-01 NOTE — Unmapped (Addendum)
??  PCP Post-Discharge Follow Up Issues:   -- Please recheck CBC with differential in around 2 weeks, as patient is on Jakafi for myelofibrosis, per her oncology team.  -- Please continue to monitor the resolution of her likely shingles infection on her right face.   -- Patient scored 22/30 on SLUMS examination. She had continued mild delirium in the hospital, but please further monitor her cognitive status.   To contact the discharging attending physician with regard to the patient's hospitalization or follow-up needs, please call: [facility] Division of Geriatrics [phone]).   ??    Outpatient follow-up:    Hospital Course:    Hayley Wagner??is a 86 y.o.??female??with a PMH of primary myelofibrosis (on Ruxolitinib 10 mg PO BID), CAD, thoracoabdominal aneurysm, HTN, presenting for altered mental status (AMS) due to a UTI. She also developed painful lesions in the V2 distribution of her face, consistent with shingles infection. Her hospital course is summarized by problem list below.   ??  AMS (improved) - UTI: The patient presented with AMS most consistent with delirium, given the waxing and waning nature of her symptoms. She was not found to be on any particularly sedating medications on home. However, she was noted to have an AKI on admission (later resolved), and her home losartan-hydrochlorothiazide and NSAIDs were held. In the ED, CT scan of the head demonstrated global cerebral atrophy and remote infarcts but no acute abnormalities. Urinalysis was concerning for UTI, and she was initially started on ceftriaxone on 2/5. After urine culture sensitivities resulted, she was switched to Keflex on 2/7 to complete a 7-day total course of antibiotics (ending on 04/08/19). The patient had a SLUMS exam completed during her hospitalization, notable for a score of 22/30. This is suggestive of underlying cognitive impairment and warrants further workup. See below for complete geriatric assessment. She had one incident where she was found on the ground by nursing staff, without injury. However, the patient did not recall this happening the next day. She was maintained on fall and delirium precautions throughout her hospital stay. PT and OT were actively involved in this patient's management and, based on her status prior to discharge, felt continued 5x weekly rehab at a skilled nursing facility was warranted. The patient is confined to one room at home and was therefore offered a bedside commode. The patient was discharged to a skilled nursing facility for continued rehab with the expectation that she will return home. Prior to discharge, her delirium had significantly improved but not fully resolved.     Facial Lesions - Shingles: The patient presented with pain along her right cheek and mouth. On 2/7, she developed a vesicular rash in the right V2 distribution, most concerning for shingles infection, given its appearance and acute onset. Painful lesions developed on her right cheek, lip, and inside her mouth on the upper right cheek. She was started on Valacyclovir 1g BID on 2/8 for a 7-day course (2/8-2/14). She was also given scheduled Tylenol, gabapentin 100 mg TID, and viscous Lidocaine solution for pain management. After initiating acyclovir, her AST/ALT increased to 184/139, but decreased the next day to 117/113. Her initial facial swelling and erythema significantly improved and now with significant crusting. Gabapentin was discontinued on discharge as it may be contributing to mild delirium.   ??  Primary myelofibrosis - Anemia: The patient follows with Dr. Porfirio Oar and [name] from [facility]Oncology. She is currently taking ruxolitinib (JAK inhibitor), which was dose reduced in late December 2020 due  to cytopenias. Her Hgb on admission was 8.5, then remained stable around 7.2 afterwards. She did not receive Jakafi while inpatient. Her oncologists were contacted and recommended that she be discharged on her current dose of ruxolitinib (5mg  BID) and have a CBC with differential rechecked in 2 weeks.  ????  Chronic conditions:   CAD: She was continued on her home low-dose aspirin and statin.   HTN: The patient's home losartan-hydrochlorothiazide and diltiazem were held during her hospitalization, as she maintained low to normal blood pressures.       **GERIATRIC ASSESSMENT: **     Hayley Wagner is admitted with confusion and has been found to have a UTI, AKI as well as finding of zoster on her face.     She informs me that she lives in [place] in her own home. She has two close friends whom check in with her and will visit with her (apparently just recently, and with masks) as well as a paid care giver whom comes in daily 4 hours a day mainly for assistance in chores, meal preparation, cleaning up as well as driving her to her appointments and driving her to go shopping.     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 She has a Best boy in music therapy. She laments that since COVID this has all changed, she states she is looking into trying to get at least one client at least once a week because she loves my work.     She was previously married, her husband died many years ago when he was 40.       Geriatric ROS  Vision impairment:    Yes, reports left eye needs corneal transplant Hearing impairment:   None  Swallowing impairment:  None   Urinary incontinence:  At times, stress Fecal incontinence:   None  Constipation:   None    Falls/Gait:   One fall 10 days ago. No falls within the last year prior to this.  Denies feeling low, down nor hopeless, quite the opposite she reports     Sleep disturbance:   None Unintentional weight loss:  Denies  Behavior disturbance:   None           Admit Date:  Functional Assessment        ADLs:  IADLs:   Feeding: Independent  Dressing: Independent  Ambulation: Independent Toileting: Independent  Bathing: Independent   Using the phone: Independent  Shopping: Requires Assistance  Meal preparation: Independent  Medication mgmt: Independent  Managing finances: Independent  Housework: IT consultant (driving or navigating public transit): Dependent     Living situation: Patient lives in own home with noone (lives alone).     Changes in ADLs during hospitalization:  None     Assistive devices: None     Additional services recommended at discharge: None     Cognitive Assessment     Delirium Assessment: CAM (Confusion Assessment Method):    On discharge, the patient did not show evidence of delirium. .      Other cognitive assessment:  The patient scored 22/30 [facility] Mental Status Exam (SLUMS). This is suggestive of cognitive impairment and warrants further follow-up as an outpatient.      Advance Care Planning     There is some conflicting information and likely misunderstanding (due to circumstances in the hospital, and need  to double mask) re: what her goals of care are including Advanced Care Planning.     She stated that she would like full course of resuscitation should her heart rhythm changes so it is not compatible with life or if she stops breathing.     She also states that she previously talked to her PCP (an NP at [facility]) that she would like a natural death and not be kept alive by artificial means.     Thus, I simply stressed that once out of the hospital and at follow up with PCP she makes sure her wishes are clearer and that she revisit this issue.         Code Status: Full Code    Health Care Decision Maker as of 04/01/2019         Surrogate decision maker: Kittie Plater (son)     Emergency Contact:  Extended Emergency Contact Information  Primary Emergency Contact: Hayley Wagner,Hayley Wagner  Mobile Phone: [phone]  Relation: Son  Secondary Emergency Contact: Remer Macho  Mobile Phone: [phone]  Relation: Daughter in [place]  Preferred language: ENGLISH  Interpreter needed? No

## 2019-04-02 LAB — URINALYSIS
BILIRUBIN UA: NEGATIVE
BLOOD UA: NEGATIVE
GLUCOSE UA: NEGATIVE
KETONES UA: 15 — AB
LEUKOCYTE ESTERASE UA: NEGATIVE
NITRITE UA: NEGATIVE
PH UA: 5.5 (ref 5.0–9.0)
RBC UA: <1 /HPF (ref <4)
SPECIFIC GRAVITY UA: 1.02 (ref 1.005–1.040)
SQUAMOUS EPITHELIAL: 3 /HPF (ref 0–5)
UROBILINOGEN UA: 0.2
WBC UA: 22 /HPF — ABNORMAL HIGH (ref 0–5)

## 2019-04-02 LAB — COMPREHENSIVE METABOLIC PANEL
ALBUMIN: 3.4 g/dL — ABNORMAL LOW (ref 3.5–5.0)
ALKALINE PHOSPHATASE: 84 U/L (ref 38–126)
ALT (SGPT): 18 U/L (ref <35)
ANION GAP: 9 mmol/L (ref 7–15)
AST (SGOT): 40 U/L — ABNORMAL HIGH (ref 14–38)
BILIRUBIN TOTAL: 0.7 mg/dL (ref 0.0–1.2)
BLOOD UREA NITROGEN: 25 mg/dL — ABNORMAL HIGH (ref 7–21)
BUN / CREAT RATIO: 28
CALCIUM: 8.3 mg/dL — ABNORMAL LOW (ref 8.5–10.2)
CHLORIDE: 108 mmol/L — ABNORMAL HIGH (ref 98–107)
CO2: 23 mmol/L (ref 22.0–30.0)
CREATININE: 0.9 mg/dL (ref 0.60–1.00)
EGFR CKD-EPI NON-AA FEMALE: 57 mL/min/{1.73_m2} — ABNORMAL LOW (ref >=60)
GLUCOSE RANDOM: 86 mg/dL (ref 70–179)
POTASSIUM: 3.8 mmol/L (ref 3.5–5.0)
PROTEIN TOTAL: 5.9 g/dL — ABNORMAL LOW (ref 6.5–8.3)
SODIUM: 140 mmol/L (ref 135–145)

## 2019-04-02 LAB — CBC W/ AUTO DIFF
BASOPHILS ABSOLUTE COUNT: 0.1 10*9/L (ref 0.0–0.1)
EOSINOPHILS ABSOLUTE COUNT: 0.1 10*9/L (ref 0.0–0.7)
EOSINOPHILS RELATIVE PERCENT: 1.5 %
HEMATOCRIT: 21.6 % — ABNORMAL LOW (ref 35.0–44.0)
LYMPHOCYTES ABSOLUTE COUNT: 0.6 10*9/L — ABNORMAL LOW (ref 0.7–4.0)
LYMPHOCYTES RELATIVE PERCENT: 11.8 %
MEAN CORPUSCULAR HEMOGLOBIN CONC: 34.2 g/dL (ref 30.0–36.0)
MEAN CORPUSCULAR HEMOGLOBIN: 30.7 pg (ref 26.0–34.0)
MEAN CORPUSCULAR VOLUME: 89.8 fL (ref 82.0–98.0)
MEAN PLATELET VOLUME: 8.4 fL (ref 7.0–10.0)
MONOCYTES ABSOLUTE COUNT: 1 10*9/L (ref 0.1–1.0)
MONOCYTES RELATIVE PERCENT: 19.4 %
NEUTROPHILS ABSOLUTE COUNT: 3.2 10*9/L (ref 1.7–7.7)
NEUTROPHILS RELATIVE PERCENT: 65.9 %
PLATELET COUNT: 143 10*9/L — ABNORMAL LOW (ref 150–450)
RED BLOOD CELL COUNT: 2.41 10*12/L — ABNORMAL LOW (ref 3.90–5.03)
RED CELL DISTRIBUTION WIDTH: 21.9 % — ABNORMAL HIGH (ref 12.0–15.0)
WBC ADJUSTED: 4.9 10*9/L (ref 3.5–10.5)

## 2019-04-02 LAB — TOXICOLOGY SCREEN, URINE
AMPHETAMINE SCREEN URINE: <500
BARBITURATE SCREEN URINE: <200
CANNABINOID SCREEN URINE: <20
COCAINE(METAB.)SCREEN, URINE: <150
OPIATE SCREEN URINE: <300

## 2019-04-02 LAB — PROTEIN UA: Protein:MCnc:Pt:Urine:Qn:Test strip: NEGATIVE

## 2019-04-02 LAB — ALT (SGPT): Alanine aminotransferase:CCnc:Pt:Ser/Plas:Qn:: 18

## 2019-04-02 LAB — PLATELET COUNT: Platelets:NCnc:Pt:Bld:Qn:Automated count: 143 — ABNORMAL LOW

## 2019-04-02 LAB — OPIATE SCREEN URINE: Lab: <300

## 2019-04-02 NOTE — Unmapped (Signed)
Pt in AMS, UTI.  VSS.  Receiving IV antibiotics, tolerating without difficulty.  Unsteady gait, standby assistance required with ADLS.  Verbalized understanding of POC.  Will continue to work towards prioritized goals.      Problem: Adult Inpatient Plan of Care  Goal: Plan of Care Review  Outcome: Progressing     Problem: Fall Injury Risk  Goal: Absence of Fall and Fall-Related Injury  Outcome: Progressing

## 2019-04-02 NOTE — Unmapped (Signed)
A/Ox 3 this shift. Forgetful at times. Up to bedside commode independently. Will continue to monitor.   Problem: Adult Inpatient Plan of Care  Goal: Plan of Care Review  Outcome: Ongoing - Unchanged  Goal: Patient-Specific Goal (Individualization)  Outcome: Ongoing - Unchanged  Goal: Absence of Hospital-Acquired Illness or Injury  Outcome: Ongoing - Unchanged  Goal: Optimal Comfort and Wellbeing  Outcome: Ongoing - Unchanged  Goal: Readiness for Transition of Care  Outcome: Ongoing - Unchanged  Goal: Rounds/Family Conference  Outcome: Ongoing - Unchanged     Problem: Fall Injury Risk  Goal: Absence of Fall and Fall-Related Injury  Outcome: Ongoing - Unchanged     Problem: Hypertension Comorbidity  Goal: Blood Pressure in Desired Range  Outcome: Ongoing - Unchanged     Problem: UTI (Urinary Tract Infection)  Goal: Improved Infection Symptoms  Outcome: Ongoing - Unchanged

## 2019-04-02 NOTE — Unmapped (Signed)
PHYSICAL THERAPY  Evaluation (04/01/19 1630)     Patient Name:  Hayley Wagner       Medical Record Number: [id number]   Date of Birth: 12-02-1928  Sex: Female            Treatment Diagnosis: evaluate and treat, falls    ASSESSMENT  Problem List: Decreased cognition, Gait deviation, Impaired balance, Fall Risk     Assessment : Hayley Wagner is a 86 y.o. 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      Pt presents to PT with decreased memory, decreased balance, and unsteadiness with ambulation impairing pt's independence with functional mobility that would benefit from acute PT services to address.  Pt was abel to ambulate for 29' with SPC and SBA, unsteady with path deviation but no LOB.  Pt demonstrating decreased balance with testing, unable to maintain tandem stance.  Recommend post acute services 3x/week.     Today's Interventions: [facility] 19/24    Personal Factors/Comorbidities Present: 3+       Examination of Body System: 3-5 elements       Clinical Decision Making: Moderate     PLAN  Planned Frequency of Treatment:  1-2x per day for: 3-4x week      Planned Interventions: Balance activities, Education - Patient, Endurance activities, Functional mobility, Gait training, Home exercise program, Self-care / Home training, Stair training, Therapeutic exercise, Therapeutic activity, Transfer training    Post-Discharge Physical Therapy Recommendations:  3x weekly    PT DME Recommendations: None           Goals:   Patient and Family Goals: To return home.     SHORT GOAL #1: Pt will perform all OOB transfers with Mod I assist using LRAD.              Time Frame : 2 weeks  SHORT GOAL #2: Pt will ambulate for 150' with Mod I assist and LRAD,              Time Frame : 2 weeks  SHORT GOAL #3: Pt will go up/down 2 steps with unilateral support and supervison assist.              Time Frame : 2 weeks     Prognosis:  Good  Positive Indicators: PLOF, support system  Barriers to Discharge: Endurance deficits, Cognitive deficits, Impaired Balance, Gait instability, Inability to safely perform ADLS    SUBJECTIVE  Patient reports: I am still having trouble remembering.  Current Functional Status: Pt sitting EOB with RN at bedside at beginning of session and left supine in bed at end of session with all needs in reach.  Services patient receives: PT, OT  Prior Functional Status: Pt lives in a tiny house, ambulates with a cane or no device and uses furniture/walls for support, pt's neighbors check in regularly as well as ex- daughter in law, pt has a life alert but it is not yet activated.  Equipment available at home: Gilmer Mor     Past Medical History:   Diagnosis Date   ??? CAD (coronary artery disease)    ??? Cancer (CMS-HCC)    ??? Hypertension     Social History     Tobacco Use   ??? Smoking status: Former Smoker   ??? Smokeless tobacco: Never Used   Substance Use Topics   ???  Alcohol use: Yes      No past surgical history on file. History reviewed. No pertinent family history.     Allergies: Patient has no known allergies.     Objective Findings  Precautions / Restrictions  Precautions: Falls precautions  Weight Bearing Status: Non-applicable  Required Braces or Orthoses: Non-applicable    Communication Preference: Verbal   Pain Comments: gum/lip pain  Medical Tests / Procedures: reviewed in chart  Equipment / Environment: Vascular access (PIV, TLC, Port-a-cath, PICC)    At Rest: VSS  With Activity: SOB after ambulation in hallway and after reaching for purse/jacket hanging on bedside pole.  Orthostatics: asymptomatic  Airway Clearance: ambulation    Living Situation  Living Environment: House  Lives With: Alone  Home Living: One level home, Tub/shower unit, Walk-in shower, Stairs to alternate level without rails, Hand-held shower hose  Number of Stairs: 2     Cognition: alert and follows commands, states she knows when she is answering a question wrong, told someone she lived in [place], but know that she doesn't anymore, very aware of memory changes     Skin Inspection: visible areas c/d/i    UE ROM: WFL  UE Strength: WFL  LE ROM: WFL  LE Strength: WFL                Coordination: gross motor movements intact      Sensation: intact to light touch B LE's  Balance: Sitting: Mod I EOB; Standing: needs B UE support and SBA, maintains narrow stance with feet together for 10 seconds, unable to maintain tandem stance for > 5 seconds bilaterally.         Bed Mobility: Mod I with supine<>sit transfers  Transfers: Supervision with sit<>stand transfers.   Gait  Level of Assistance: Standby assist, set-up cues, supervision of patient - no hands on  Assistive Device: Cane(and wall rail in hallway, reaching for furniture)  Distance Ambulated (ft): 80 ft  Gait: Pt amb for 80' with SPC and hand hold or wall rail assist, unsteady with path deviation balance reaction/step-strategy to maintain balance.         Endurance: SOB with ambualtion and overhead reaching    Physical Therapy Session Duration  PT Individual - Duration: 40    Medical Staff Made Aware: RN Vonnie    I attest that I have reviewed the above information.  Signed: Anders Grant, PT  Filed 04/01/2019

## 2019-04-02 NOTE — Unmapped (Signed)
Geriatrics (MEDA) Progress Note    Assessment & Plan:   Hayley Wagner is a 86 y.o. female with a PMHx of primary myelofibrosis (on Ruxolitinib), CAD, thoracoabdominal aneurysm, and HTN that presented to [facility] with AMS found to have a UTI.    Principal Problem:    Altered mental status  Active Problems:    2-vessel coronary artery disease    Essential hypertension    Primary myelofibrosis (CMS-HCC)    AKI (acute kidney injury) (CMS-HCC)    UTI (urinary tract infection)    Fall  Resolved Problems:    * No resolved hospital problems. *    UTI: Presented with acute AMS (likely delirium given waxing and waning symptoms) which have since resolved. Urine culture growing E. Coli sensitive to everything except ampicillin. Initially treated with CTX transitioned over to Keflex to complete course. No signs of pyelonephritis and Bcx negative.   -Continue Keflex (2/7- )    Shingles: Pt presented with pain along right cheek and mouth and on 2/7 developed vesicular rash to the cheek and inner lip in the right V2 distribution most concerning for Shingles likely onset given acute infection.   -Valacyclovir 1000mg  BID  -Gabapentin 100mg  TID for pain  -Lidocaine mucosal solution 15mL every 4 hr PRN for mouth pain    Primary myelofibrosis - Anemia: Follows with Dr. Anise Wagner and Hayley Wagner from [facility], is on ruxolitinib (JAK inhibitor). Of note, dose was reduced at end of December due to cytopenias. Inbasket message sent to pt's primary hematologists to inform of admission.   -Hold home Ruxolitinib in setting of acute infection, can restart on discharge.     AKI (resolved): On admission had Acute Kidney Injury with Cr bump to 1.25 from baseline of around 0.9. Now back to baseline after fluids and tx of acute infection.    CAD: Continuing home ASA and statin    HTN: Holding home Losartan-hydrochlorothiazide, currently with low to normal Bps.     Daily Checklist:  Diet: Regular Diet  DVT PPx: Heparin 5000units q8h  Electrolytes: No Repletion Needed  Code Status: Full Code  Dispo: Admit to Med A- Floor    Subjective:   Patient had improvement of overall cognition between yesterday and today. She states that she remembers phone number much more clearly and feels much less foggy today. Unfortunately she has developed a rash over her right check/inner lip that overlies the area of pain present yeterday. She describes the pain in the area as burning and numb. She has not had much of an appetite as it is painful for her to eat.     Objective:   Temp:  [36.3 ??C-36.8 ??C] 36.8 ??C  Heart Rate:  [82-96] 85  Resp:  [16-18] 16  BP: (93-111)/(51-61) 93/51  SpO2:  [91 %-97 %] 97 %    Gen: WDWN  in NAD, alert, oriented, answers questions appropriately  HEENT: erythema over V2 distribution of right cheek with overlying multiple small clear vesicles without drainage. Vesicles present over the inside of her right upper lip and one open sore present along the inside of the right cheek. All vesicles currently intact without weeping, drainage, or crusting.   Heart: RRR, S1, S2, no M/R/G, no chest wall tenderness  Lungs: CTAB, no crackles or wheezes, no use of accessory muscles  Abdomen: Normoactive bowel sounds, soft, NTND  Extremities: no clubbing, cyanosis, or edema  Psych: Appropriate mood and affect    Labs/Studies: Labs and Studies from the last 24hrs  per EMR and Reviewed     Marnette Burgess  [facility] Internal Medicine, PGY-1  Pg: [phone]

## 2019-04-03 LAB — COMPREHENSIVE METABOLIC PANEL
ALBUMIN: 3.3 g/dL — ABNORMAL LOW (ref 3.5–5.0)
ALKALINE PHOSPHATASE: 80 U/L (ref 38–126)
ALT (SGPT): 26 U/L (ref <35)
ANION GAP: 8 mmol/L (ref 7–15)
AST (SGOT): 51 U/L — ABNORMAL HIGH (ref 14–38)
BLOOD UREA NITROGEN: 30 mg/dL — ABNORMAL HIGH (ref 7–21)
BUN / CREAT RATIO: 31
CALCIUM: 8.4 mg/dL — ABNORMAL LOW (ref 8.5–10.2)
CHLORIDE: 106 mmol/L (ref 98–107)
CO2: 25 mmol/L (ref 22.0–30.0)
CREATININE: 0.96 mg/dL (ref 0.60–1.00)
EGFR CKD-EPI NON-AA FEMALE: 53 mL/min/{1.73_m2} — ABNORMAL LOW (ref >=60)
GLUCOSE RANDOM: 96 mg/dL (ref 70–179)
POTASSIUM: 3.9 mmol/L (ref 3.5–5.0)
PROTEIN TOTAL: 5.9 g/dL — ABNORMAL LOW (ref 6.5–8.3)
SODIUM: 139 mmol/L (ref 135–145)

## 2019-04-03 LAB — CBC W/ AUTO DIFF
BASOPHILS ABSOLUTE COUNT: 0.1 10*9/L (ref 0.0–0.1)
BASOPHILS RELATIVE PERCENT: 1.3 %
EOSINOPHILS ABSOLUTE COUNT: 0 10*9/L (ref 0.0–0.7)
EOSINOPHILS RELATIVE PERCENT: 1 %
HEMATOCRIT: 20.8 % — ABNORMAL LOW (ref 35.0–44.0)
HEMOGLOBIN: 7.2 g/dL — ABNORMAL LOW (ref 12.0–15.5)
LYMPHOCYTES ABSOLUTE COUNT: 0.4 10*9/L — ABNORMAL LOW (ref 0.7–4.0)
LYMPHOCYTES RELATIVE PERCENT: 9.4 %
MEAN CORPUSCULAR HEMOGLOBIN CONC: 34.4 g/dL (ref 30.0–36.0)
MEAN CORPUSCULAR HEMOGLOBIN: 30.7 pg (ref 26.0–34.0)
MONOCYTES ABSOLUTE COUNT: 0.8 10*9/L (ref 0.1–1.0)
MONOCYTES RELATIVE PERCENT: 18.1 %
NEUTROPHILS ABSOLUTE COUNT: 3.2 10*9/L (ref 1.7–7.7)
NEUTROPHILS RELATIVE PERCENT: 70.2 %
PLATELET COUNT: 133 10*9/L — ABNORMAL LOW (ref 150–450)
RED BLOOD CELL COUNT: 2.34 10*12/L — ABNORMAL LOW (ref 3.90–5.03)
RED CELL DISTRIBUTION WIDTH: 22.2 % — ABNORMAL HIGH (ref 12.0–15.0)
WBC ADJUSTED: 4.6 10*9/L (ref 3.5–10.5)

## 2019-04-03 LAB — SODIUM: Sodium:SCnc:Pt:Ser/Plas:Qn:: 139

## 2019-04-03 LAB — WBC ADJUSTED: Leukocytes:NCnc:Pt:Bld:Qn:: 4.6

## 2019-04-03 MED ORDER — GABAPENTIN 100 MG CAPSULE
ORAL_CAPSULE | Freq: Three times a day (TID) | ORAL | 0 refills | 30 days | Status: CP
Start: 2019-04-03 — End: 2019-05-03

## 2019-04-03 MED ORDER — CEPHALEXIN 500 MG CAPSULE
ORAL_CAPSULE | Freq: Three times a day (TID) | ORAL | 0 refills | 4 days | Status: CP
Start: 2019-04-03 — End: 2019-04-07

## 2019-04-03 MED ORDER — LIDOCAINE HCL 2 % MUCOSAL SOLUTION
OROMUCOSAL | 0 refills | 0 days | Status: CP | PRN
Start: 2019-04-03 — End: ?

## 2019-04-03 MED ORDER — VALACYCLOVIR 1 GRAM TABLET
ORAL_TABLET | Freq: Two times a day (BID) | ORAL | 0 refills | 6 days | Status: CP
Start: 2019-04-03 — End: 2019-04-09

## 2019-04-03 NOTE — Unmapped (Signed)
Pt vss. No acute signs of distress noted. Pt with complaints of pain to face. PRN tylenol given overnight. Pt remains free from falls. Bed alarm set for safety. Call bell within reach.   Problem: Adult Inpatient Plan of Care  Goal: Plan of Care Review  Outcome: Progressing  Goal: Patient-Specific Goal (Individualization)  Outcome: Progressing  Goal: Absence of Hospital-Acquired Illness or Injury  Outcome: Progressing  Goal: Optimal Comfort and Wellbeing  Outcome: Progressing  Goal: Readiness for Transition of Care  Outcome: Progressing  Goal: Rounds/Family Conference  Outcome: Progressing     Problem: Fall Injury Risk  Goal: Absence of Fall and Fall-Related Injury  Outcome: Progressing     Problem: Hypertension Comorbidity  Goal: Blood Pressure in Desired Range  Outcome: Progressing     Problem: UTI (Urinary Tract Infection)  Goal: Improved Infection Symptoms  Outcome: Progressing     Problem: Infection  Goal: Infection Symptom Resolution  Outcome: Progressing

## 2019-04-03 NOTE — Unmapped (Signed)
Pt free of falls and injuries. Pt has been oob to the bathroom with one person standby assistance and a cane. Adequate nutritional intake. The right side of her face is still swollen and slightly pink; complaints about the inside of her mouth feeling sore. Upper and lower dentures in denture cup because they are irritating the inside of her mouth. Will continue to monitor.    Problem: Adult Inpatient Plan of Care  Goal: Plan of Care Review  Outcome: Progressing  Goal: Patient-Specific Goal (Individualization)  Outcome: Progressing  Goal: Absence of Hospital-Acquired Illness or Injury  Outcome: Progressing  Goal: Optimal Comfort and Wellbeing  Outcome: Progressing  Goal: Readiness for Transition of Care  Outcome: Progressing  Goal: Rounds/Family Conference  Outcome: Progressing     Problem: Fall Injury Risk  Goal: Absence of Fall and Fall-Related Injury  Outcome: Progressing     Problem: Hypertension Comorbidity  Goal: Blood Pressure in Desired Range  Outcome: Progressing

## 2019-04-03 NOTE — Unmapped (Signed)
Antibiotic Timeout Checklist  Indication for antibiotics: UTI  Antibiotic Start Date: 03/31/19  Current systemic antibiotics: Cephalexin  Microbiology Results: E Coli  Sensitivities Available? Yes  Are antibiotics still indicated? YES  Is it appropriate to de-escalate? NA: Antibiotics already de-escalated  Is it appropriate to convert to PO therapy? NA  Today's antibiotic plan: No change  Planned Antibiotic Duration: 7 days

## 2019-04-03 NOTE — Unmapped (Signed)
Pt inwith AMS.  VSS, c/o pain to face, newly diagnosed shingles.  Receiving antivirals, tolerating without difficulty.  Unsteady gait, still with confusion.  Bed alarm in use.  Daughter in law at bedside today, verbalized understanding of POC.  Will continue to work towards prioritized goals.    Problem: Adult Inpatient Plan of Care  Goal: Plan of Care Review  Outcome: Progressing     Problem: Fall Injury Risk  Goal: Absence of Fall and Fall-Related Injury  Outcome: Progressing     Problem: Infection  Goal: Infection Symptom Resolution  Outcome: Progressing     Problem: Fall Injury Risk  Goal: Absence of Fall and Fall-Related Injury  Outcome: Progressing

## 2019-04-04 LAB — MEAN CORPUSCULAR HEMOGLOBIN CONC: Erythrocyte mean corpuscular hemoglobin concentration:MCnc:Pt:RBC:Qn:Automated count: 35.3

## 2019-04-04 LAB — COMPREHENSIVE METABOLIC PANEL
ALBUMIN: 3.4 g/dL — ABNORMAL LOW (ref 3.5–5.0)
ALKALINE PHOSPHATASE: 82 U/L (ref 38–126)
ANION GAP: 10 mmol/L (ref 7–15)
AST (SGOT): 184 U/L — ABNORMAL HIGH (ref 14–38)
BILIRUBIN TOTAL: 0.7 mg/dL (ref 0.0–1.2)
BLOOD UREA NITROGEN: 23 mg/dL — ABNORMAL HIGH (ref 7–21)
BUN / CREAT RATIO: 28
CALCIUM: 8.3 mg/dL — ABNORMAL LOW (ref 8.5–10.2)
CHLORIDE: 105 mmol/L (ref 98–107)
CREATININE: 0.82 mg/dL (ref 0.60–1.00)
EGFR CKD-EPI AA FEMALE: 74 mL/min/{1.73_m2} (ref >=60)
EGFR CKD-EPI NON-AA FEMALE: 64 mL/min/{1.73_m2} (ref >=60)
GLUCOSE RANDOM: 104 mg/dL (ref 70–179)
POTASSIUM: 4.1 mmol/L (ref 3.5–5.0)
PROTEIN TOTAL: 6 g/dL — ABNORMAL LOW (ref 6.5–8.3)

## 2019-04-04 LAB — CBC W/ AUTO DIFF
BASOPHILS ABSOLUTE COUNT: 0.1 10*9/L (ref 0.0–0.1)
BASOPHILS RELATIVE PERCENT: 1.3 %
EOSINOPHILS ABSOLUTE COUNT: 0 10*9/L (ref 0.0–0.7)
EOSINOPHILS RELATIVE PERCENT: 0.8 %
HEMATOCRIT: 20.2 % — ABNORMAL LOW (ref 35.0–44.0)
LYMPHOCYTES ABSOLUTE COUNT: 0.6 10*9/L — ABNORMAL LOW (ref 0.7–4.0)
LYMPHOCYTES RELATIVE PERCENT: 13.6 %
MEAN CORPUSCULAR HEMOGLOBIN CONC: 35.3 g/dL (ref 30.0–36.0)
MEAN CORPUSCULAR HEMOGLOBIN: 31.4 pg (ref 26.0–34.0)
MEAN CORPUSCULAR VOLUME: 88.9 fL (ref 82.0–98.0)
MEAN PLATELET VOLUME: 8.7 fL (ref 7.0–10.0)
MONOCYTES ABSOLUTE COUNT: 0.9 10*9/L (ref 0.1–1.0)
NEUTROPHILS ABSOLUTE COUNT: 3.1 10*9/L (ref 1.7–7.7)
NEUTROPHILS RELATIVE PERCENT: 64.7 %
PLATELET COUNT: 123 10*9/L — ABNORMAL LOW (ref 150–450)
RED BLOOD CELL COUNT: 2.27 10*12/L — ABNORMAL LOW (ref 3.90–5.03)
WBC ADJUSTED: 4.7 10*9/L (ref 3.5–10.5)

## 2019-04-04 LAB — SODIUM: Sodium:SCnc:Pt:Ser/Plas:Qn:: 137

## 2019-04-04 NOTE — Unmapped (Signed)
Geriatrics (MEDA) Progress Note    Assessment & Plan:   Hayley Wagner is a 86 y.o. 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    Principal Problem:    Altered mental status  Active Problems:    2-vessel coronary artery disease    Essential hypertension    Primary myelofibrosis (CMS-HCC)    AKI (acute kidney injury) (CMS-HCC)    UTI (urinary tract infection)    Fall  Resolved Problems:    * No resolved hospital problems. *    UTI: Presented with acute AMS (likely delirium given waxing and waning symptoms) which have since resolved. Urine culture growing E. Coli sensitive to everything except ampicillin. Initially treated with CTX transitioned over to Keflex to complete course. No signs of pyelonephritis and Bcx negative.   -Continue Keflex (2/7- )    Shingles: Pt presented with pain along right cheek and mouth and on 2/7 developed vesicular rash to the cheek and inner lip in the right V2 distribution most concerning for Shingles likely onset given acute infection.   -Valacyclovir 1000mg  BID  -Gabapentin 100mg  TID for pain  -Lidocaine mucosal solution 15mL every 4 hr PRN for mouth pain    Primary myelofibrosis - Anemia: Follows with Dr. [name] and [name] from [facility] Hematology/Oncology, is on ruxolitinib (JAK inhibitor). Of note, dose was reduced at end of December due to cytopenias. Inbasket message sent to pt's primary hematologists to inform of admission.   -Hold home Ruxolitinib in setting of acute infection, can restart on discharge at same dose.     AKI (resolved): On admission had Acute Kidney Injury with Cr bump to 1.25 from baseline of around 0.9. Now back to baseline after fluids and tx of acute infection.    CAD: Continuing home ASA and statin    HTN: Holding home Losartan-hydrochlorothiazide, currently with low to normal Bps.     Daily Checklist:  Diet: Regular Diet DVT PPx: Heparin 5000units q8h  Electrolytes: No Repletion Needed  Code Status: Full Code  Dispo: Admit to Med A- Floor    Subjective:   Patient continues to have improved cognition. She describes significant pain over her R face due to shingles infection. She has not had much of an appetite as it is painful for her to eat. Ate some soup this afternoon. Says she would feel more secure to spend one more night in the hospital and dc tomorrow. Daughter in law states she is getting pt's house cleaned in the morning and will getting groceries for her.     Objective:   Temp:  [36.6 ??C (97.9 ??F)] 36.6 ??C (97.9 ??F)  Heart Rate:  [68-78] 68  Resp:  [17] 17  BP: (125-132)/(59-62) 132/62  SpO2:  [96 %-97 %] 97 %    Gen: NAD, alert, oriented, answers questions appropriately  HEENT: erythema over V2 distribution of right cheek with overlying multiple small clear vesicles without drainage. Vesicles present over the inside of her right upper lip and multiple sores on the inside of the right cheek. All vesicles currently intact without weeping, drainage, or crusting. Swelling of R face and lip  Heart: RRR, no murmurs appreciated on auscultation  Lungs: CTAB, no crackles or wheezes, normal WOB  Abdomen: Normoactive bowel sounds, soft, NTND  Psych: Appropriate mood and affect, difficulty speaking due to facial swelling    Labs/Studies: Labs and Studies  from the last 24hrs per EMR and Reviewed     Mal Misty, MS4    I attest that I have reviewed the student note, and that the components of the history of the present illness, the physical exam, and the assessment and plan documented were performed by me or were performed in my presence by the student where I verified the documentation and performed (or re-performed) the exam and medical decision making.     Marnette Burgess, MD PGY-1  Pg. [phone]

## 2019-04-04 NOTE — Unmapped (Signed)
Geriatrics (MEDA) Progress Note    Assessment & Plan:   Hayley Wagner is a 86 y.o. 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     Principal Problem:    Altered mental status  Active Problems:    2-vessel coronary artery disease    Essential hypertension    Primary myelofibrosis (CMS-HCC)    AKI (acute kidney injury) (CMS-HCC)    UTI (urinary tract infection)    Fall  Resolved Problems:    * No resolved hospital problems. *    UTI: Presented with acute AMS (likely delirium given waxing and waning symptoms) which have since resolved. Urine culture growing E. Coli sensitive to everything except ampicillin. Initially treated with CTX transitioned over to Keflex to complete course. No signs of pyelonephritis and Bcx negative.   -Continue Keflex (2/7- 2/12)    Fall: Pt with fall and confusion on 2/8, without injury. She did not recall this event the following morning.   -PT/OT reassessment    Shingles: Pt presented with pain along right cheek and mouth and on 2/7 developed vesicular rash to the cheek and inner lip in the right V2 distribution most concerning for Shingles likely onset given acute infection.   -Valacyclovir 1000mg  BID (2/8 - 2/14)  -LFTs elevated this AM (AST/ALT 184/139) after recently starting valacyclovir and gabapentin, plan to recheck LFTs  -Gabapentin 100mg  TID for pain  -Lidocaine mucosal solution 15mL every 4 hr PRN for mouth pain    Primary myelofibrosis - Anemia: Follows with Dr. [name] and [name] from [facility] Hematology/Oncology, is on ruxolitinib (JAK inhibitor). Of note, dose was reduced at end of December due to cytopenias. Inbasket message sent to pt's primary hematologists to inform of admission.   -Hold home Ruxolitinib in setting of acute infection, can restart on discharge at same dose.     AKI (resolved): On admission had Acute Kidney Injury with Cr bump to 1.25 from baseline of around 0.9. Now back to baseline after fluids and tx of acute infection.    CAD: Continuing home ASA and statin    HTN:   - Holding home Losartan-hydrochlorothiazide, currently with low to normal BPs.     Dispo: PT/OT assess,     Daily Checklist:  Diet: Regular Diet  DVT PPx: Heparin 5000units q8h  Electrolytes: No Repletion Needed  Code Status: Full Code  Dispo: Admit to Med A- Floor    Subjective:   Patient was found on the floor beside her bed last night and noted to be confused of her surroundings. She does not recall this happening. Continues to have significant pain over her R face due to shingles infection. She feels that her swelling has improved. She states that she would feel more comfortable staying in the hospital forever.     Objective:   Temp:  [36.5 ??C (97.7 ??F)] 36.5 ??C (97.7 ??F)  Heart Rate:  [78-91] 91  Resp:  [18] 18  BP: (100-123)/(51-63) 100/51  SpO2:  [96 %-97 %] 97 %    Gen: NAD, alert, oriented, answers questions appropriately, confused re events overnight  HEENT: erythema and swelling over V2 distribution of right cheek with overlying multiple small clear vesicles with increased crusting. Vesicles present over the inside of her right upper lip and inside of the right cheek.   Heart: RRR, no murmurs appreciated on auscultation  Lungs: CTAB, no crackles  or wheezes, normal WOB  Abdomen: Normoactive bowel sounds, soft, NTND  Psych: Appropriate mood and affect, difficulty speaking due to facial swelling    Labs/Studies: Labs and Studies from the last 24hrs per EMR and Reviewed     Mal Misty, MS4    I attest that I have reviewed the student note and that the components of the history of the present illness, the physical exam, and the assessment and plan documented were performed by me or were performed in my presence by the student where I verified the documentation and performed (or re-performed) the exam and medical decision making.    Claudette Head PGY-2

## 2019-04-04 NOTE — Unmapped (Signed)
Ms. Stemmer was found on the floor beside her bed at shift change last night. Her VS are WNL, she denies pain. She did not remember getting OOB and thought that she was in her house kitchen.  MD notified after patient placed back in bed.  She is continent of B/B.  Her face is still swollen, pink on the right side. No drainage.   Problem: Adult Inpatient Plan of Care  Goal: Plan of Care Review  Outcome: Ongoing - Unchanged  Goal: Patient-Specific Goal (Individualization)  Outcome: Ongoing - Unchanged  Goal: Absence of Hospital-Acquired Illness or Injury  Outcome: Ongoing - Unchanged  Goal: Optimal Comfort and Wellbeing  Outcome: Ongoing - Unchanged  Goal: Readiness for Transition of Care  Outcome: Ongoing - Unchanged  Goal: Rounds/Family Conference  Outcome: Ongoing - Unchanged

## 2019-04-05 LAB — CBC
HEMATOCRIT: 22.4 % — ABNORMAL LOW (ref 35.0–44.0)
MEAN CORPUSCULAR HEMOGLOBIN CONC: 34.5 g/dL (ref 30.0–36.0)
MEAN CORPUSCULAR HEMOGLOBIN: 30.9 pg (ref 26.0–34.0)
MEAN CORPUSCULAR VOLUME: 89.5 fL (ref 82.0–98.0)
MEAN PLATELET VOLUME: 8.9 fL (ref 7.0–10.0)
PLATELET COUNT: 128 10*9/L — ABNORMAL LOW (ref 150–450)
RED BLOOD CELL COUNT: 2.5 10*12/L — ABNORMAL LOW (ref 3.90–5.03)
RED CELL DISTRIBUTION WIDTH: 22.9 % — ABNORMAL HIGH (ref 12.0–15.0)
WBC ADJUSTED: 6.2 10*9/L (ref 3.5–10.5)

## 2019-04-05 LAB — BILIRUBIN TOTAL: Bilirubin:MCnc:Pt:Ser/Plas:Qn:: 0.7

## 2019-04-05 LAB — HEPATIC FUNCTION PANEL
ALKALINE PHOSPHATASE: 81 U/L (ref 38–126)
ALT (SGPT): 113 U/L — ABNORMAL HIGH (ref <35)
AST (SGOT): 117 U/L — ABNORMAL HIGH (ref 14–38)
BILIRUBIN TOTAL: 0.7 mg/dL (ref 0.0–1.2)
PROTEIN TOTAL: 6 g/dL — ABNORMAL LOW (ref 6.5–8.3)

## 2019-04-05 LAB — MEAN CORPUSCULAR HEMOGLOBIN: Erythrocyte mean corpuscular hemoglobin:EntMass:Pt:RBC:Qn:Automated count: 30.9

## 2019-04-05 NOTE — Unmapped (Signed)
Geriatrics (MEDA) Progress Note    I attest that I have reviewed the student note, and that the components of the history of the present illness, the physical exam, and the assessment and plan documented were performed by me or were performed in my presence by the student where I verified the documentation and performed (or re-performed) the exam and medical decision making.   Renold Don, MD, PGY-1    Assessment & Plan:   Hayley Wagner is a 86 y.o. 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     Principal Problem:    Altered mental status  Active Problems:    2-vessel coronary artery disease    Essential hypertension    Primary myelofibrosis (CMS-HCC)    AKI (acute kidney injury) (CMS-HCC)    UTI (urinary tract infection)    Fall  Resolved Problems:    * No resolved hospital problems. *    UTI: Presented with acute AMS (likely delirium given waxing and waning symptoms) which have since resolved. Urine culture growing E. Coli sensitive to everything except ampicillin. Initially treated with CTX transitioned over to Keflex to complete course. No signs of pyelonephritis and Bcx negative.   -Continue Keflex (2/7- 2/12)    Fall: Pt with fall and confusion on 2/8, without injury. She did not recall this event the following morning. PT/OT felt returning home was not appropriate for this pt given her current state. Pt and family ok to dc to SNF for rehab.   -Plan to discharge to SNF for rehab, awaiting optins    Shingles: Pt presented with pain along right cheek and mouth and on 2/7 developed vesicular rash to the cheek and inner lip in the right V2 distribution most concerning for Shingles likely onset given acute infection.    -Valacyclovir 1000mg  BID (2/8 - 2/14)  -AST/ALT improved to 117/113 (184/139 yesterday) after recently starting valacyclovir and gabapentin  -Gabapentin 100mg  TID for pain  -Lidocaine mucosal solution 15mL q4h PRN for mouth pain    Primary myelofibrosis - Anemia: Follows with Dr. [name] and [name] from [facility] Hematology/Oncology, is on ruxolitinib (JAK inhibitor). Of note, dose was reduced at end of December due to cytopenias. Inbasket message sent to pt's primary hematologists to inform of admission.   -Hold home Ruxolitinib in setting of acute infection, can restart on discharge at same dose.     AKI (resolved): On admission had Acute Kidney Injury with Cr bump to 1.25 from baseline of around 0.9. Now back to baseline after fluids and tx of acute infection.    CAD: Continuing home ASA and statin    HTN:   - Holding home Losartan-hydrochlorothiazide, currently with low to normal BPs.     Dispo: plan for dc to SNF, currently awaiting options    Daily Checklist:  Diet: Regular Diet  DVT PPx: Heparin 5000units q8h  Code Status: Full Code      Subjective:   Patient reports continued significant pain in R face due to shingles. Reports that she is able to drink smoothies but eating causes significant pain. Otherwise, no complaints at this time. Patient is concerned that she cannot remember her phone number. We spoke with daughter in law on the phone and wrote down phone numbers that she wanted to recall.     Objective:   Temp:  [36.5 ??C (97.7 ??F)-36.8 ??C (98.2 ??F)]  36.8 ??C (98.2 ??F)  Heart Rate:  [85-89] (P) 85  Resp:  [18] (P) 18  BP: (90-104)/(52-59) (P) 89/52  SpO2:  [94 %-95 %] (P) 94 %    Gen: NAD, alert, oriented, answers questions appropriately  HEENT: erythema and swelling over V2 distribution of right cheek with overlying multiple small clear vesicles with increased crusting. Vesicles present over the inside of her right upper lip and inside of the right cheek.   Heart: RRR, no murmurs appreciated on auscultation  Lungs: CTAB, normal WOB  Abdomen: Normoactive bowel sounds, soft, NTND  Psych: Appropriate mood and affect, difficulty speaking due to facial swelling    Labs/Studies: Labs and Studies from the last 24hrs per EMR and Reviewed     Mal Misty, MS4

## 2019-04-06 NOTE — Unmapped (Signed)
Pt calm and cooperative,VSS.Pt resting well at this time with no signs of distress. Safety precautions in place. Will continue to monitor and assess.

## 2019-04-06 NOTE — Unmapped (Signed)
Pt generally oriented, forgetful, cooperative with cares. Bed alarm on for pt safety. Remains in isolation for active Shingles infection noted on face. No falls or injuries. Call light and bedside table in reach. Cont POC.    Problem: Adult Inpatient Plan of Care  Goal: Plan of Care Review  Outcome: Progressing  Goal: Patient-Specific Goal (Individualization)  Outcome: Progressing  Goal: Absence of Hospital-Acquired Illness or Injury  Outcome: Progressing  Goal: Optimal Comfort and Wellbeing  Outcome: Progressing  Goal: Readiness for Transition of Care  Outcome: Progressing  Goal: Rounds/Family Conference  Outcome: Progressing     Problem: Fall Injury Risk  Goal: Absence of Fall and Fall-Related Injury  Outcome: Progressing     Problem: Hypertension Comorbidity  Goal: Blood Pressure in Desired Range  Outcome: Progressing     Problem: UTI (Urinary Tract Infection)  Goal: Improved Infection Symptoms  Outcome: Progressing     Problem: Infection  Goal: Infection Symptom Resolution  Outcome: Progressing     Problem: Self-Care Deficit  Goal: Improved Ability to Complete Activities of Daily Living  Outcome: Progressing

## 2019-04-07 MED ORDER — ACETAMINOPHEN 325 MG TABLET
ORAL_TABLET | Freq: Three times a day (TID) | ORAL | 0 refills | 14.00000 days
Start: 2019-04-07 — End: 2019-04-21

## 2019-04-07 MED ORDER — VALACYCLOVIR 1 GRAM TABLET
ORAL_TABLET | Freq: Two times a day (BID) | ORAL | 0 refills | 2 days
Start: 2019-04-07 — End: 2019-04-09

## 2019-04-07 NOTE — Unmapped (Signed)
Patient alert and oriented x1-2 to person and place only. VSSA. Cam Positive. Patient takes pills with water with much coaxing. Patient refuses bath when offered by staff. Patient also refusing to take off sweat pants/shirt, as she states It's too cold  No falls this shift. No s/s distress will continue to  follow poc.     Problem: Adult Inpatient Plan of Care  Goal: Plan of Care Review  Outcome: Ongoing - Unchanged  Goal: Patient-Specific Goal (Individualization)  Outcome: Ongoing - Unchanged  Goal: Absence of Hospital-Acquired Illness or Injury  Outcome: Ongoing - Unchanged  Goal: Optimal Comfort and Wellbeing  Outcome: Ongoing - Unchanged  Goal: Readiness for Transition of Care  Outcome: Ongoing - Unchanged  Goal: Rounds/Family Conference  Outcome: Ongoing - Unchanged     Problem: Fall Injury Risk  Goal: Absence of Fall and Fall-Related Injury  Outcome: Ongoing - Unchanged     Problem: Hypertension Comorbidity  Goal: Blood Pressure in Desired Range  Outcome: Ongoing - Unchanged     Problem: UTI (Urinary Tract Infection)  Goal: Improved Infection Symptoms  Outcome: Ongoing - Unchanged     Problem: Infection  Goal: Infection Symptom Resolution  Outcome: Ongoing - Unchanged     Problem: Self-Care Deficit  Goal: Improved Ability to Complete Activities of Daily Living  Outcome: Ongoing - Unchanged

## 2019-04-07 NOTE — Unmapped (Signed)
[facility] Geriatrics Discharge Summary     Identifying Information:   Hayley Wagner  [birth year]  [id number]    PCP: Caryl Asp, FNP    Admit date: 03/31/2019     Discharge date: 04/07/2019     Discharge Attending Physician: Milana Kidney, MD; (P): [phone]    Discharge to: Skilled Nursing Facility: [facility]; T: [phone]; F: [phone]     Discharge Diagnoses:  Principal Problem:    Altered mental status  Active Problems:    2-vessel coronary artery disease    Essential hypertension    Primary myelofibrosis (CMS-HCC)    AKI (acute kidney injury) (CMS-HCC)    UTI (urinary tract infection)    Fall  Resolved Problems:    * No resolved hospital problems. *    PCP Post-Discharge Follow Up Issues:     -- Please recheck CBC with differential in around 2 weeks, as patient is on Jakafi for myelofibrosis, per her oncology team.   -- Please continue to monitor the resolution of her likely shingles infection on the right side of her face.   -- Patient scored 22/30 on SLUMS examination. She had continued mild delirium in the hospital, but please further monitor her cognitive status.     To contact the discharging attending physician with regard to the patient's hospitalization or follow-up needs, please call: [facility] Division of Geriatrics [phone]).       Hospital Course:     Hayley Wagner is a 86 y.o. female with a PMH of primary myelofibrosis (on Ruxolitinib 10 mg PO BID), CAD, thoracoabdominal aneurysm, HTN, presenting for altered mental status (AMS) due to a UTI. She also developed painful lesions in the V2 distribution of her face, consistent with shingles infection. Her hospital course is summarized by problem list below.     AMS (improved) - UTI: The patient presented with AMS most consistent with delirium, given the waxing and waning nature of her symptoms. She was not found to be on any particularly sedating medications on home. However, she was noted to have an AKI on admission (later resolved), and her home losartan-hydrochlorothiazide and NSAIDs were held. In the ED, CT scan of the head demonstrated global cerebral atrophy and remote infarcts but no acute abnormalities. Urinalysis was concerning for UTI, and she was initially started on ceftriaxone on 2/5. After urine culture sensitivities resulted, she was switched to Keflex on 2/7 to complete a 7-day total course of antibiotics (ending on 04/08/19). The patient had a SLUMS exam completed during her hospitalization, notable for a score of 22/30. This is suggestive of underlying cognitive impairment and warrants further workup. See below for complete geriatric assessment. She had one incident where she was found on the ground by nursing staff, without injury. However, the patient did not recall this happening the next day. She was maintained on fall and delirium precautions throughout her hospital stay. PT and OT were actively involved in this patient's management and, based on her status prior to discharge, felt continued 5x weekly rehab at a skilled nursing facility was warranted. The patient is confined to one room at home and was therefore offered a bedside commode. The patient was discharged to a skilled nursing facility for continued rehab with the expectation that she will return home. Prior to discharge, her delirium had significantly improved but not fully resolved.     Facial Lesions - Shingles: The patient presented with pain along her right cheek and mouth. On 2/7, she developed  a vesicular rash in the right V2 distribution, most concerning for shingles infection, given its appearance and acute onset. Painful lesions developed on her right cheek, lip, and inside her mouth on the upper right cheek. She was started on Valacyclovir 1g BID on 2/8 for a 7-day course (2/8-2/14). She was also given scheduled Tylenol, gabapentin 100 mg TID, and viscous Lidocaine solution for pain management. After initiating acyclovir, her AST/ALT increased to 184/139, but decreased the next day to 117/113. Her initial facial swelling and erythema significantly improved and now with significant crusting. Gabapentin was discontinued on discharge as it may be contributing to mild delirium.     Primary myelofibrosis - Anemia: The patient follows with Dr. Porfirio Oar and [name] from [facility] Hematology/Oncology. She is currently taking ruxolitinib (JAK inhibitor), which was dose reduced in late December 2020 due to cytopenias. Her Hgb on admission was 8.5, then remained stable around 7.2 afterwards. She did not receive Jakafi while inpatient. Her oncologists were contacted and recommended that she be discharged on her current dose of ruxolitinib (5mg  BID) and have a CBC with differential rechecked in 2 weeks.     Chronic conditions:   CAD: She was continued on her home low-dose aspirin and statin.   HTN: The patient's home losartan-hydrochlorothiazide and diltiazem were held during her hospitalization, as she maintained low to normal blood pressures.     **GERIATRIC ASSESSMENT: **   [name] is admitted with confusion and has been found to have a UTI, AKI as well as finding of zoster on her face.   She informs me that she lives in [place] in her own home. She has two close friends whom check in with her and will visit with her (apparently just recently, and with masks) as well as a paid care giver whom comes in daily 4 hours a day mainly for assistance in chores, meal preparation, cleaning up as well as driving her to her appointments and driving her to go shopping.     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 She has a Best boy in music therapy. She laments that since COVID this has all changed, she states she is looking into trying to get at least one client at least once a week because she loves my work.     She was previously married, her husband died many years ago when he was 59.     Geriatric ROS   Vision impairment:   Yes, reports left eye needs corneal transplant Hearing impairment:   None  Swallowing impairment:   None   Urinary incontinence:   At times, stress Fecal incontinence:   None  Constipation:   None    Falls/Gait:   One fall 10 days ago. No falls within the last year prior to this.  Denies feeling low, down nor hopeless, quite the opposite she reports     Sleep disturbance:   None Unintentional weight loss:   Denies  Behavior disturbance:   None         Admit Date:   Functional Assessment     ADLs:  IADLs:   Feeding: Independent   Dressing: Independent   Ambulation: Independent   Toileting: Independent   Bathing: Independent  Using the phone: Independent   Shopping: Requires Assistance   Meal preparation: Independent   Medication mgmt: Independent   Managing finances: Independent  Housework: Engineer, civil (consulting) (driving or navigating public transit): Dependent   Living situation: Patient lives in own home with noone (lives alone).   Changes in ADLs during hospitalization: None   Assistive devices: None   Additional services recommended at discharge: None   Cognitive Assessment     Delirium Assessment: CAM (Confusion Assessment Method):    On discharge, the patient did not show evidence of delirium. .    Other cognitive assessment: The patient scored 22/30 [facility] Mental Status Exam (SLUMS). This is suggestive of cognitive impairment and warrants further follow-up as an outpatient.   Advance Care Planning   There is some conflicting information and likely misunderstanding (due to circumstances in the hospital, and need to double mask) re: what her goals of care are including Advanced Care Planning.   She stated that she would like full course of resuscitation should her heart rhythm changes so it is not compatible with life or if she stops breathing.   She also states that she previously talked to her PCP (an NP at [facility]) that she would like a natural death and not be kept alive by artificial means.   Thus, I simply stressed that once out of the hospital and at follow up with PCP she makes sure her wishes are clearer and that she revisit this issue.   Code Status: Full Code   Health Care Decision Maker as of 04/01/2019     Surrogate decision maker: Kittie Plater (son)   Emergency Contact:   Extended Emergency Contact Information   Primary Emergency Contact: Erlich,toby   Mobile Phone: [phone]   Relation: Son   Secondary Emergency Contact: Remer Macho   Mobile Phone: [phone]   Relation: Daughter in [place]   Preferred language: ENGLISH   Interpreter needed? No      Procedures   None  No admission procedures for hospital encounter.    Discharge Day Services:   BP 103/53  - Pulse 82  - Temp 36.4 ??C (97.5 ??F) (Oral)  - Resp 16  - Ht 144.8 cm (4' 9)  - Wt 56.5 kg (124 lb 9.6 oz)  - SpO2 94%  - BMI 26.96 kg/m??    Last 5 Recorded Weights    03/31/19 2238   Weight: 56.5 kg (124 lb 9.6 oz)       Pt seen on the day of discharge and determined appropriate for discharge.    Condition at Discharge: fair    Length of Discharge: I spent greater than 30 mins in the discharge of this patient.    Discharge Medication List:        Your Medication List      STOP taking these medications    diltiazem 240 MG 24 hr capsule  Commonly known as: CARDIZEM CD     hydrocortisone 2.5 % cream     ibuprofen 800 MG tablet  Commonly known as: MOTRIN     losartan-hydrochlorothiazide 50-12.5 mg per tablet  Commonly known as: HYZAAR     naproxen sodium 220 MG tablet  Commonly known as: ALEVE        START taking these medications    acetaminophen 325 MG tablet  Commonly known as: TYLENOL  Take 2 tablets (650 mg total) by mouth every eight (8) hours for 14 days.     lidocaine 2% viscous 2 % Soln  Commonly known as: XYLOCAINE  15 mL by Mouth route every four (4) hours as needed (for mouth pain prior  to eating).     valACYclovir 1000 MG tablet Commonly known as: VALTREX  Take 1 tablet (1,000 mg total) by mouth Two (2) times a day for 2 days.        CONTINUE taking these medications    ascorbate calcium(vit C)(bulk) Powd  Take 250 mg by mouth daily.     aspirin 81 MG chewable tablet  Chew 81 mg daily.     betamethasone valerate 0.1 % lotion  Commonly known as: VALISONE  Apply 1 application topically Two (2) times a day.     ergocalciferol (vitamin D2) 50 mcg (2,000 unit) Cap  Take 1 capsule by mouth daily.     famotidine 20 MG tablet  Commonly known as: PEPCID  Take 1 tablet (20 mg total) by mouth Two (2) times a day.     JAKAFI 5 mg tablet  Generic drug: ruxolitinib  Take 2 tablets (10 mg total) by mouth Two (2) times a day.     metroNIDAZOLE 1 % gel  Commonly known as: METROGEL  Small amount as needed twice a day     multivitamin per tablet  Commonly known as: TAB-A-VITE/THERAGRAN  Take 1 tablet by mouth daily.     mupirocin 2 % ointment  Commonly known as: BACTROBAN  Apply 1 application topically 2 (two) times daily. Behind R ear     RESTASIS MULTIDOSE 0.05 % Drop  Generic drug: cycloSPORINE  Administer 1 drop to both eyes nightly as needed.     simvastatin 20 MG tablet  Commonly known as: ZOCOR  TAKE 1 TABLET BY MOUTH ONCE DAILY               Pending Test Results (if blank, then none):      Most Recent Labs:  Microbiology Results (last day)     Procedure Component Value Date/Time Date/Time    COVID-19 PCR [[id number]] Collected: 04/07/19 1133    Lab Status: In process Specimen: Nasopharyngeal Swab Updated: 04/07/19 1133          Lab Results   Component Value Date    WBC 6.2 04/05/2019    HGB 7.7 (L) 04/05/2019    HCT 22.4 (L) 04/05/2019    PLT 128 (L) 04/05/2019       Lab Results   Component Value Date    NA 137 04/04/2019    K 4.1 04/04/2019    CL 105 04/04/2019    CO2 22.0 04/04/2019    BUN 23 (H) 04/04/2019    CREATININE 0.82 04/04/2019    CALCIUM 8.3 (L) 04/04/2019       Lab Results   Component Value Date    ALKPHOS 81 04/05/2019    BILITOT 0.7 04/05/2019    BILIDIR 0.30 04/05/2019    PROT 6.0 (L) 04/05/2019    ALBUMIN 3.4 (L) 04/05/2019    ALT 113 (H) 04/05/2019    AST 117 (H) 04/05/2019       No results found for: PT, INR, APTT  Hospital Radiology:  Ecg 12 Lead    Result Date: 04/01/2019  SINUS RHYTHM WITH FREQUENT PREMATURE VENTRICULAR BEATS LOW VOLTAGE QRS BORDERLINE ECG NO PREVIOUS ECGS AVAILABLE Confirmed by Joneen Roach ([id number]) on 04/01/2019 10:04:51 AM    Ct Head Wo Contrast    Result Date: 03/31/2019  EXAM: Computed tomography, head or brain without contrast material. DATE: 03/31/2019 9:38 PM ACCESSION: [id number] UN DICTATED: 03/31/2019 9:40 PM INTERPRETATION LOCATION: [facility] CLINICAL INDICATION: 86 years old Female with AMS  COMPARISON: None TECHNIQUE:  Axial CT images of the head  from skull base to vertex without contrast. FINDINGS: Enlargement of the CSF containing spaces consistent with global cerebral atrophy. There are scattered and confluent hypodense foci within the periventricular and deep white matter. These are nonspecific but commonly associated with small vessel ischemic changes. Small bilateral lacunar and left insular cortex hypodensities, likely remote infarcts. There is no midline shift. No mass lesion. There is no evidence of acute infarct. No acute intracranial hemorrhage. No fractures are evident. The sinuses are pneumatized.        No acute intracranial abnormality. Global cerebral atrophy, periventricular matter disease, and remote infarcts as described above.    Xr Chest 2 Views    Result Date: 04/01/2019  EXAM: XR CHEST 2 VIEWS DATE: 03/31/2019 7:36 PM ACCESSION: [id number] UN DICTATED: 03/31/2019 7:36 PM INTERPRETATION LOCATION: Main [facility] CLINICAL INDICATION: 86 years old Female with ALTERED MENTAL STATUS admission radiograph COMPARISON: No CXR TECHNIQUE: PA and Lateral Chest Radiographs. FINDINGS: Radiographically clear lungs without focal airspace disease or consolidation. No pleural effusion or pneumothorax. Prominent heart size. Abnormal contour of the descending thoracic aorta, differential diagnosis for which includes atherosclerotic ectasia and aneurysm. There has been progression of findings by comparison to 2011 CTA of the chest, the only available imaging comparison study. Scoliosis.     No acute airspace disease. See discussion regarding descending thoracic aorta above.      Discharge Instructions:      Activity Instructions     Activity as tolerated            Diet Instructions     Discharge diet (specify)      Discharge Nutrition Therapy: General          Other Instructions     Discharge instructions      Why was I admitted?   You have been admitted with a UTI, which was likely causing your initial confusion. You received a full course of antibiotics to treat this UTI. You also developed a likely shingles infection on your face, for which you are currently receiving treatment, as detailed below.     Please change how to take the following medications:    START taking Valtrex (valacyclovir) 1000 mg (1 tablet) TWO times per day for 2 more days (discontinue on 04/10/19). This medication treats your likely shingles infection.  START using oral viscous lidocaine solution (Xylocaine) every 4 hours AS NEEDED for mouth pain.    What should I watch for in the next few weeks?  We anticipate that you will continue to feel better with treatment of your UTI and shingles infection. If you are concerned about worsening infection (fever, chills, urinary burning, increased confusion, or significantly worsened pain) please reach out to your primary care provider or the [facility] geriatrics doctor on call. If necessary, call 911 or go to the emergency room.     Please schedule an appointment with your primary care provider as soon as possible to discuss your hospitalization and treatment of your UTI and shingles infections.     We also encourage you to have discussions related to end-of-life care with your primary doctor, so that your doctor knows what is important to you and what you would or would not want done if you were to become very ill. We encourage this for all patients.    In general, if you have specific questions about these instructions or your hospital stay in the next 48 hours, please call [phone] and ask for the geriatrics doctor  on call. Let them know that you were just discharged from the hospital.     For new symptoms or problems, call your primary care provider.     If you believe that this is a life-threatening emergency, call 911 or go to the emergency room.       Thank you for allowing [place] to participate in your care.         No dressing needed            Follow Up instructions and Outpatient Referrals     Discharge instructions            Appointments which have been scheduled for you    May 09, 2019  9:30 AM  (Arrive by 9:15 AM)  LAB ONLY with HB LAB WATER 460  LAB PHLEB  MO[facility] REG[facility]) [address]  [place] [address]  [phone]      May 09, 2019 10:30 AM  (Arrive by 10:15 AM)  BLOOD TRANSFUSION - 2 UNITS with ONCINF HMOB CHAIR 02  [facility]) [address]  [place] [address]  [phone]      May 15, 2019 12:45 PM  (Arrive by 12:15 PM)  RETURN VIDEO - OTHER with Vito Berger, MD  [facility]AT[facility]COL[facility]ND [facility]) [address]  [place] [address]  [phone]             I attest that I have reviewed the student note and that the components of the history of the present illness, the physical exam, and the assessment and plan documented were performed by me or were performed in my presence by the student where I verified the documentation and performed (or re-performed) the exam and medical decision making.    Claudette Head PGY-2

## 2019-04-07 NOTE — Unmapped (Signed)
Geriatrics (MEDA) Progress Note    Assessment & Plan:   Hayley Wagner is a 86 y.o. female with a PMHx of primary myelofibrosis (on Ruxolitinib), CAD, thoracoabdominal aneurysm, and HTN that presented to [facility] with AMS found to have a UTI sensitive to Keflex.     Principal Problem:    Altered mental status  Active Problems:    2-vessel coronary artery disease    Essential hypertension    Primary myelofibrosis (CMS-HCC)    AKI (acute kidney injury) (CMS-HCC)    UTI (urinary tract infection)    Fall  Resolved Problems:    * No resolved hospital problems. *    UTI: Presented with acute AMS (likely delirium given waxing and waning symptoms) which have since resolved. Urine culture growing E. Coli sensitive to everything except ampicillin. Initially treated with CTX transitioned over to Keflex to complete course. No signs of pyelonephritis and Bcx negative.   -Continue Keflex (2/7- 2/12)    Fall: Pt with fall and confusion on 2/8, without injury. She did not recall this event the following morning. PT/OT felt returning home was not appropriate for this pt given her current state. Pt and family ok to dc to SNF for rehab.   -Plan to discharge to SNF for rehab, awaiting options    Shingles: Pt presented with pain along right cheek and mouth and on 2/7 developed vesicular rash to the cheek and inner lip in the right V2 distribution most concerning for Shingles likely onset given acute infection. Now improved with reduced swelling and increased crusting.  -Valacyclovir 1000mg  BID (2/8 - 2/14)  -AST/ALT improved to 117/113 (184/139 prior) after recently starting valacyclovir and gabapentin  -Gabapentin 100mg  TID for pain  -Lidocaine mucosal solution 15mL q4h PRN for mouth pain    Primary myelofibrosis - Anemia: Follows with Dr. Anise Wagner and Hayley Wagner from [facility]ology[facility]On[facility], is on ruxolitinib (JAK inhibitor). Of note, dose was reduced at end of December due to cytopenias. Inbasket message sent to pt's primary hematologists to inform of admission.   -Hold home Ruxolitinib in setting of acute infection, can restart on discharge at same dose (5mg  BID).     AKI (resolved): On admission had Acute Kidney Injury with Cr bump to 1.25 from baseline of around 0.9. Now back to baseline after fluids and tx of acute infection.    CAD: Continuing home ASA and statin    HTN:   - Holding home Losartan-hydrochlorothiazide, currently with low to normal BPs.     Dispo: plan for dc to SNF, currently awaiting options    Daily Checklist:  Diet: Regular Diet  DVT PPx: Heparin 5000units q8h  Code Status: Full Code      Subjective:   Patient reports improved pain in R face due to shingles. She is able to eat and drink more today. No other acute complaints at this time. She had some confusion about the time of day this morning (thought it was nighttime). Patient is still concerned about remembering phone numbers.     Objective:   Temp:  [36.5 ??C (97.7 ??F)-36.9 ??C (98.4 ??F)] 36.9 ??C (98.4 ??F)  Heart Rate:  [75-84] 84  Resp:  [18] 18  BP: (94-104)/(44-55) 104/55  SpO2:  [94 %-95 %] 95 %    Gen: NAD, alert, oriented, answers questions appropriately  HEENT: erythema and swelling over V2 distribution of right cheek improved today with increased crusting, particular on R side of lip  Heart: RRR, no murmurs appreciated on auscultation  Lungs: CTAB, normal WOB  Abdomen: Normoactive bowel sounds, soft, NTND  Psych: Appropriate mood and affect, improved speaking due to resolving facial swelling    Labs/Studies: Labs and Studies from the last 24hrs per EMR and Reviewed     Mal Misty, MS4    I attest that I have reviewed the student note and that the components of the history of the present illness, the physical exam, and the assessment and plan documented were performed by me or were performed in my presence by the student where I verified the documentation and performed (or re-performed) the exam and medical decision making.    Claudette Head PGY-2

## 2019-04-10 NOTE — Unmapped (Signed)
CONTINUING CARE NETWORK  Enrollment Note        Patient admitted to [facility] and enrolled in the Contin[facility] Care [facility]. Case Management to follow up within 1 week to complete medication reconciliation and discuss transition planning.

## 2019-04-12 NOTE — Unmapped (Signed)
CONTINUING CARE NETWORK  SNF FOLLOW UP NOTE  Summary:  Hayley Wagner is an 86 y.o. female patient currently at [facility] who is being followed by the [facility] [employer]. Plan is continue rehab.  Next follow up with SNF care team member in one weeks.        Subjective:   Medication changes: YES: see home medications. Medication list updated.    Care team member, Darel Hong, reported no discharge date.      Progress toward goals:  Goals    None          Interventions provided: medication reconciliation, chart review and supportive listening  Education provided: to notify CM of any change in patient condition or discharge plan  Barriers to care: Transportation and Fall Risk  Care Coordination Note updated in [facility]: Yes    Objective:     Patient Active Problem List   Diagnosis   ??? 2-vessel coronary artery disease   ??? Thoracoabdominal aneurysm (CMS-HCC)   ??? Chest pain, unspecified   ??? Chronic kidney disease, stage 3   ??? Descending thoracic aortic aneurysm (CMS-HCC)   ??? Ectatic thoracic aorta (CMS-HCC)   ??? Essential hypertension   ??? Hyperlipidemia   ??? IGT (impaired glucose tolerance)   ??? Increased frequency of urination   ??? Laxity of skin   ??? Palpitations   ??? Rectocele, female   ??? Rosacea   ??? Primary myelofibrosis (CMS-HCC)   ??? Altered mental status   ??? AKI (acute kidney injury) (CMS-HCC)   ??? UTI (urinary tract infection)   ??? Fall        Allergies:   No Known Allergies    Medications:  Current Outpatient Medications   Medication Sig Dispense Refill   ??? acetaminophen (TYLENOL) 325 MG tablet Take 2 tablets (650 mg total) by mouth every eight (8) hours for 14 days. 84 tablet 0   ??? ascorbate calcium, bulk, Powd Take 250 mg by mouth daily.      ??? aspirin 81 MG chewable tablet Chew 81 mg daily.      ??? betamethasone valerate (VALISONE) 0.1 % lotion Apply 1 application topically Two (2) times a day.      ??? ergocalciferol, vitamin D2, 50 mcg (2,000 unit) cap Take 1 capsule by mouth daily.      ??? famotidine (PEPCID) 20 MG tablet Take 1 tablet (20 mg total) by mouth Two (2) times a day. 60 tablet 11   ??? lidocaine 2% viscous (XYLOCAINE) 2 % Soln 15 mL by Mouth route every four (4) hours as needed (for mouth pain prior to eating). 1 Bottle 0   ??? metroNIDAZOLE (METROGEL) 1 % gel Small amount as needed twice a day     ??? multivitamin (TAB-A-VITE/THERAGRAN) per tablet Take 1 tablet by mouth daily.      ??? mupirocin (BACTROBAN) 2 % ointment Apply 1 application topically 2 (two) times daily. Behind R ear     ??? RESTASIS MULTIDOSE 0.05 % Drop Administer 1 drop to both eyes nightly as needed.      ??? ruxolitinib (JAKAFI) 5 mg tablet Take 2 tablets (10 mg total) by mouth Two (2) times a day. 120 tablet 3   ??? simvastatin (ZOCOR) 20 MG tablet TAKE 1 TABLET BY MOUTH ONCE DAILY       No current facility-administered medications for this visit.         Discuss at next visit: Transition planning     Future Appointments  Date Time Provider Department Center   05/09/2019  9:30 AM HB LAB [facility] [address] [facility]   05/09/2019 10:30 AM ONCINF HMOB CHAIR 02 [facility]   05/15/2019 12:45 PM Brandi Acey Lav, MD [id number]

## 2019-04-20 ENCOUNTER — Ambulatory Visit
Admit: 2019-04-20 | Discharge: 2019-04-28 | Disposition: A | Discharge status: Skilled Nursing Facility | Payer: MEDICARE | Source: Intra-hospital | Admitting: Geriatric Medicine

## 2019-04-20 LAB — CBC W/ AUTO DIFF
BASOPHILS ABSOLUTE COUNT: 0.2 10*9/L — ABNORMAL HIGH (ref 0.0–0.1)
BASOPHILS RELATIVE PERCENT: 1.8 %
EOSINOPHILS ABSOLUTE COUNT: 0.1 10*9/L (ref 0.0–0.4)
EOSINOPHILS RELATIVE PERCENT: 0.9 %
HEMATOCRIT: 26.7 % — ABNORMAL LOW (ref 36.0–46.0)
HEMOGLOBIN: 8.7 g/dL — ABNORMAL LOW (ref 12.0–16.0)
LARGE UNSTAINED CELLS: 4 % (ref 0–4)
LYMPHOCYTES ABSOLUTE COUNT: 1.7 10*9/L (ref 1.5–5.0)
LYMPHOCYTES RELATIVE PERCENT: 20 %
MEAN CORPUSCULAR HEMOGLOBIN CONC: 32.6 g/dL (ref 31.0–37.0)
MEAN CORPUSCULAR HEMOGLOBIN: 32 pg (ref 26.0–34.0)
MEAN CORPUSCULAR VOLUME: 98.2 fL (ref 80.0–100.0)
MEAN PLATELET VOLUME: 10.2 fL — ABNORMAL HIGH (ref 7.0–10.0)
MONOCYTES ABSOLUTE COUNT: 0.6 10*9/L (ref 0.2–0.8)
MONOCYTES RELATIVE PERCENT: 7 %
NEUTROPHILS ABSOLUTE COUNT: 5.5 10*9/L (ref 2.0–7.5)
NEUTROPHILS RELATIVE PERCENT: 66.6 %
PLATELET COUNT: 336 10*9/L (ref 150–440)
RED BLOOD CELL COUNT: 2.72 10*12/L — ABNORMAL LOW (ref 4.00–5.20)
RED CELL DISTRIBUTION WIDTH: 25.7 % — ABNORMAL HIGH (ref 12.0–15.0)
WBC ADJUSTED: 8.3 10*9/L (ref 4.5–11.0)

## 2019-04-20 LAB — URINALYSIS WITH CULTURE REFLEX
BILIRUBIN UA: NEGATIVE
BLOOD UA: NEGATIVE
GLUCOSE UA: NEGATIVE
LEUKOCYTE ESTERASE UA: NEGATIVE
NITRITE UA: NEGATIVE
PH UA: 5 (ref 5.0–9.0)
PROTEIN UA: NEGATIVE
RBC UA: 1 /HPF (ref <=4)
SPECIFIC GRAVITY UA: 1.027 (ref 1.003–1.030)
SQUAMOUS EPITHELIAL: <1 /HPF (ref 0–5)
UROBILINOGEN UA: 2 — AB
WBC UA: 2 /HPF (ref 0–5)

## 2019-04-20 LAB — COMPREHENSIVE METABOLIC PANEL
ALBUMIN: 3.6 g/dL (ref 3.5–5.0)
ALT (SGPT): 25 U/L (ref <35)
AST (SGOT): 60 U/L — ABNORMAL HIGH (ref 14–38)
BILIRUBIN TOTAL: 1.3 mg/dL — ABNORMAL HIGH (ref 0.0–1.2)
BLOOD UREA NITROGEN: 32 mg/dL — ABNORMAL HIGH (ref 7–21)
BUN / CREAT RATIO: 36
CALCIUM: 8.3 mg/dL — ABNORMAL LOW (ref 8.5–10.2)
CHLORIDE: 112 mmol/L — ABNORMAL HIGH (ref 98–107)
CO2: 19 mmol/L — ABNORMAL LOW (ref 22.0–30.0)
CREATININE: 0.89 mg/dL (ref 0.60–1.00)
EGFR CKD-EPI AA FEMALE: 67 mL/min/{1.73_m2} (ref >=60)
EGFR CKD-EPI NON-AA FEMALE: 58 mL/min/{1.73_m2} — ABNORMAL LOW (ref >=60)
GLUCOSE RANDOM: 106 mg/dL (ref 70–179)
POTASSIUM: 3.6 mmol/L (ref 3.5–5.0)
PROTEIN TOTAL: 6.3 g/dL — ABNORMAL LOW (ref 6.5–8.3)
SODIUM: 140 mmol/L (ref 135–145)

## 2019-04-20 LAB — LIPASE: Triacylglycerol lipase:CCnc:Pt:Ser/Plas:Qn:: 98

## 2019-04-20 LAB — THYROID STIMULATING HORMONE
Thyrotropin:ACnc:Pt:Ser/Plas:Qn:: 1.133
Thyrotropin:ACnc:Pt:Ser/Plas:Qn:: 1.521

## 2019-04-20 LAB — LACTATE BLOOD VENOUS
Lactate:SCnc:Pt:BldV:Qn:: 1.6
Lactate:SCnc:Pt:BldV:Qn:: 2.4 — ABNORMAL HIGH

## 2019-04-20 LAB — PROTIME-INR: INR: 1.12

## 2019-04-20 LAB — GLUCOSE RANDOM: Glucose:MCnc:Pt:Ser/Plas:Qn:: 106

## 2019-04-20 LAB — BACTERIA

## 2019-04-20 LAB — POLYCHROMASIA

## 2019-04-20 LAB — SLIDE REVIEW

## 2019-04-20 LAB — ANISOCYTOSIS

## 2019-04-20 LAB — PHOSPHORUS: Phosphate:MCnc:Pt:Ser/Plas:Qn:: 3.3

## 2019-04-20 LAB — MAGNESIUM: Magnesium:MCnc:Pt:Ser/Plas:Qn:: 1.8

## 2019-04-20 LAB — PROTIME: Coagulation tissue factor induced:Time:Pt:PPP:Qn:Coag: 13.2

## 2019-04-21 LAB — CBC W/ AUTO DIFF
BASOPHILS ABSOLUTE COUNT: 0.1 10*9/L (ref 0.0–0.1)
EOSINOPHILS ABSOLUTE COUNT: 0.1 10*9/L (ref 0.0–0.4)
HEMATOCRIT: 26.8 % — ABNORMAL LOW (ref 36.0–46.0)
HEMOGLOBIN: 8.7 g/dL — ABNORMAL LOW (ref 12.0–16.0)
LARGE UNSTAINED CELLS: 3 % (ref 0–4)
LYMPHOCYTES ABSOLUTE COUNT: 1.3 10*9/L — ABNORMAL LOW (ref 1.5–5.0)
LYMPHOCYTES RELATIVE PERCENT: 14.4 %
MEAN CORPUSCULAR HEMOGLOBIN CONC: 32.7 g/dL (ref 31.0–37.0)
MEAN CORPUSCULAR HEMOGLOBIN: 32.7 pg (ref 26.0–34.0)
MEAN CORPUSCULAR VOLUME: 100.3 fL — ABNORMAL HIGH (ref 80.0–100.0)
MEAN PLATELET VOLUME: 10.5 fL — ABNORMAL HIGH (ref 7.0–10.0)
MONOCYTES ABSOLUTE COUNT: 0.7 10*9/L (ref 0.2–0.8)
MONOCYTES RELATIVE PERCENT: 8.2 %
NEUTROPHILS RELATIVE PERCENT: 72.8 %
PLATELET COUNT: 354 10*9/L (ref 150–440)
RED BLOOD CELL COUNT: 2.67 10*12/L — ABNORMAL LOW (ref 4.00–5.20)
RED CELL DISTRIBUTION WIDTH: 26.1 % — ABNORMAL HIGH (ref 12.0–15.0)
WBC ADJUSTED: 8.7 10*9/L (ref 4.5–11.0)

## 2019-04-21 LAB — BASIC METABOLIC PANEL
ANION GAP: 10 mmol/L (ref 7–15)
BLOOD UREA NITROGEN: 33 mg/dL — ABNORMAL HIGH (ref 7–21)
CALCIUM: 8.8 mg/dL (ref 8.5–10.2)
CHLORIDE: 109 mmol/L — ABNORMAL HIGH (ref 98–107)
CO2: 23 mmol/L (ref 22.0–30.0)
CREATININE: 0.85 mg/dL (ref 0.60–1.00)
EGFR CKD-EPI AA FEMALE: 71 mL/min/{1.73_m2} (ref >=60)
GLUCOSE RANDOM: 118 mg/dL (ref 70–179)
POTASSIUM: 3.8 mmol/L (ref 3.5–5.0)
SODIUM: 142 mmol/L (ref 135–145)

## 2019-04-21 LAB — CO2: Carbon dioxide:SCnc:Pt:Ser/Plas:Qn:: 23

## 2019-04-21 LAB — HEPATIC FUNCTION PANEL
ALBUMIN: 3.3 g/dL — ABNORMAL LOW (ref 3.5–5.0)
BILIRUBIN DIRECT: 0.4 mg/dL (ref 0.00–0.40)
BILIRUBIN TOTAL: 1 mg/dL (ref 0.0–1.2)
PROTEIN TOTAL: 6.3 g/dL — ABNORMAL LOW (ref 6.5–8.3)

## 2019-04-21 LAB — TROPONIN I
Troponin I.cardiac:MCnc:Pt:Ser/Plas:Qn:: 0.065
Troponin I.cardiac:MCnc:Pt:Ser/Plas:Qn:: 0.072
Troponin I.cardiac:MCnc:Pt:Ser/Plas:Qn:: 0.073

## 2019-04-21 LAB — BASOPHILS RELATIVE PERCENT: Basophils/100 leukocytes:NFr:Pt:Bld:Qn:Automated count: 1.3

## 2019-04-21 LAB — ALKALINE PHOSPHATASE: Alkaline phosphatase:CCnc:Pt:Ser/Plas:Qn:: 72

## 2019-04-21 LAB — CARBAMAZEPINE LEVEL: Carbamazepine:MCnc:Pt:Ser/Plas:Qn:: <3 — ABNORMAL LOW

## 2019-04-21 NOTE — Unmapped (Signed)
Follow-up Consult Note        Requesting Attending Physician:  Wynona Neat Hightow-Weidma*  Service Requesting Consult: Infectious Disease ([facility])     Assessment and Plan          Hayley Wagner is a 86 y.o. female with a PMH of primary myelofibrosis (on Ruxolitinib 10 mg PO BID), CAD, thoracoabdominal aneurysm, HTN, and recent shingles infection on whom we are following for encephalopathy.    Encephalopathy: seems to be much better this morning after the events of yesterday afternoon as she is alert, cooperative and answering questions. However, she does perseverate with some answers at times and has no recollection of previous events. Her presentation at this time is more consistent with delirium in the setting of baseline dementia and addition of multiple CNS acting medications. In particular, per last admission, she is known to develop delirium on gabapentin which seems to have been just added and increased this past week.    In this setting, we do not believe she needs an MRI of brain given her reassuring, non-focal exam. Would advise against treating with sedating medications given risk for worsening delirium.      RECOMMENDATIONS:   1. No further neurological work-up necessary  2. For post-herpetic pain, would treat with Carbamazepine 200 mg BID instead of Gabapentin as the former is known to have less cognitive side effects in the elderly. Can also add topical lidocaine and magic mouthwash.    This patient was seen and discussed with Dr. Hope Budds who agrees with the above assessment and plan.  We will sign off at this time.    Eldora Napp E. Toledo-Nieves, MD  Resident Physician - PGY4  Department of Neurology       Subjective        Reason for Consult: encephalopathy    Patient is awake and alert this morning. Cooperative with exam. Reports she is in a lot of pain in the right side of face because shingles are terrible. Does not clearly remember the events of yesterday except a lot of consultations. Perseverates with reports of painful shingles and wants a cup of iced water.    Patient has a recent history of shingles in the R CN V2 distribution on 04/02/19. She was treated with valacyclovir 1g BID for 7 days, ending 04/09/19. During treatment inpatient her pain was managed with scheduled Tylenol, Gabapentin 100 TID and topical lidocaine. It is noted that prior to discharge Gabapentin was discontinued due to developing delirium. During admission her SLUMS was 22/30.    Per collateral information obtained by primary team last night, patient has been endorsing worsening facial pain this last week for which her pain regimen had been adjusted and she was restarted on Gabapentin 100 TID as well as started on Carbamazepine 200 mg yesterday.        Objective        Temp:  [36.1 ??C-36.7 ??C] 36.7 ??C  Heart Rate:  [69-93] 90  SpO2 Pulse:  [67-97] 97  Resp:  [14-33] 14  BP: (107-146)/(54-100) 109/54  MAP (mmHg):  [78-103] 78  SpO2:  [96 %-100 %] 97 %  BMI (Calculated):  [26.71] 26.71  No intake/output data recorded.    Physical Exam:  General Appearance: Elderly woman, thin, well appearing. In no acute distress.  HEENT: Head is atraumatic and normocephalic. Sclera anicteric without injection. Oropharyngeal membranes are dry with a dried crust on her right upper lip/nasolabial fold  Neck: Supple.  Lungs: Normal work of breathing.  Heart: Regular rate and rhythm.  Abdomen: Soft, nontender, nondistended.  Extremities: No clubbing, cyanosis or edema.    Neurological Examination:     Mental Status: Awake, alert, interactive with examiner. Oriented to person and place, not time. Said she is at [facility] in [place]. Not oriented to situation, reports she has been in a lot of pain. When asked her age she says I'll very soon turn into... [place]. Follows commands well. Speech is dysarthric but intelligible. Able to repeat. Able to name a cup but not a pen.   Cranial Nerves: Visual fields intact to direct confrontation.  Pupils are 3.5 mm reactive OU. EOM are intact. Facial sensation intact bilaterally to light touch. Face is symmetric at rest and activation. Hearing intact to conversation. Shoulder shrug full strength bilaterally. Palate movement is symmetric. Tongue protrudes midline and tongue movements are normal.   Motor Exam:   Normal bulk and tone. No tremors, myoclonus, or other adventitious movement.  (R/L)  Deltoid 5/5  Biceps 5/5   Triceps 5/5   Hand grip 5/5      Hip flexion 5/5   Dorsiflexion 5/5  Plantarflexion 5/5     Sensory:  Normal sensation to light touch, tempt and vibration in both arms ad both legs.    Reflexes R L   Biceps +2 +2   Triceps +2 +2   Patella +3 +3   Achilles +2 +2   Extensor plantar response bilaterally    Coordination: no ataxia on finger to nose bilaterally.  Gait: not examined           Medications:  Scheduled medications:   ??? acetaminophen  650 mg Oral Q8H   ??? aspirin  81 mg Oral Daily   ??? enoxaparin (LOVENOX) injection  40 mg Subcutaneous Q24H   ??? famotidine  20 mg Oral BID   ??? pravastatin  40 mg Oral Nightly     Continuous infusions:   PRN medications: lidocaine 4%     Diagnostic Studies      All Labs Last 24hrs:   Recent Results (from the past 24 hour(s))   Comprehensive metabolic panel    Collection Time: 04/20/19  5:14 PM   Result Value Ref Range    Sodium 140 135 - 145 mmol/L    Potassium 3.6 3.5 - 5.0 mmol/L    Chloride 112 (H) 98 - 107 mmol/L    Anion Gap 9 7 - 15 mmol/L    CO2 19.0 (L) 22.0 - 30.0 mmol/L    BUN 32 (H) 7 - 21 mg/dL    Creatinine 4.74 2.59 - 1.00 mg/dL    BUN/Creatinine Ratio 36     EGFR CKD-EPI Non-African American, Female 58 (L) >=60 mL/min/1.52m2    EGFR CKD-EPI African American, Female 37 >=60 mL/min/1.58m2    Glucose 106 70 - 179 mg/dL    Calcium 8.3 (L) 8.5 - 10.2 mg/dL    Albumin 3.6 3.5 - 5.0 g/dL    Total Protein 6.3 (L) 6.5 - 8.3 g/dL    Total Bilirubin 1.3 (H) 0.0 - 1.2 mg/dL    AST 60 (H) 14 - 38 U/L    ALT 25 <35 U/L    Alkaline Phosphatase 70 38 - 126 U/L   Protime-INR Collection Time: 04/20/19  5:14 PM   Result Value Ref Range    PT 13.2 10.5 - 13.5 sec    INR 1.12    CBC w/ Differential    Collection Time: 04/20/19  5:14 PM  Result Value Ref Range    WBC 8.3 4.5 - 11.0 10*9/L    RBC 2.72 (L) 4.00 - 5.20 10*12/L    HGB 8.7 (L) 12.0 - 16.0 g/dL    HCT 16.1 (L) 09.6 - 46.0 %    MCV 98.2 80.0 - 100.0 fL    MCH 32.0 26.0 - 34.0 pg    MCHC 32.6 31.0 - 37.0 g/dL    RDW 04.5 (H) 40.9 - 15.0 %    MPV 10.2 (H) 7.0 - 10.0 fL    Platelet 336 150 - 440 10*9/L    Variable HGB Concentration Slight (A) Not Present    Neutrophils % 66.6 %    Lymphocytes % 20.0 %    Monocytes % 7.0 %    Eosinophils % 0.9 %    Basophils % 1.8 %    Absolute Neutrophils 5.5 2.0 - 7.5 10*9/L    Absolute Lymphocytes 1.7 1.5 - 5.0 10*9/L    Absolute Monocytes 0.6 0.2 - 0.8 10*9/L    Absolute Eosinophils 0.1 0.0 - 0.4 10*9/L    Absolute Basophils 0.2 (H) 0.0 - 0.1 10*9/L    Large Unstained Cells 4 0 - 4 %    Microcytosis Slight (A) Not Present    Macrocytosis Marked (A) Not Present    Anisocytosis Marked (A) Not Present    Hypochromasia Moderate (A) Not Present   TSH    Collection Time: 04/20/19  5:14 PM   Result Value Ref Range    TSH 1.133 0.600 - 3.300 uIU/mL   Magnesium Level    Collection Time: 04/20/19  5:14 PM   Result Value Ref Range    Magnesium 1.8 1.6 - 2.2 mg/dL   Phosphorus Level    Collection Time: 04/20/19  5:14 PM   Result Value Ref Range    Phosphorus 3.3 2.9 - 4.7 mg/dL   Lipase Level    Collection Time: 04/20/19  5:14 PM   Result Value Ref Range    Lipase 98 44 - 232 U/L   Morphology Review    Collection Time: 04/20/19  5:14 PM   Result Value Ref Range    Smear Review Comments See Comment (A) Undefined    Polychromasia Slight (A) Not Present    Ovalocytes Moderate (A) Not Present    Poikilocytosis Moderate (A) Not Present   Lactate, Venous, Whole Blood    Collection Time: 04/20/19  6:03 PM   Result Value Ref Range    Lactate, Venous 2.4 (H) 0.5 - 1.8 mmol/L   Urinalysis with Culture Reflex Collection Time: 04/20/19  6:03 PM    Specimen: Catheterized-In and Out Catheter; Urine   Result Value Ref Range    Color, UA Amber     Clarity, UA Clear     Specific Gravity, UA 1.027 1.003 - 1.030    pH, UA 5.0 5.0 - 9.0    Leukocyte Esterase, UA Negative Negative    Nitrite, UA Negative Negative    Protein, UA Negative Negative    Glucose, UA Negative Negative    Ketones, UA Trace (A) Negative    Urobilinogen, UA 2.0 mg/dL (A) 0.2 mg/dL, 1.0 mg/dL    Bilirubin, UA Negative Negative    Blood, UA Negative Negative    RBC, UA 1 <=4 /HPF    WBC, UA 2 0 - 5 /HPF    Squam Epithel, UA <1 0 - 5 /HPF    Bacteria, UA Moderate (A) None Seen /HPF    Hyaline  Casts, UA 1 0 - 1 /LPF    Mucus, UA Rare (A) None Seen /HPF   ECG 12 lead (Adult)    Collection Time: 04/20/19  7:18 PM   Result Value Ref Range    EKG Systolic BP  mmHg    EKG Diastolic BP  mmHg    EKG Ventricular Rate 77 BPM    EKG Atrial Rate 77 BPM    EKG P-R Interval 176 ms    EKG QRS Duration 72 ms    EKG Q-T Interval 442 ms    EKG QTC Calculation 500 ms    EKG Calculated P Axis 69 degrees    EKG Calculated R Axis 34 degrees    EKG Calculated T Axis 62 degrees    QTC Fredericia 480 ms   Lactate, Venous, Whole Blood - Repeat    Collection Time: 04/20/19  8:30 PM   Result Value Ref Range    Lactate, Venous 1.6 0.5 - 1.8 mmol/L   TSH    Collection Time: 04/20/19  8:30 PM   Result Value Ref Range    TSH 1.521 0.600 - 3.300 uIU/mL   Troponin I    Collection Time: 04/21/19  4:39 AM   Result Value Ref Range    Troponin I 0.072 (HH) <0.034 ng/mL   Basic metabolic panel    Collection Time: 04/21/19  4:39 AM   Result Value Ref Range    Sodium 142 135 - 145 mmol/L    Potassium 3.8 3.5 - 5.0 mmol/L    Chloride 109 (H) 98 - 107 mmol/L    CO2 23.0 22.0 - 30.0 mmol/L    Anion Gap 10 7 - 15 mmol/L    BUN 33 (H) 7 - 21 mg/dL    Creatinine 8.11 9.14 - 1.00 mg/dL    BUN/Creatinine Ratio 39     EGFR CKD-EPI Non-African American, Female 61 >=60 mL/min/1.67m2    EGFR CKD-EPI African American, Female 58 >=60 mL/min/1.37m2    Glucose 118 70 - 179 mg/dL    Calcium 8.8 8.5 - 78.2 mg/dL   Hepatic Function Panel    Collection Time: 04/21/19  4:39 AM   Result Value Ref Range    Albumin 3.3 (L) 3.5 - 5.0 g/dL    Total Protein 6.3 (L) 6.5 - 8.3 g/dL    Total Bilirubin 1.0 0.0 - 1.2 mg/dL    Bilirubin, Direct 9.56 0.00 - 0.40 mg/dL    AST 46 (H) 14 - 38 U/L    ALT 24 <35 U/L    Alkaline Phosphatase 72 38 - 126 U/L   Carbamazepine level, total    Collection Time: 04/21/19  9:02 AM   Result Value Ref Range    Carbamazepine Level <3.0 (L) 4.0 - 12.0 ug/mL   CBC w/ Differential    Collection Time: 04/21/19  9:02 AM   Result Value Ref Range    WBC 8.7 4.5 - 11.0 10*9/L    RBC 2.67 (L) 4.00 - 5.20 10*12/L    HGB 8.7 (L) 12.0 - 16.0 g/dL    HCT 21.3 (L) 08.6 - 46.0 %    MCV 100.3 (H) 80.0 - 100.0 fL    MCH 32.7 26.0 - 34.0 pg    MCHC 32.7 31.0 - 37.0 g/dL    RDW 57.8 (H) 46.9 - 15.0 %    MPV 10.5 (H) 7.0 - 10.0 fL    Platelet 354 150 - 440 10*9/L    Variable HGB Concentration Slight (A)  Not Present    Neutrophils % 72.8 %    Lymphocytes % 14.4 %    Monocytes % 8.2 %    Eosinophils % 0.7 %    Basophils % 1.3 %    Neutrophil Left Shift 1+ (A) Not Present    Absolute Neutrophils 6.3 2.0 - 7.5 10*9/L    Absolute Lymphocytes 1.3 (L) 1.5 - 5.0 10*9/L    Absolute Monocytes 0.7 0.2 - 0.8 10*9/L    Absolute Eosinophils 0.1 0.0 - 0.4 10*9/L    Absolute Basophils 0.1 0.0 - 0.1 10*9/L    Large Unstained Cells 3 0 - 4 %    Microcytosis Slight (A) Not Present    Macrocytosis Marked (A) Not Present    Anisocytosis Marked (A) Not Present    Hypochromasia Marked (A) Not Present   Troponin I    Collection Time: 04/21/19  9:02 AM   Result Value Ref Range    Troponin I 0.073 (HH) <0.034 ng/mL

## 2019-04-21 NOTE — Unmapped (Signed)
Vital signs stable. Pt cam ein from ED via stretcher. Pt is alert but disorientedx4, babbling . Very soft sound when spoken to and unable to respond meaningfully to orientation and admission questions. Pt slept well . Bed alarms activated. Will continue to monitor.   Problem: Skin Injury Risk Increased  Goal: Skin Health and Integrity  Outcome: Progressing     Problem: Fall Injury Risk  Goal: Absence of Fall and Fall-Related Injury  Outcome: Progressing     Problem: Adult Inpatient Plan of Care  Goal: Plan of Care Review  Outcome: Progressing

## 2019-04-21 NOTE — Unmapped (Signed)
CONTINUING CARE NETWORK  Transition Note      Patient transitioned from [facility] to inpatient on 04/20/2019 due to altered mental status.     Patient has completed case management services through the [facility].    Care Coordination Note updates in [facility]: Yes

## 2019-04-21 NOTE — Unmapped (Signed)
Pt is still altered, RN to check w/ team about a plan. [id number]. RDS     failed in MRI could not hold still and was pulling herself out of the scanner. Unsafe [id number] SF note in TB

## 2019-04-21 NOTE — Unmapped (Addendum)
OCCUPATIONAL THERAPY  Evaluation(Seen with Val Eagle, PT for safe patient mobility) (04/21/19 0821)    Patient Name:  Hayley Wagner       Medical Record Number: [id number]   Date of Birth: Oct 12, 1928  Sex: Female          OT Treatment Diagnosis:  Decreased (I) with ADLs and functional mobility    Assessment  Problem List: Decreased endurance, Impaired balance, Decreased mobility, Fall Risk, Impaired ADLs, Pain, Decreased strength, Impaired judgement, Decreased safety awareness, Impaired vision, Decreased cognition, Impaired sensation    Assessment: Hayley Wagner is a 86 y.o. female with primary myelofibrosis (on Ruxolitinib 10 mg PO BID), CAD, thoracoabdominal aneurysm, HTN, cognitive dysfunction who presented to [facility] with Altered mental status. At this time the pt presents with problem list as documented above, all of which limit ADL and functional mobility.  This indicates that the pt would benefit from OT services to maximize functional independence and safety.  After review of the patient's occupational profile and history, assessment of occupational performance, clinical decision making, and development of POC, the patient presents as a moderate complexity case.    Today's Interventions: Assisted with feeding ADL while in bed in chair position. Assisted with grooming ADL while EOB    Activity Tolerance During Today's Session  Patient limited by Mental Status, Patient limited by pain    Plan  Planned Frequency of Treatment:  1-2x per day for: 3-4x week     Planned Interventions:  Adaptive equipment, Balance activities, ADL retraining, Bed mobility, Compensatory tech. training, Conservation, Education - Patient, Home exercise program, Functional mobility, Endurance activities, Education - Family / caregiver, Neuromuscular re-education, Insurance account manager / Proximal stability, Positioning, Range of motion, Safety education, Visual / perceptual tasks, UE Strength / coordination exercise, Transfer training, Therapeutic exercise, Functional cognition    Post-Discharge Occupational Therapy Recommendations:  OT Post Acute Discharge Recommendations: 5x weekly, Low intensity   OT DME Recommendations: Defer to post acute    GOALS:   Patient and Family Goals: to feel better    Long Term Goal #1: Performed self care routine with S within 6 weeks     Short Term:  Pt will participate in OOB assessment   Time Frame : 2 weeks  Pt will perform bathing with set up   Time Frame : 2 weeks  Pt will UB dress with set up   Time Frame : 2 weeks    Prognosis:  Fair  Positive Indicators:  Good PLOF as recently as 2 weeks ago.  Barriers to Discharge: Endurance deficits, Cognitive deficits, Impaired Balance, Inability to safely perform ADLS, Functional strength deficits, Pain, Decreased safety awareness, Severity of deficits    Subjective  Patient / Caregiver reports: I like classical music very much    Prior Functional Status Pt d/c from [facility] to a SNF on 04/07/19. Per CCM note on 04/01/19 Pt was mostly independent prior to admission, but family expresses concern about pt's confusion and wants to ensure that she is safe after discharge. The pt has hired assistance from a woman/friend Clydie Braun who helps with housekeeping, transportation and more as needed. Neighbors are also available to offer assistance and support. Prior to 04/2018, per chart, pt was working as a Production manager, driving, and independent with ADL. Per MD note in 04/2018 She has been a vegetarian for a few years. 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 She does not eat dark green leafy vegetables.  The patient is widowed. She has  dual citizenship with [place] and the [place]. She is from [place]. 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 CAD (coronary artery disease)    ??? Cancer (CMS-HCC)    ??? Hypertension     Social History     Tobacco Use   ??? Smoking status: Former Smoker   ??? Smokeless tobacco: Never Used   Substance Use Topics   ??? Alcohol use: Yes      No past surgical history on file. History reviewed. No pertinent family history.     Patient has no known allergies.     Objective Findings  Current Status Pt received/left semi reclined in bed, RN and NA aware, all needs met, call bell within reach. bed alarm activited    Vitals / Orthostatics  With Activity: O2=99, HR=79 after sitting EOB    Precautions / Restrictions  Falls precautions    Weight Bearing  Non-applicable    Required Braces or Orthoses  Non-applicable    Communication Preference  Verbal    Pain  c/o pain in R eye/side of face. reports this as a shooting/burning pain, unable to rate. RN made aware    Equipment / Environment  Vascular access (PIV, TLC, Port-a-cath, PICC), Caregiver wearing mask for full session, Patient not wearing mask for full session     Cognition   Orientation Level:  Disoriented to situation, Disoriented to place   Arousal/Alertness:  Appropriate responses to stimuli   Attention Span:  Attends with cues to redirect   Memory:  Unable to assess   Following Commands:  Follows one step commands with repetition, Follows one step commands with increased time   Safety Judgment:  Decreased awareness of need for safety   Awareness of Errors:  Unable to assess   Comments: Per OT evaluation 2 weeks ago, pt was A&O x 4, followed direction well, and had good safety awareness. Today, pt reported she was in [place] and required increased assist to follow instructions    Vision / Perception    Vision: Wears glasses all the time(none visable in room)  Perception: grossly intact  Comments: Pt has a history of visual deficits and difficulty with bright lights, however pt with significant discomfort in R eye keeping her eyes closed and her R hand cupped over her R eye throughout most of sessin.    Hand Function  Hand Dominance: right  B grip WFL    Skin Inspection  Crusted sore above lip. Pt with recent dx of shingles    ROM / Strength/Coordination  UE ROM/ Strength/ Coordination: B UE WFL  LE ROM/ Strength/ Coordination: B LE WFL noted during functional tasks.    Sensation:  c/o burning sensation along R side of face.    Balance:  Sitting=CGA to MOD A for corrections. Standing=Deferred 2/2 increased c/o pain and discomfort and overall lack of tolerance by pt    Functional Mobility  Transfer Assistance Needed: (NT)  Bed Mobility Assistance Needed: Yes  Bed Mobility - Needs Assistance: Min assist    ADLs  Feeding - Needs Assistance: Mod assist  Grooming - Needs Assistance: Min assist  Bathing - Needs Assistance: Mod assist  Toileting - Needs Assistance: (NT)  UB Dressing - Needs Assistance: Mod assist  LB Dressing - Needs Assistance: (NT)  IADLs: NT     Occupational Therapy Session Duration  OT Individual - Duration: 40       I attest that I have reviewed the above information.  Signed: Celesta Aver, OT  Filed 04/21/2019

## 2019-04-21 NOTE — Unmapped (Signed)
Care Management  Initial Transition Planning Assessment              General  Care Manager assessed the patient by : Medical record review, Discussion with Clinical Care team  Orientation Level: Oriented X4  Functional level prior to admission: Dependent  Reason for referral: Discharge Planning    Contact/Decision Maker  Extended Emergency Contact Information  Primary Emergency Contact: Remer Macho  Mobile Phone: [phone]  Relation: Daughter in [place]  Preferred language: ENGLISH  Interpreter needed? No  Secondary Emergency Contact: Badman,toby  Mobile Phone: [phone]  Relation: Son    Legal Next of Kin / Guardian / POA / Advance Directives                      Patient Information  Lives with: Other (Comment)(Currently at [facility])    Type of Residence: SNF (Skilled nursing facility)  Facility (Name/Phone #): [facility]  [phone]  Return to facility?: Yes  Location/Detail: [address] [place]    Support Systems/Concerns: Family Members, Children    Responsibilities/Dependents at home?: No    Home Care services in place prior to admission?: No                  Equipment Currently Used at Home: cane, straight       Currently receiving outpatient dialysis?: No       Financial Information       Need for financial assistance?: No       Social Determinants of Health  Social Determinants of Health were addressed in provider documentation.  Please refer to patient history.    Discharge Needs Assessment  Concerns to be Addressed: discharge planning    Clinical Risk Factors: > 65, History of Falls, New Diagnosis, Multiple Diagnoses (Chronic)    Barriers to taking medications: No    Prior overnight hospital stay or ED visit in last 90 days: Yes    Readmission Within the Last 30 Days: current reason for admission unrelated to previous admission         Anticipated Changes Related to Illness: none    Equipment Needed After Discharge: other (see comments)(Per PT/OT recommendations)    Discharge Facility/Level of Care Needs: nursing facility, skilled    Readmission  Risk of Unplanned Readmission Score: UNPLANNED READMISSION SCORE: 15%  Predictive Model Details           16% (Medium) Factors Contributing to Score   Calculated 04/21/2019 09:47 17% Number of active Rx orders is 22   [facility] Risk of Unplanned Readmission Model 13% Number of ED visits in last six months is 2     11% ECG/EKG order is present in last 6 months     9% Latest BUN is high (33 mg/dL)     8% Age is 89     8% Imaging order is present in last 6 months     7% Latest hemoglobin is low (8.7 g/dL)     7% Phosphorous result is present     6% Number of hospitalizations in last year is 1     5% Active anticoagulant Rx order is present     5% Active corticosteroid Rx order is present     2% Future appointment is scheduled     1% Charlson Comorbidity Index is 1     1% Active ulcer medication Rx order is present     1% Current length of stay is 0.464 days  Readmitted Within the Last 30 Days? (No if blank) Yes  Patient at risk for readmission?: Yes    Discharge Plan  Screen findings are: Discharge planning needs identified or anticipated (Comment).    Expected Discharge Date:     Expected Transfer from Critical Care:         Patient and/or family were provided with choice of facilities / services that are available and appropriate to meet post hospital care needs?: Yes   List choices in order highest to lowest preferred, if applicable. : [facility]    Initial Assessment complete?: Yes

## 2019-04-21 NOTE — Unmapped (Signed)
Received 2nd COVID today, confused at baseline, now more confused per staff at [facility]. Glucose 163mg /dL. VSS.

## 2019-04-21 NOTE — Unmapped (Signed)
The patient's facial lesions are crusted over. She does not need airborne or contact precautions.

## 2019-04-21 NOTE — Unmapped (Signed)
Medicine History and Physical    Assessment/Plan:    Principal Problem:    Altered mental status  Active Problems:    Shingles  Resolved Problems:    * No resolved hospital problems. *      Hayley Wagner is a 86 y.o. female with primary myelofibrosis (on Ruxolitinib 10 mg PO BID), CAD, thoracoabdominal aneurysm, HTN, cognitive dysfunction who presented to [facility] with Altered mental status.    AMS, possible polypharmacy: Reported acute mental status change after returning from Covid vaccinations.  Unable to find evidence or credible report of linking COVID-19 vaccine with altered mental status or stroke.  Patient is moving all 4 extremities and without overt signs of inflammation making infectious etiology less likely. Vascular etiology still possible. Most likely culprit will be polypharmacy given recent increase of gabapentin dose and the addition of mirtazapine and carbamazepine, especially carbamazepine first dose was this morning before the altered mental status.  Carbamazepine was also reported to have speech disturbance and altered mental status which patient had both.  Per neurology consult felt was less likely to be meningitis/encephalitis, and her clinical presentation was not consistent with a large vessel occlusion.  -Holding mirtazapine, gabapentin, carbamazepine  -Discontinue paroxetine antibiotics given lack of signs of infection  -Delirium precaution  -f/u MRI brain and holding off LP per neurology, would still consider LP if persistently altered  -Carbamazepine level  -TSH  -Urine tox screen  -Follow-up blood culture    R facial rash: Reported previous vascular rash with the V2 distribution and already completed a course of valacyclovir 1 g twice daily for 7 days.  Continues to report right-sided facial pain and per care provider with worsening right-sided redness and swelling.  Unclear about the actual evolution of the rash.  Certainly the pain could be associated with postherpetic neuralgia however the lack of improvement with Valtrex raise question for the shingles diagnosis.  No warmth or specific tenderness argue against cellulitis.  Oral exam limited but not remarkable.  -Further history and evaluation with mental status improve  -At this point would favor dermatology consult, order placed  - holding voltaren gel until mentation improves    Mild bilirubin and AST elevation: Likely recovering from previous transaminase elevation which was attributed to Valtrex. No abdominal pain on exam  -Repeat  in a.m. to ensure stability after 1 IV dose    Chronic medical conditions:  Myelofibrosis: Continue home ruxolitinib 10 mg twice daily      Diet: Regular  VTE ppx: lovenox  GI ppx: non-indicated  IV Access: PIV  Code status: Full Code  Dispo:  floor      ___________________________________________________________________    Chief Complaint:  Chief Complaint   Patient presents with   ??? Altered Mental Status     Altered mental status    HPI:  Hayley Wagner is a 86 y.o. female with primary myelofibrosis (on Ruxolitinib 10 mg PO BID), CAD, thoracoabdominal aneurysm, HTN, cognitive dysfunction who presented to [facility] with Altered mental status.    History was obtained by ED providers and talking to SNF personnel given patient's confusion    Patient was sent to the ED this afternoon after returning to [facility] after receiving her Covid vaccinations.  Reportedly patient was at her baseline this morning to the nurse taking care of her.  She was able to recognize her and talk normally despite her baseline dementia.  Was able to take her medications and p.o. intake without difficulties.  Per report when  she returned to the SNF in the afternoon she was very altered and was speaking intelligibly with babbling.     Upon arrival to the ED initially was concern for stroke and neurology was consulted.  Given the sudden changes of her mentation was also broadly covered with cefepime, acyclovir.  She was very agitated and was constantly moving which she received 1 mg of IV Haldol and 1 mg of IV Ativan.  Neurology evaluated her and was not convinced to be either ischemic stroke or meningitis/encephalitis.    Upon further questioning with the SNF staff and looking at the [facility] patient has been reporting worsening right facial pain in the past few days and had some changes in the medication.  Her gabapentin was increased from 100 mg nightly to 100 mg 3 times daily on 2/23 as well as the addition of mirtazapine at nighttime.  This morning she was also started on Voltaren gel as well as received 200 mg of carbamazepine this morning      Allergies:  Patient has no known allergies.    Medications:   Prior to Admission medications    Medication Dose, Route, Frequency   gabapentin (NEURONTIN) 100 MG capsule 100 mg, Oral, 3 times a day (standard)   mirtazapine (REMERON) 15 MG tablet 7.5 mg, Oral, Nightly   acetaminophen (TYLENOL) 325 MG tablet 650 mg, Oral, Every 8 hours   ascorbate calcium, bulk, Powd 250 mg, Oral, Daily   ascorbic acid, vitamin C, (VITAMIN C) 250 MG tablet 250 mg, Oral, Daily (standard)   aspirin 81 MG chewable tablet 81 mg, Oral, Daily (standard)   betamethasone valerate (VALISONE) 0.1 % lotion 1 application, Topical, 2 times a day (standard)   ergocalciferol, vitamin D2, 50 mcg (2,000 unit) cap 1 capsule, Oral, Daily   famotidine (PEPCID) 20 MG tablet 20 mg, Oral, 2 times a day (standard)   lidocaine 2% viscous (XYLOCAINE) 2 % Soln 15 mL, Mouth, Every 4 hours PRN   metroNIDAZOLE (METROGEL) 1 % gel Small amount as needed twice a day   multivitamin (TAB-A-VITE/THERAGRAN) per tablet 1 tablet, Oral, Daily (standard)   mupirocin (BACTROBAN) 2 % ointment Apply 1 application topically 2 (two) times daily. Behind R ear   RESTASIS MULTIDOSE 0.05 % Drop 1 drop, Both Eyes, Nightly PRN   ruxolitinib (JAKAFI) 5 mg tablet 10 mg, Oral, 2 times a day (standard)   simvastatin (ZOCOR) 20 MG tablet TAKE 1 TABLET BY MOUTH ONCE DAILY       Medical History:  Past Medical History:   Diagnosis Date   ??? CAD (coronary artery disease)    ??? Cancer (CMS-HCC)    ??? Hypertension        Surgical History:  No past surgical history on file.    Social History:  Social History     Socioeconomic History   ??? Marital status: Widowed     Spouse name: Not on file   ??? Number of children: Not on file   ??? Years of education: Not on file   ??? Highest education level: Not on file   Occupational History   ??? Not on file   Social Needs   ??? Financial resource strain: Not very hard   ??? Food insecurity     Worry: Never true     Inability: Never true   ??? Transportation needs     Medical: No     Non-medical: No   Tobacco Use   ??? Smoking status: Former Smoker   ???  Smokeless tobacco: Never Used   Substance and Sexual Activity   ??? Alcohol use: Yes   ??? Drug use: Not on file   ??? Sexual activity: Not on file   Lifestyle   ??? Physical activity     Days per week: Not on file     Minutes per session: Not on file   ??? Stress: Not on file   Relationships   ??? Social Wellsite geologist on phone: Not on file     Gets together: Not on file     Attends religious service: Not on file     Active member of club or organization: Not on file     Attends meetings of clubs or organizations: Not on file     Relationship status: Widowed   Other Topics Concern   ??? Not on file   Social History Narrative   ??? Not on file       Family History:  History reviewed. No pertinent family history.    Review of Systems:  10 systems reviewed and are negative unless otherwise mentioned in HPI    Labs/Studies:  Labs and Studies from the last 24hrs per EMR and Reviewed and   CBC -   Results in Past 2 Days  Result Component Current Result   WBC 8.3 (04/20/2019)   RBC 2.72 (L) (04/20/2019)   HGB 8.7 (L) (04/20/2019)   HCT 26.7 (L) (04/20/2019)   MCV 98.2 (04/20/2019)   MCH 32.0 (04/20/2019)   MCHC 32.6 (04/20/2019)   MPV 10.2 (H) (04/20/2019)   Platelet 336 (04/20/2019)     BMP -   Results in Past 2 Days  Result Component Current Result   Sodium 140 (04/20/2019)   Potassium 3.6 (04/20/2019)   Chloride 112 (H) (04/20/2019)   CO2 19.0 (L) (04/20/2019)   BUN 32 (H) (04/20/2019)   Creatinine 0.89 (04/20/2019)   EST.GFR (MDRD) Not in Time Range   Glucose 106 (04/20/2019)       Physical Exam:  BP 117/59  - Pulse 93  - Temp 36.4 ??C (Axillary)  - Resp 14  - Ht 144.8 cm (4' 9)  - Wt 53.8 kg (118 lb 8 oz)  - SpO2 97%  - BMI 25.64 kg/m??     Gen: Patient is a  86 y.o. y/o female appears frail and mildly in distress  HEENT: PERRL, EOMI, scleral non-icteric, moist mucous membranes. Right face with erythema and some swelling, however no warmth. One raw skin lesion at right upper corner of lip with a scab.  Neck: Tracheal midline, no LAD. No meningismus  Cardiac: Regular rate and rhythm with normal S1 and S2, no murmur/gallops/rubs.  Respiratory: Normal respiratory effort, clear to auscultation bilaterally, no wheeze, rales, rhonchi  Abdomen: Soft, non-tender, non-distended  Extremity: Warm, non-cyanotic, no edema, 2+ bilateral radial pulses  Skin: dry and warm, no lesions  Neuro: eyes closed and constantly shifting in bed, oriented to person only, sometimes answer appropriately with short phrase, other times will speak in gibberish and unintelligible, squeeze hands but doesn't let go on command, minimally following commands  Psych: Normal mood and affect      Chih-Huan Ree Kida) Reynolds Bowl, MD  Resident Physician PGY-2  [facility] Internal Medicine

## 2019-04-21 NOTE — Unmapped (Signed)
[facility]  Emergency Department Provider Note              ED Clinical Impression      Final diagnoses:   Altered mental status, unspecified altered mental status type (Primary)           Impression, ED Course, Assessment and Plan      Impression: 86 year old female with past medical history of primary myelofibrosis, coronary artery disease, thoracoabdominal aneurysm, hypertension, recent UTI with altered mental status with discharge from [facility] on 02/12/2020, recent zoster infection involving V2 of right side of face no longer on antivirals presents with acute onset severe dysarthria after receiving COVID-19 vaccine today.  Her last known normal was 12:30 PM when she left the SNF to receive her vaccine.  When she returned at 2:30 PM she was no longer able to say words and would babble in response to questions.  She had no motor deficits.  No facial droop.  Has completed Covid vaccine series.    Physical exam notable for spontaneous movement of all 4 extremities with no neglect or facial droop. Severely dysarthric with no intelligible words. PT does not participate in exam but is keenly awake.  NIHSS 18. Crusting/scaling lateral to right vermilion border consistent with her zoster infection.    Code stroke called.    CT head unremarkable for acute findings.  Will pursue other causes of AMS.  Neurology recommending MRI with and without contrast.     Differential includes stroke versus herpes zoster encephalitis versus sepsis versus toxic versus metabolic versus hematologic.  Unable to perform lumbar puncture due to patient's altered mental status and inability to safely sedate.  Given unknown source of symptoms and potential for infection will empirically cover with acyclovir, vancomycin, and cefepime.    6:38 PM   Urinalysis unremarkable for infection.  Lactate 2.4.  Will give 1 L normal saline and reassess.  Hemoglobin 8.7 which is at baseline.  White count 8.3.  CMP with normal creatinine, normal sodium and potassium, no gap.    8:58 PM lactate 1.6.  Lipase 98.  Phosphorus 3.3.  Magnesium 1.8.  Chest x-ray unremarkable.    9:26 PM patient unable to get MRI due to movement.  We premedicated her with Ativan however this was not sufficient.  Neurology paged for final recommendations.  Patient still unable to speak coherently or ambulate at this time.    9:34 PM spoke with neurology.  They recommend medicine admission for further work-up.  MAO paged.    10:07 PM TSH normal.  Patient assigned to Med K.     10:32 PM spoke to ID.  They will admit.  Patient stable this time.    2300 patient signed out to incoming resident.  Patient stable at this time and sleeping.   Additional Medical Decision Making     I have reviewed the vital signs and the nursing notes. Labs and radiology results that were available during my care of the patient were independently reviewed by me and considered in my medical decision making.       I directly visualized and independently interpreted the EKG tracing.   I independently visualized the radiology images.   I reviewed the patient's prior medical records.  I discussed the case with the neurology consultant.   I discussed the case and plan for continuity of care with the admitting provider.      Portions of this record have been created using Scientist, clinical (histocompatibility and immunogenetics). Dictation errors have  been sought, but may not have been identified and corrected.  ____________________________________________         History        Chief Complaint  Altered Mental Status      HPI   86 year old female with past medical history of primary myelofibrosis, coronary artery disease, thoracoabdominal aneurysm, hypertension, recent UTI with altered mental status with discharge from [facility] on 02/12/2020, recent zoster infection involving V2 of right side of face no longer on antivirals presents with acute onset severe dysarthria after receiving COVID-19 vaccine today.  Her last known normal was 12:30 PM when she left the SNF to receive her vaccine.  When she returned at 2:30 PM she was no longer able to say words and would babble in response to questions.  She had no motor deficits.  No facial droop.  Has completed Covid vaccine series.  Attempted to call SNF but received no answer.  I spoke to her daughter-in-law who states that the patient's son had talked to her this morning and she appeared largely normal at that time.  They have had some concerns for some mild dementia progressing she would occasionally say things that did not make sense but she was at her baseline this morning.  She also states that she has been complaining of continued right facial pain after her zoster.  It appears that she stopped her oral antivirals 1 to 2 weeks ago per documentation.  She is normally able to ambulate with a walker at baseline per family.  Review of systems otherwise unobtainable.      Past Medical History:   Diagnosis Date   ??? CAD (coronary artery disease)    ??? Cancer (CMS-HCC)    ??? Hypertension        Patient Active Problem List   Diagnosis   ??? 2-vessel coronary artery disease   ??? Thoracoabdominal aneurysm (CMS-HCC)   ??? Chest pain, unspecified   ??? Chronic kidney disease, stage 3   ??? Descending thoracic aortic aneurysm (CMS-HCC)   ??? Ectatic thoracic aorta (CMS-HCC)   ??? Essential hypertension   ??? Hyperlipidemia   ??? IGT (impaired glucose tolerance)   ??? Increased frequency of urination   ??? Laxity of skin   ??? Palpitations   ??? Rectocele, female   ??? Rosacea   ??? Primary myelofibrosis (CMS-HCC)   ??? Altered mental status   ??? AKI (acute kidney injury) (CMS-HCC)   ??? UTI (urinary tract infection)   ??? Fall       No past surgical history on file.      Current Facility-Administered Medications:   ???  acyclovir (ZOVIRAX) 400 mg in sodium chloride (NS) 0.9 % 100 mL IVPB, 400 mg, Intravenous, Once, [place] Christopher Keyri Salberg, MD  ???  cefepime (MAXIPIME) 2 g in dextrose 100 mL IVPB (premix), 2 g, Intravenous, Once, [place] Christopher Jannessa Ogden, MD  ???  sodium chloride 0.9% (NS) bolus 1,000 mL, 1,000 mL, Intravenous, Once, [place] Christopher Khing Belcher, MD  ???  vancomycin (VANCOCIN) IVPB 1000 mg (premix), 1,000 mg, Intravenous, Once, [place] Christopher Keitha Kolk, MD    Current Outpatient Medications:   ???  acetaminophen (TYLENOL) 325 MG tablet, Take 2 tablets (650 mg total) by mouth every eight (8) hours for 14 days., Disp: 84 tablet, Rfl: 0  ???  ascorbate calcium, bulk, Powd, Take 250 mg by mouth daily. , Disp: , Rfl:   ???  ascorbic acid, vitamin C, (VITAMIN C) 250 MG tablet, Take 250 mg by mouth daily., Disp: , Rfl:   ???  aspirin 81 MG chewable tablet, Chew 81 mg daily. , Disp: , Rfl:   ???  betamethasone valerate (VALISONE) 0.1 % lotion, Apply 1 application topically Two (2) times a day. , Disp: , Rfl:   ???  ergocalciferol, vitamin D2, 50 mcg (2,000 unit) cap, Take 1 capsule by mouth daily. , Disp: , Rfl:   ???  famotidine (PEPCID) 20 MG tablet, Take 1 tablet (20 mg total) by mouth Two (2) times a day., Disp: 60 tablet, Rfl: 11  ???  lidocaine 2% viscous (XYLOCAINE) 2 % Soln, 15 mL by Mouth route every four (4) hours as needed (for mouth pain prior to eating)., Disp: 1 Bottle, Rfl: 0  ???  metroNIDAZOLE (METROGEL) 1 % gel, Small amount as needed twice a day, Disp: , Rfl:   ???  multivitamin (TAB-A-VITE/THERAGRAN) per tablet, Take 1 tablet by mouth daily. , Disp: , Rfl:   ???  mupirocin (BACTROBAN) 2 % ointment, Apply 1 application topically 2 (two) times daily. Behind R ear, Disp: , Rfl:   ???  RESTASIS MULTIDOSE 0.05 % Drop, Administer 1 drop to both eyes nightly as needed. , Disp: , Rfl:   ???  ruxolitinib (JAKAFI) 5 mg tablet, Take 2 tablets (10 mg total) by mouth Two (2) times a day., Disp: 120 tablet, Rfl: 3  ???  simvastatin (ZOCOR) 20 MG tablet, TAKE 1 TABLET BY MOUTH ONCE DAILY, Disp: , Rfl:     Allergies  Patient has no known allergies.    History reviewed. No pertinent family history.    Social History  Social History     Tobacco Use   ??? Smoking status: Former Smoker   ??? Smokeless tobacco: Never Used   Substance Use Topics   ??? Alcohol use: Yes   ??? Drug use: Not on file       Review of Systems  As above, otherwise unobtainable due to patient's altered mental status.     Physical Exam     This provider entered the patient's room: Yes:    ? If this provider did not enter the room, a comprehensive physical exam was not able to be performed due to increased infection risk to themselves, other providers, staff and other patients), as well as to conserve personal protective equipment (PPE) utilization during the COVID-19 pandemic.    ? If this provider did enter the patient room, the following was PPE worn: N95, eye protection, gown and gloves    ED Triage Vitals   Enc Vitals Group      BP 04/20/19 1706 146/68      Heart Rate 04/20/19 1708 69      SpO2 Pulse 04/20/19 1707 67      Resp 04/20/19 1708 (!) 33      Temp 04/20/19 1711 36.1 ??C (97 ??F)      Temp src --       SpO2 04/20/19 1707 100 %      Weight --       Height --       Head Circumference --       Peak Flow --       Pain Score --       Pain Loc --       Pain Edu? --       Excl. in GC? --            Constitutional: Alert and anxious appearing.   Eyes: Conjunctivae are normal.  Pupils equal round reactive to light and  3 mm.  ENT       Head: Normocephalic and atraumatic.       Nose: No congestion.       Mouth/Throat: Mucous membranes are moist.       Neck: No stridor.  Hematological/Lymphatic/Immunilogical: No cervical lymphadenopathy.  Cardiovascular: Normal rate, regular rhythm. Normal and symmetric distal pulses are present in all extremities.  Respiratory: Normal respiratory effort. Breath sounds are normal.  Gastrointestinal: Soft and nontender.  No rebound or guarding there is no CVA tenderness.  Musculoskeletal: Patient moves all 4 extremities.  Neurologic: NIHSS 18. Severely dysarthric language.  No facial asymmetry.  Pupils 3 and reactive bilaterally.  Moves all extremities.  Would not follow commands.  Withdraws from pain.  Unable to hold limbs against gravity.  Skin: Crusting/scaling lateral to right vermilion border with no obvious active herpetic lesions.  Psychiatric: Patient appears anxious.     EKG     Encounter Date: 04/20/19   ECG 12 lead (Adult)   Result Value    EKG Systolic BP     EKG Diastolic BP     EKG Ventricular Rate 77    EKG Atrial Rate 77    EKG P-R Interval 176    EKG QRS Duration 72    EKG Q-T Interval 442    EKG QTC Calculation 500    EKG Calculated P Axis 69    EKG Calculated R Axis 34    EKG Calculated T Axis 62    QTC Fredericia 480    Narrative    NORMAL SINUS RHYTHM  ST & T WAVE ABNORMALITY, CONSIDER ANTERIOR ISCHEMIA  ABNORMAL ECG  WHEN COMPARED WITH ECG OF 31-Mar-2019 18:00,  PREMATURE VENTRICULAR BEATS ARE NO LONGER PRESENT  NONSPECIFIC T WAVE ABNORMALITY, WORSE IN LATERAL LEADS  QT HAS LENGTHENED          Radiology     XR Chest Portable   Final Result      No acute airspace disease.      CT head WO contrast   Final Result   -- No CT evidence for acute hemorrhage or infarct. Recommend correlation with symptoms and a follow-up MRI of the brain if clinically warranted.      -- Chronic small vessel ischemic change. Intracranial atherosclerosis.      MRI Brain W Wo Contrast    (Results Pending)                  [place] Christopher Neftaly Inzunza, MD  Resident  04/21/19 [phone]

## 2019-04-21 NOTE — Unmapped (Signed)
Pt VSS, pt has pain to the right eye, believed to be residual from Shingles from 2 wks ago. Pt is not A&O but can answer some questions and follows commands. Ophthalmology has been consulted. POC will continue        Problem: Skin Injury Risk Increased  Goal: Skin Health and Integrity  Outcome: Progressing     Problem: Fall Injury Risk  Goal: Absence of Fall and Fall-Related Injury  Outcome: Progressing     Problem: Adult Inpatient Plan of Care  Goal: Plan of Care Review  Outcome: Progressing  Goal: Patient-Specific Goal (Individualization)  Outcome: Progressing  Goal: Absence of Hospital-Acquired Illness or Injury  Outcome: Progressing  Goal: Optimal Comfort and Wellbeing  Outcome: Progressing  Goal: Readiness for Transition of Care  Outcome: Progressing  Goal: Rounds/Family Conference  Outcome: Progressing     Problem: Self-Care Deficit  Goal: Improved Ability to Complete Activities of Daily Living  Outcome: Progressing

## 2019-04-21 NOTE — Unmapped (Signed)
Initial Consult Note        Requesting Attending Physician:  Sandra Cockayne, MD  Service Requesting Consult: Emergency Medicine     Assessment and Plan          Hayley Wagner is a 86 y.o. female on whom I have been asked by Sandra Cockayne, MD to consult for code stroke.    AMS: LKN 12:30 PM prior to leaving to get COVID vaccine. Upon return to SNF (~2:30 PM), patient was noted to be altered. Brought in by EMS who were not present during my evaluation to provide additional information. Attempted to call SNF x 2 without answer. Her exam is non-focal. She is able to move all extremities spontaneously, withdraws to noxious in all extremities, no gaze deviation. Mental status appears decreased from baseline - per chart review she has MCI and can communicate appropriately. At this time she is awake and alert, but unable to follow commands. While her NIHSS is 18, this is mostly due to her inability to follow commands. She is not a candidate for tPA (outside time window). Despite high NIH, her clinical picture is not consistent with a LVO and therefore would not recommend CTA head. In addition, I am reassured by her normal vital signs and alertness that her current presentation is not due to meningitis/encephalitis. Therefore would not pursue LP at this time. For delirium of unknown cause with negative head CT, it would be reasonable to obtain further neuroimaging as we have limited history and the patient is unable to perform most of the neurologic exam at this time. This could exclude acute or subacute stroke and multifocal inflammatory lesions.    Recommendations:   - Toxic/metabolic work-up per ED  - If above does not reveal an obvious treatable medical problem causing delirium, MRI brain w wo contrast    This patient was discussed with Dr. Roger Kill who agrees to the assessment and plan.  and Dr. Hope Budds was available.     Enid Derry, MD  Resident Physician - PGY2  Department of Neurology         HPI        Reason for Consult: CODE STROKE    Hayley Wagner is a 86 y.o. female on whom I have been asked by Sandra Cockayne, MD to consult for code stroke.    Unable to provide subjective. She is mumbling incoherently, very inattentive, and appears restless/in pain.        Stroke Specific HPI   Arrival Time: 1642 04/20/19  Date/Time of last known normal (provider): 12:30 today    Time of initial assessment: upon patient arrival  Assessment performed in person at bedside.    Time non-contrast CT head was personally reviewed: at time of acquisition    Initial NIHSS total score: 16  Modified Rankin Scale (mRS) pre-stroke: 5 = Severe disability; requiring constant nursing care and attention by a caregiver; generally bedridden and incontinent    Eligible for alteplase?: NO: Contraindications - Time of Onset:  Last known normal is unknown or outside the time window   Time of treatment decision made at: 17:25 today  Verbal order for alteplase given at N/A  Alteplase bolus initiated at: N/A  Reason for treatment delay? N/A    Was workup for large vessel occlusion indicated? No:  VAN Screening negative    Was patient referred for endovascular treatment? NO - No proximal occlusion        No Known Allergies  Current Facility-Administered Medications   Medication Dose Route Frequency Provider Last Rate Last Admin   ??? acyclovir (ZOVIRAX) 400 mg in sodium chloride (NS) 0.9 % 100 mL IVPB  400 mg Intravenous Once [place] Christopher Humphrey, MD         Current Outpatient Medications   Medication Sig Dispense Refill   ??? acetaminophen (TYLENOL) 325 MG tablet Take 2 tablets (650 mg total) by mouth every eight (8) hours for 14 days. 84 tablet 0   ??? ascorbate calcium, bulk, Powd Take 250 mg by mouth daily.      ??? ascorbic acid, vitamin C, (VITAMIN C) 250 MG tablet Take 250 mg by mouth daily.     ??? aspirin 81 MG chewable tablet Chew 81 mg daily.      ??? betamethasone valerate (VALISONE) 0.1 % lotion Apply 1 application topically Two (2) times a day.      ??? ergocalciferol, vitamin D2, 50 mcg (2,000 unit) cap Take 1 capsule by mouth daily.      ??? famotidine (PEPCID) 20 MG tablet Take 1 tablet (20 mg total) by mouth Two (2) times a day. 60 tablet 11   ??? lidocaine 2% viscous (XYLOCAINE) 2 % Soln 15 mL by Mouth route every four (4) hours as needed (for mouth pain prior to eating). 1 Bottle 0   ??? metroNIDAZOLE (METROGEL) 1 % gel Small amount as needed twice a day     ??? multivitamin (TAB-A-VITE/THERAGRAN) per tablet Take 1 tablet by mouth daily.      ??? mupirocin (BACTROBAN) 2 % ointment Apply 1 application topically 2 (two) times daily. Behind R ear     ??? RESTASIS MULTIDOSE 0.05 % Drop Administer 1 drop to both eyes nightly as needed.      ??? ruxolitinib (JAKAFI) 5 mg tablet Take 2 tablets (10 mg total) by mouth Two (2) times a day. 120 tablet 3   ??? simvastatin (ZOCOR) 20 MG tablet TAKE 1 TABLET BY MOUTH ONCE DAILY      (Not in a hospital admission)      Past Medical History:   Diagnosis Date   ??? CAD (coronary artery disease)    ??? Cancer (CMS-HCC)    ??? Hypertension        No past surgical history on file.    Social History     Socioeconomic History   ??? Marital status: Widowed     Spouse name: None   ??? Number of children: None   ??? Years of education: None   ??? Highest education level: None   Occupational History   ??? None   Social Needs   ??? Financial resource strain: Not very hard   ??? Food insecurity     Worry: Never true     Inability: Never true   ??? Transportation needs     Medical: No     Non-medical: No   Tobacco Use   ??? Smoking status: Former Smoker   ??? Smokeless tobacco: Never Used   Substance and Sexual Activity   ??? Alcohol use: Yes   ??? Drug use: None   ??? Sexual activity: None   Lifestyle   ??? Physical activity     Days per week: None     Minutes per session: None   ??? Stress: None   Relationships   ??? Social Wellsite geologist on phone: None     Gets together: None     Attends religious service: None  Active member of club or organization: None     Attends meetings of clubs or organizations: None     Relationship status: Widowed   Other Topics Concern   ??? None   Social History Narrative   ??? None         History reviewed. No pertinent family history.    Code Status: Full Code     Review of Systems     Unable to obtain due to patient's mental status.       Objective        Temp:  [36.1 ??C] 36.1 ??C  Heart Rate:  [69] 69  SpO2 Pulse:  [67-68] 68  Resp:  [33] 33  BP: (146)/(68) 146/68  MAP (mmHg):  [92] 92  SpO2:  [100 %] 100 %  No intake/output data recorded.    Physical Exam:  General Appearance:Chronically ill appearing. In no acute distress. Thin.  HEENT: Head is atraumatic and normocephalic. Sclera anicteric without injection.  Neck: Supple.  Lungs: Shallow rapid breathing. No stridor.  Heart: Bradycardic. Radial pulses 2+   Abdomen: Tender in suprapubic region. Mildly distended.  Extremities: No clubbing, cyanosis, or edema.    Neurological Examination:     Mental Status: Patient is awake but not following commands or responding to questions. Does not follow commands to open eyes raise their thumb.    Cranial Nerves: PERRL 2 mm. Face symmetric at rest. Normal facial movement bilaterally, including forehead, eye closure and grimace/smile.     Motor Exam: Normal bulk.  No tremors, myoclonus, or other adventitious movement.  RUE: withdraws to noxious stimulus.  LUE: withdraws to noxious stimulus.  RLE: withdraws to noxious stimulus.  LLE: withdraws to noxious stimulus.  Moving all extremities equally and spontaneously.      Reflexes:  Toes are downgoing bilaterally. Not tested    Sensory: Response to noxious stimulus as above.    Cerebellar/Coordination/Gait: Unable to assess due to mental status.       NIHSS:  (1a.) Level of Consciousness:  0 = Alert; keenly responsive   (1b.) LOC Questions:  2 = Answers neither question correctly   (1c.) LOC Commands:  1 = Performs one task correctly   (2.)   Best Gaze:  0 = Normal (3.)   Visual:  0 = No visual loss   (4.)   Facial Palsy:  0 = Normal symmetric movement   (5a.) Motor Arm, Left:  3 = No effort against gravity, limb falls   (5b.) Motor Arm, Right:  3 = No effort against gravity, limb falls   (6a.) Motor Leg, Left:  3 = No effort against gravity, limb falls   (6b.) Motor Leg, Right:  3 = No effort against gravity, limb falls   (7.)   Limb Ataxia:  0 = Absent   (8.)   Sensory:  0 = Normal; no sensory loss   (9.)   Best Language:  3 = Mute, global aphasia; no usable speech or auditory comprehension   (10.) Dysarthria:  0 = Normal   (11.) Extinction and Inattention:  0 = No abnormality   NIHSS Total Score:  18          Diagnostic Studies      All Labs Last 24hrs:   Recent Results (from the past 24 hour(s))   Comprehensive metabolic panel    Collection Time: 04/20/19  5:14 PM   Result Value Ref Range    Sodium 140 135 - 145 mmol/L  Potassium 3.6 3.5 - 5.0 mmol/L    Chloride 112 (H) 98 - 107 mmol/L    Anion Gap 9 7 - 15 mmol/L    CO2 19.0 (L) 22.0 - 30.0 mmol/L    BUN 32 (H) 7 - 21 mg/dL    Creatinine 1.61 0.96 - 1.00 mg/dL    BUN/Creatinine Ratio 36     EGFR CKD-EPI Non-African American, Female 58 (L) >=60 mL/min/1.57m2    EGFR CKD-EPI African American, Female 87 >=60 mL/min/1.41m2    Glucose 106 70 - 179 mg/dL    Calcium 8.3 (L) 8.5 - 10.2 mg/dL    Albumin 3.6 3.5 - 5.0 g/dL    Total Protein 6.3 (L) 6.5 - 8.3 g/dL    Total Bilirubin 1.3 (H) 0.0 - 1.2 mg/dL    AST 60 (H) 14 - 38 U/L    ALT 25 <35 U/L    Alkaline Phosphatase 70 38 - 126 U/L   Protime-INR    Collection Time: 04/20/19  5:14 PM   Result Value Ref Range    PT 13.2 10.5 - 13.5 sec    INR 1.12    CBC w/ Differential    Collection Time: 04/20/19  5:14 PM   Result Value Ref Range    WBC 8.3 4.5 - 11.0 10*9/L    RBC 2.72 (L) 4.00 - 5.20 10*12/L    HGB 8.7 (L) 12.0 - 16.0 g/dL    HCT 04.5 (L) 40.9 - 46.0 %    MCV 98.2 80.0 - 100.0 fL    MCH 32.0 26.0 - 34.0 pg    MCHC 32.6 31.0 - 37.0 g/dL    RDW 81.1 (H) 91.4 - 15.0 %    MPV 10.2 (H) 7.0 - 10.0 fL    Platelet 336 150 - 440 10*9/L    Variable HGB Concentration Slight (A) Not Present    Neutrophils % 66.6 %    Lymphocytes % 20.0 %    Monocytes % 7.0 %    Eosinophils % 0.9 %    Basophils % 1.8 %    Absolute Neutrophils 5.5 2.0 - 7.5 10*9/L    Absolute Lymphocytes 1.7 1.5 - 5.0 10*9/L    Absolute Monocytes 0.6 0.2 - 0.8 10*9/L    Absolute Eosinophils 0.1 0.0 - 0.4 10*9/L    Absolute Basophils 0.2 (H) 0.0 - 0.1 10*9/L    Large Unstained Cells 4 0 - 4 %    Microcytosis Slight (A) Not Present    Macrocytosis Marked (A) Not Present    Anisocytosis Marked (A) Not Present    Hypochromasia Moderate (A) Not Present   Lactate, Venous, Whole Blood    Collection Time: 04/20/19  6:03 PM   Result Value Ref Range    Lactate, Venous 2.4 (H) 0.5 - 1.8 mmol/L       [facility], 04/20/19, personally reviewed:  No acute abnl

## 2019-04-21 NOTE — Unmapped (Addendum)
PHYSICAL THERAPY  Evaluation (04/21/19 0820)     Patient Name:  Hayley Wagner       Medical Record Number: [id number]   Date of Birth: Oct 20, 1928  Sex: Female            Treatment Diagnosis: impaired mental status, pain, weakness impacting functional mobility    ASSESSMENT  Problem List: Decreased cognition, Gait deviation, Impaired balance, Fall Risk, Impaired ADLs, Decreased mobility, Decreased strength, Decreased safety awareness, Pain, Postural Weakness, Decreased endurance, Core weakness     Hayley Wagner is a 86 y.o. female with primary myelofibrosis (on Ruxolitinib 10 mg PO BID), CAD, thoracoabdominal aneurysm, HTN, cognitive dysfunction recent UTI who presented to [facility] with Altered mental status.  Patient presents to PT with deficits noted above contributing impacting all aspects of functional mobility.  Patient limited to EOB sitting this date 2/2 variable facial pain and fatigue.  Anticipate she will benefit from ongoing skilled PT services while in the acute setting as well as upon return to SNF to address deficits and assist to restore functional independence.  Based on the AM-PAC 5 item raw score of 9/20, the patient is considered to be 71.22% impaired with basic mobility. This indicates she is most appropriate for f/u PT 5x/wk in low intensity setting to best address rehabilitation needs.  After review of contributing co-morbidities and personal factors, clinical presentation and exam findings, patient demonstrates low complexity for evaluation and development of plan of care.        Today's Interventions: bed mobility, sitting tolerance/postural correction, patient education re: role of PT, POC, positioning / set up for breakfast        PLAN  Planned Frequency of Treatment:  1-2x per day for: 3-4x week      Planned Interventions: Balance activities, Education - Patient, Endurance activities, Functional mobility, Home exercise program, Self-care / Home training, Therapeutic exercise, Therapeutic activity, Transfer training    Post-Discharge Physical Therapy Recommendations:  5x weekly, Low intensity    PT DME Recommendations: Defer to post acute           Goals:   Patient and Family Goals: not stated    Long Term Goal #1: pending OOB mobiltiy assessment       SHORT GOAL #1: Patient will demo independence with EOB sitting x 10 minutes for participation in therapeutic activity and prep to stand              Time Frame : 2 weeks  SHORT GOAL #2: Patient will participate in OOB assessment with PT              Time Frame : 1 week                    Prognosis:  Fair     Barriers to Discharge: Endurance deficits, Cognitive deficits, Impaired Balance, Inability to safely perform ADLS, Functional strength deficits, Pain, Decreased safety awareness, Severity of deficits    SUBJECTIVE  Patient reports: Consents to PT  Current Functional Status: recieved / session completion in bed, set up for breakfast by OT who remains at bedside  Services patient receives: PT, OT     Equipment available at home: Cane(at home)     Past Medical History:   Diagnosis Date   ??? CAD (coronary artery disease)    ??? Cancer (CMS-HCC)    ??? Hypertension     Social History     Tobacco Use   ??? Smoking status: Former Smoker   ???  Smokeless tobacco: Never Used   Substance Use Topics   ??? Alcohol use: Yes      No past surgical history on file. History reviewed. No pertinent family history.     Allergies: Patient has no known allergies.                Objective Findings  Precautions / Restrictions  Precautions: Falls precautions  Weight Bearing Status: Non-applicable  Required Braces or Orthoses: Non-applicable    Communication Preference: Verbal   Pain Comments: c/o facial pain, primarily R side, frequently covering eye/ r side of face with hand, did not rate, RN aware  Medical Tests / Procedures: reviewed in chart  Equipment / Environment: Vascular access (PIV, TLC, Port-a-cath, PICC), Patient not wearing mask for full session At Rest: O2 sat 98% HR 78 bpm,  With Activity: c/o transient dizziness with sitting, NAD          Living Situation  Living Environment: Facility  Lives With: Care Staff     Cognition: alert, oriented to self, [place], follows simple commands but requires frequent redirection  Visual / Perception Status: reports she wears glasses (not available)  Skin Inspection: visible areas c/d/i    UE ROM: WFL  UE Strength: WFL  LE ROM: WFL  LE Strength: WFL                Coordination: gross motor movements intact      Sensation: intact to light touch B LE's  Balance: sitting: contact guard -mod assist intermittently for correction, standing: n/a   Posture: kyphosis     Bed Mobility: Supine-sit EOB with contact guard  Transfers: OOB deferred 2/2 decreased sitting tolerance d/t facial pain, decreased activity tolerance   Gait  Level of Assistance: Dependent, patient does less than 25%  Distance Ambulated (ft): 0 ft  Gait: n/a         Endurance: decreased activity tolerance     25 minutes Co-eval - Hayley Wagner, OTR/L to maximize safe mobility progression         Medical Staff Made Aware: RN    I attest that I have reviewed the above information.  Signed: Madlyn Frankel Kenneisha Wagner, PT  Ceasar Mons 06/01/8117

## 2019-04-22 LAB — CBC W/ AUTO DIFF
BASOPHILS ABSOLUTE COUNT: 0.1 10*9/L (ref 0.0–0.1)
BASOPHILS RELATIVE PERCENT: 1.1 %
EOSINOPHILS ABSOLUTE COUNT: 0.1 10*9/L (ref 0.0–0.4)
EOSINOPHILS RELATIVE PERCENT: 0.8 %
HEMATOCRIT: 25.9 % — ABNORMAL LOW (ref 36.0–46.0)
HEMOGLOBIN: 8.4 g/dL — ABNORMAL LOW (ref 12.0–16.0)
LARGE UNSTAINED CELLS: 4 % (ref 0–4)
LYMPHOCYTES ABSOLUTE COUNT: 1.4 10*9/L — ABNORMAL LOW (ref 1.5–5.0)
LYMPHOCYTES RELATIVE PERCENT: 16.5 %
MEAN CORPUSCULAR HEMOGLOBIN CONC: 32.5 g/dL (ref 31.0–37.0)
MEAN CORPUSCULAR HEMOGLOBIN: 32.7 pg (ref 26.0–34.0)
MEAN CORPUSCULAR VOLUME: 100.5 fL — ABNORMAL HIGH (ref 80.0–100.0)
MEAN PLATELET VOLUME: 10.4 fL — ABNORMAL HIGH (ref 7.0–10.0)
MONOCYTES ABSOLUTE COUNT: 0.7 10*9/L (ref 0.2–0.8)
MONOCYTES RELATIVE PERCENT: 8.7 %
NEUTROPHILS RELATIVE PERCENT: 69.1 %
PLATELET COUNT: 329 10*9/L (ref 150–440)
RED BLOOD CELL COUNT: 2.58 10*12/L — ABNORMAL LOW (ref 4.00–5.20)
RED CELL DISTRIBUTION WIDTH: 25.9 % — ABNORMAL HIGH (ref 12.0–15.0)
WBC ADJUSTED: 8.2 10*9/L (ref 4.5–11.0)

## 2019-04-22 LAB — BASIC METABOLIC PANEL
ANION GAP: 5 mmol/L — ABNORMAL LOW (ref 7–15)
BLOOD UREA NITROGEN: 27 mg/dL — ABNORMAL HIGH (ref 7–21)
BUN / CREAT RATIO: 37
CALCIUM: 8.9 mg/dL (ref 8.5–10.2)
CHLORIDE: 108 mmol/L — ABNORMAL HIGH (ref 98–107)
CO2: 27 mmol/L (ref 22.0–30.0)
CREATININE: 0.73 mg/dL (ref 0.60–1.00)
EGFR CKD-EPI AA FEMALE: 85 mL/min/{1.73_m2} (ref >=60)
GLUCOSE RANDOM: 118 mg/dL (ref 70–179)
POTASSIUM: 3.6 mmol/L (ref 3.5–5.0)

## 2019-04-22 LAB — MEAN CORPUSCULAR HEMOGLOBIN: Erythrocyte mean corpuscular hemoglobin:EntMass:Pt:RBC:Qn:Automated count: 32.7

## 2019-04-22 LAB — CHLORIDE: Chloride:SCnc:Pt:Ser/Plas:Qn:: 108 — ABNORMAL HIGH

## 2019-04-22 NOTE — Unmapped (Signed)
Infectious Disease ([facility]) Progress Note    Assessment & Plan:   Hayley Wagner is a 86 y.o. female with primary myelofibrosis (on Ruxolitinib 10 mg PO BID), CAD, thoracoabdominal aneurysm, HTN, cognitive dysfunction who presented to [facility] with Altered mental status, likely due to polypharmacy and zoster pain.    Principal Problem:    Altered mental status  Active Problems:    Primary myelofibrosis (CMS-HCC)    Shingles  Resolved Problems:    * No resolved hospital problems. *    AMS, possible polypharmacy: Pt's recent baseline is reportedly a/ox3 with mild neurocognitive disorder (SLUMS 22/30) with sundowning per [facility], pt's prior caretakers. She was transferred from SNF after having garbled speech, and has now had more confusion that normal with sundowning. Suspect some component of this is polypharmacy in the setting of treatment for zoster related pain. Head CT and Brain MRI w/o acute abnormality. CXR clear, UA with moderate bacteria but no symptoms of UTI when patient was clear. Blood cultures NGTD. Feel an infectious cause unlikely in the setting of normal white count and no fever. Carbamazepine level <3 on admission. TSH normal.  - Holding gabapentin, mirtazapine  - Carbamazepine 200 mg po bid per neuro for pain  - Delirium precautions  - Ok for no IVs  - Hope for improvement with resolution of zoster pain and holding medications  - Transfer to [facility] today  - Safety belt  ??  Zoster-related Pain: Rash and presentation was very consistent with V2-distribution shingles. 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 Pt was also complaining of eye pain on the same side as her rash, raising concern for ophthalmic involvement. Optho consulted and did not see evidence of that on physical exam but do want to see her in clinic after discharge.  - Carbamazepine as above  - Is s/p 7 day course of valtrex and has received 2 days of treatment here, will treat with valtrex for one more day and then discontinue  - Per optho: artificial tears QID and refresh PM at bedtime, erythromycin ophthalmic ointment directly on vesicular lesions, cold compresses, follow up with optho in 4-6 weeks in the outpatient setting  ??  Chronic medical conditions:  Myelofibrosis: Continue home ruxolitinib 10 mg twice daily    Daily Checklist:  Diet: Regular Diet  DVT PPx: Lovenox 40mg  q24h  Electrolytes: No Repletion Needed  Code Status: Full Code  Dispo: Transfer to [facility]    Subjective:   Overnight was delirious and tried to pull out IV. IV was removed. Continued to be confused this AM and is repeatedly trying to get out of bed. Melatonin given last night. Also heme recommended continuing on ruxolitinib while inpatient and agreed to place treatment plan orders this AM. Otherwise no acute events.    Objective:   Temp:  [36.9 ??C-37.1 ??C] 37.1 ??C  Heart Rate:  [82-93] 82  Resp:  [14] 14  BP: (140-164)/(58-62) 164/58  SpO2:  [94 %-96 %] 94 %, No intake or output data in the 24 hours ending 04/22/19 1418    Gen: Ill appearing woman in no apparent distress trying to get out of hospital bed  Eyes: No scleral icterus, conjugate gaze  Pulm: Clear to auscultation bilaterally  CVS: RRR no murmurs gallops rubs  Abd: Soft, non-tender, non-distended. No r/g  Ext: Wwp, no pretibial pitting edema  Neuro: A/ox0, moving all 4 extremities, CN II-XII grossly intact    Labs/Studies: Labs and Studies from the  last 24hrs per EMR and Reviewed

## 2019-04-22 NOTE — Unmapped (Signed)
VSS.  Patient confused during entirety of shift.  Patient attempted to get out of bed 20+ times during the night, MD notified multiple times.  Melatonin given with poor result, patient remained confused.  At start of shift, tele-sitter ordered and placed in room.  Patient pulled out IV, MD notified, asked for no PIV order.  Patient has some difficulty taking pills, but does better with applesauce. Will continue plan of care.     Problem: Skin Injury Risk Increased  Goal: Skin Health and Integrity  Outcome: Progressing  Intervention: Optimize Skin Protection  Flowsheets (Taken 04/22/2019 0358)  Pressure Reduction Techniques: frequent weight shift encouraged     Problem: Fall Injury Risk  Goal: Absence of Fall and Fall-Related Injury  Outcome: Progressing  Intervention: Identify and Manage Contributors to Fall Injury Risk  Flowsheets (Taken 04/22/2019 0358)  Medication Review/Management: medications reviewed     Problem: Adult Inpatient Plan of Care  Goal: Plan of Care Review  Outcome: Progressing  Goal: Patient-Specific Goal (Individualization)  Outcome: Progressing  Goal: Absence of Hospital-Acquired Illness or Injury  Outcome: Progressing  Intervention: Prevent Skin Injury  Flowsheets (Taken 04/22/2019 0358)  Pressure Reduction Techniques: frequent weight shift encouraged  Goal: Optimal Comfort and Wellbeing  Outcome: Progressing  Intervention: Monitor Pain and Promote Comfort  Flowsheets (Taken 04/22/2019 0358)  Pain Management Interventions:   care clustered   pillow support provided   quiet environment facilitated   position adjusted  Goal: Readiness for Transition of Care  Outcome: Progressing  Goal: Rounds/Family Conference  Outcome: Progressing     Problem: Self-Care Deficit  Goal: Improved Ability to Complete Activities of Daily Living  Outcome: Progressing     Problem: Heart Failure Comorbidity  Goal: Maintenance of Heart Failure Symptom Control  Outcome: Progressing     Problem: Hypertension Comorbidity Goal: Blood Pressure in Desired Range  Outcome: Progressing  Intervention: Maintain Hypertension-Management Strategies  Flowsheets (Taken 04/22/2019 0358)  Medication Review/Management: medications reviewed

## 2019-04-22 NOTE — Unmapped (Signed)
Ophthalmology Consult Note    Requesting Attending Physician: Rolm Bookbinder Hightow-weidman  Service Requesting Consult: Infectious Disease ([facility])   Consult Attending Physician: Dr. Cephus Shelling    86 y.o.??female??with primary myelofibrosis (on Ruxolitinib 10 mg PO BID), CAD, thoracoabdominal aneurysm, HTN,??cognitive dysfunction recent UTI??who presented to [facility] with Altered mental status.   Ophthalmology consulted given hx of zoster treated at [facility] and consistent electrical, pins and needles pain of affected region.    Herpes Zoster without Ocular Involvement  -Neg Hutchinson Sign at this time  -Vesicular lesions erupted in V2 distribution - at this time, largely crusted and healing  -No dendrites/pseudodendrites or staining of cornea  -DFE normal  -s/p 10 days valtrex 1gm TID per provider        Jearld Fenton DMEK/DSEK in past, pt does not wish to consider    PCIOL  Monitor    Assessment:  Pt has remote hx of zoster with valtrex treatment for 10 days. When asking pt about eye pain (chief complaint), she continuously points to her cheek and lips where previous vesicles arose from. Likely that her pain is from post-herpetic neuralgia given her daughter described it as pins and needles and electrical in nature. Pt too altered for subjective VA evaluation but eye exam and DFE wnl.     Recommendations:  -Started Artificial tears 4x daily OU and refresh PM qhs (ordered)  -Recommend erythromycin or bacitracin ophthalmic ointment TID directly on vesicular lesions  -No reason for further treatment with valtrex from ophthalmologic standpoint  -Please treat post-herpetic neuralgia with cold compress, gabapentin, pregabalin or other  -Recommend f/u 4-6 weeks in outpatient setting, please contact ophthalmology prior to discharge to arrange  ___________________  Ander Purpura, MD  Ophthalmology    [facility]   [address] [place], [place] [address]  P: [phone]  __________________________________________________________________ Reason for Consult:  Zoster    History of Present Illness:  Hayley Wagner is a 86 y.o. female whom we are asked to see in consultation for above. See above.      Hospital Problem List:  Patient Active Problem List   Diagnosis   ??? 2-vessel coronary artery disease   ??? Thoracoabdominal aneurysm (CMS-HCC)   ??? Chest pain, unspecified   ??? Chronic kidney disease, stage 3   ??? Descending thoracic aortic aneurysm (CMS-HCC)   ??? Ectatic thoracic aorta (CMS-HCC)   ??? Essential hypertension   ??? Hyperlipidemia   ??? IGT (impaired glucose tolerance)   ??? Increased frequency of urination   ??? Laxity of skin   ??? Palpitations   ??? Rectocele, female   ??? Rosacea   ??? Primary myelofibrosis (CMS-HCC)   ??? Altered mental status   ??? AKI (acute kidney injury) (CMS-HCC)   ??? UTI (urinary tract infection)   ??? Fall   ??? Shingles       History:  Past Ocular History:  - negative     Past Medical History:  Past Medical History:   Diagnosis Date   ??? CAD (coronary artery disease)    ??? Cancer (CMS-HCC)    ??? Hypertension        Past Surgical History:  No past surgical history on file.    Family History:  Negative family ocular history    Social History:  Social History     Socioeconomic History   ??? Marital status: Widowed     Spouse name: Not on file   ??? Number of children: Not on file   ???  Years of education: Not on file   ??? Highest education level: Not on file   Occupational History   ??? Not on file   Social Needs   ??? Financial resource strain: Not very hard   ??? Food insecurity     Worry: Never true     Inability: Never true   ??? Transportation needs     Medical: No     Non-medical: No   Tobacco Use   ??? Smoking status: Former Smoker   ??? Smokeless tobacco: Never Used   Substance and Sexual Activity   ??? Alcohol use: Yes   ??? Drug use: Not on file   ??? Sexual activity: Not on file   Lifestyle   ??? Physical activity     Days per week: Not on file     Minutes per session: Not on file   ??? Stress: Not on file   Relationships   ??? Social Wellsite geologist on phone: Not on file     Gets together: Not on file     Attends religious service: Not on file     Active member of club or organization: Not on file     Attends meetings of clubs or organizations: Not on file     Relationship status: Widowed   Other Topics Concern   ??? Not on file   Social History Narrative   ??? Not on file       -negative tobacco use.    Medications:  Scheduled Meds:   ??? acetaminophen  650 mg Oral Q8H   ??? aspirin  81 mg Oral Daily   ??? carBAMazepine  200 mg Oral BID   ??? carboxymethylcellulose sodium  1 drop Both Eyes 6XD   ??? enoxaparin (LOVENOX) injection  40 mg Subcutaneous Q24H   ??? famotidine  20 mg Oral BID   ??? pravastatin  40 mg Oral Nightly   ??? valACYclovir  1,000 mg Oral BID   ??? ocular lubricant  1 application Both Eyes Nightly     Continuous Infusions:   PRN Meds: lidocaine, lidocaine 4%    Allergies:  No Known Allergies    Review of Systems:  12 systems reviewed and negative unless otherwise stated in HPI or recent HPI    Physical Exam:  Vitals:    04/21/19 0125 04/21/19 0157 04/21/19 0516 04/21/19 1328   BP: 107/65 117/59 109/54 126/60   Pulse:  93 90 83   Resp:  14 14 16    Temp:  36.4 ??C 36.7 ??C 36.5 ??C   TempSrc:  Axillary Oral Oral   SpO2: 96% 97% 97% 100%   Weight:  53.8 kg (118 lb 8 oz)     Height:           General:   No acute distress    Neuro/Psych:  AMS    Ophthalmic Exam:  Base Eye Exam     Visual Acuity    UTO-mental status           Tonometry (Applanation, 5:12 PM)       Right Left    Pressure 12 12          Pupils       Pupils APD    Right PERRL None    Left PERRL None          Visual Fields    UTO-mental status             Extraocular Movement  UTO-mental status             Neuro/Psych     Mood/Affect: AMS          Dilation     Both eyes: 1% Tropicamide, 2.5% Phenylephrine @ 5:12 PM            Slit Lamp and Fundus Exam     External Exam       Right Left    External healing lesions v2 distribution Normal          Slit Lamp Exam       Right Left    Lids/Lashes Normal Normal Conjunctiva/Sclera White and quiet White and quiet    Cornea guttata, no frank edema, no pseudodendrites guttata, no frank edema, mild D folds, no pseudodendrites    Anterior Chamber Deep and quiet Deep and quiet    Iris Round and reactive Round and reactive    Lens pcIOL pcIOL    Vitreous Normal Normal          Fundus Exam       Right Left    Disc Normal Normal    C/D Ratio 0.2 0.2    Macula temporal hard drusen beyond arcades Normal    Vessels 1 flame hemorrhage Normal    Periphery Normal Normal                Diagnostic Testing:  No pertinent labs or images available for review.  _________________________________________________________________    Thank you for this consultation.  Please page on-call or consult resident with any questions.  [facility] may be reached at [phone].

## 2019-04-22 NOTE — Unmapped (Signed)
Pt VSS, pt is not as talkative or appropriately responsive today. Night shift reported she was up all night. Pt is being observed by Telesitter,  Her urine output is sufficient, she has not ate much today. She denies any pain. Pt is to transfer back to SNF today. POC will continue.        Problem: Skin Injury Risk Increased  Goal: Skin Health and Integrity  Outcome: Progressing     Problem: Fall Injury Risk  Goal: Absence of Fall and Fall-Related Injury  Outcome: Progressing     Problem: Adult Inpatient Plan of Care  Goal: Plan of Care Review  Outcome: Progressing  Goal: Patient-Specific Goal (Individualization)  Outcome: Progressing  Goal: Absence of Hospital-Acquired Illness or Injury  Outcome: Progressing  Goal: Optimal Comfort and Wellbeing  Outcome: Progressing  Goal: Readiness for Transition of Care  Outcome: Progressing  Goal: Rounds/Family Conference  Outcome: Progressing     Problem: Self-Care Deficit  Goal: Improved Ability to Complete Activities of Daily Living  Outcome: Progressing     Problem: Heart Failure Comorbidity  Goal: Maintenance of Heart Failure Symptom Control  Outcome: Progressing     Problem: Hypertension Comorbidity  Goal: Blood Pressure in Desired Range  Outcome: Progressing

## 2019-04-22 NOTE — Unmapped (Addendum)
Hayley Wagner??is a 86 y.o.??female??with primary myelofibrosis (on Ruxolitinib 10 mg PO BID), CAD, thoracoabdominal aneurysm, HTN,??cognitive dysfunction, and recent facial shingles infection who presented to [facility] with Altered mental status.  ????  AMS:??Patient with prior recent baseline a/ox3 with mild neurocognitive disorder was transferred from SNF after having garbled speech and confusion a few hours after receiving the 2nd covid vaccine. On initial evaluation she was mildly agitated, pulling at clothes, and refusing PO c/w delirium. Her delirium improved by holding psychotropic medications and escalating her pain regimen. Her delirium was most likely 2/2 polypharmacy (recently increased gabapentin, added carbamazepine and mirtazapine at SNF) confounded by inadequate pain control, and possibly an adverse reaction to the covid vaccine. VZV encephalitis encephalitis was considered, but given her lack of additional infectious symptoms and rapid improvement with pain control, LP was not ultimately pursued. She was resumed on carbamazepine BID with improvement in pain prior to discharge, and provided short course of oxycodone. Would be cautious with adding centrally acting pain medications like gabapentin given her propensity for delirium. At time of discharge, patient was alert, oriented x1, not agitated, following commands and answering most questions appropriately. Unfortunately, she may have lasting cognitive impairment from this persistent episode of delirium.   ??  Shingles, post-herpetic neuralgia:??Vesicular rash in the V2 distribution developed on 2/7; lesions were healing at time of admission but with persistent red crusting over right lip. She had persistent pain in the area, likely post-herpetic neuralgia. Unfortunately, treatment of pain was limited by her altered mental status, as she tried gabapentin and carbamazepine with worsening confusion.??She was evaluated by ophthalmology??on??2/26 - recommended artificial tears, erythromycin/bacitracin ophthalmic ointment, and outpatient follow up. Heme/onc was following this patient, recommended continuing prophylactic valtrex 500mg  daily.   ??  Primary myelofibrosis: Diagnosed in 04/2018. Follows with Dr. Anise Wagner [facility] Hem/Onc). Initially treated with hydroxyurea but then transitioned to ruxolitinib in 12/2018. Ruxolitinib 10 mg twice daily was continued while inpatient, although patient refused many doses. She was scheduled for outpatient follow up (appt with Dr. Anise Wagner on 3/22)    ??  -- Please recheck CBC with differential in around 2 weeks, as patient is on Jakafi for myelofibrosis, per her oncology team.   -- Please continue to monitor the resolution of her likely shingles infection on the right side of her face.   -- Patient scored 22/30 on SLUMS examination. She had continued mild delirium in the hospital, but please further monitor her cognitive status.  ??   To contact the discharging attending physician with regard to the patient's hospitalization or follow-up needs, please call: [facility] Division of Geriatrics [phone]).   ??    GERIATRIC ASSESSMENT:    [name] is an 86 yo female with the recent presentation of delirium in the setting of post-herpetic neuralgia.  She has previously scored a 22/30 (03/2019) in her SLUMS assessment, indicating mild underlying neurocognitive impairment. She previously lived in [place] by herself with a paid caregiver who arrives for 4 hours a day to assist with chores, meal preparation, and driving her to her appointments.   ??  She has a doctorate in music therapy. She was previously married, but lives alone now after her husband died at the age of 51.     Functional Assessment       ADLs:  IADLs:   Feeding: Requires Assistance  Dressing: Requires Assistance  Ambulation: Requires Assistance  Toileting: Requires Assistance  Bathing: Requires Assistance   Using the phone: Dependent  Shopping: Dependent  Meal  preparation: Dependent  Medication mgmt: Dependent  Managing finances: Dependent  Housework: Dependent  Transportation (driving or navigating public transit): Dependent     Living situation: Patient lives in own home with noone (lives alone).     Changes in ADLs during hospitalization: Yes      Cognitive Assessment     Delirium Assessment: CAM (Confusion Assessment Method):    On discharge, the patient DID show evidence of delerium. Discharging mental status: A&Ox1.      Other cognitive assessment:  The patient was unable to participate in formal mental status screening exams due to delirium.    Advance Care Planning     .hosp      Code Status: Full Code  During previous recent admission in February, it was stated- there was some conflicting information and likely misunderstanding (due to circumstances in the hospital, and need to double mask) re: what her goals of care are including Advanced Care Planning.   At that time, she stated that she would like full course of resuscitation should her heart rhythm changes so it is not compatible with life or if she stops breathing.   She also stated that she previously talked to her PCP (an NP at [facility]) that she would like a natural death and not be kept alive by artificial means.   At that time, stressed that once out of the hospital she should follow up with PCP and makes sure her wishes are clearer and that she revisit this issue. She will need to revisit this conversation once her mental status has improved.     Code Status: Full Code   Health Care Decision Maker as of 04/01/2019   ??  Surrogate decision maker: Hayley Wagner (son)   Emergency Contact:   Extended Emergency Contact Information   Primary Emergency Contact: Hayley Wagner   Mobile Phone: [phone]   Relation: Son   Secondary Emergency Contact: Hayley Wagner   Mobile Phone: [phone]   Relation: Daughter in [place]   Preferred language: ENGLISH   Interpreter needed? No

## 2019-04-23 NOTE — Unmapped (Signed)
Problem: Fall Injury Risk  Goal: Absence of Fall and Fall-Related Injury  Outcome: Progressing   VSS. Drowsy most of the shift. Woke to take medications. Slept well. Bed alarm and lap belt on for safety. Bed low, call bell within reach.

## 2019-04-23 NOTE — Unmapped (Signed)
Geriatrics ([facility]) Transfer Summary and Progress Note    Transfer Summary:   Hayley Wagner is an 86 year old female with CAD, HTN, GERD, thoracoabdominal aneurysm, primary myelofibrosis on ruxolitinib, and mild neurocognitive disorder who was admitted to [facility] for altered mental status on 04/21/19.    Patient was recently admitted to [facility] from 03/31/19-04/07/19 for delirium related to UTI and also developed painful lesions in the V2 distribution on the right side of her face consistent with shingles. She was started on valtrex and gabapentin for shingles and discharged on a course of keflex for the UTI. A SLUMS exam was completed with a score of 22/30, indicative of mild neurocognitive impairment. By discharge to the [facility], her delirium had significantly improved but not completely resolved. She initially did well at SNF. She did have increased right facial pain at the location of her shingles, so her gabapentin was increased from 100 mg nightly to 100 mg three times daily and she was started on mirtazapine 7.5 mg nightly. The morning of admission on 2/5, she was reportedly at her baseline. She received a dose of carbamazepine (a new medication for her, presumably given for post-herpetic neuralgia) and then left the SNF to receive a COVID vaccination. On return, she was very altered and speaking unintelligibly so EMS was called.    On arrival to the ED, there was initially concern for stroke. CT head showed no acute hemorrhage or infarct, and MRI brain showed small vessel ischemic changes and diffuse prominence of the CSF containing spaces consistent with age-related and involutional changes but no acute intracranial findings. She was initially started on cefepime and acyclovir empirically for a possible infectious encephalitis, but these were stopped after neurology evaluated and felt that her presentation was not consistent with meningitis or encephalitis. Additional work up to evaluate underlying infection including CXR and urinalysis have been unremarkable, and she has had no fever or leukocytosis. Her altered mental status was presumed to be secondary to delirium; the mirtazapine and gabapentin were held but the carbamazepine continued. She was going to discharge back to SNF today, but, given ongoing concerns about her mental status, she has instead been transferred to our service for further evaluation.    On arrival to [facility], patient is minimally communicative. She is able to answer yes/no questions and denies fever, chills, neck pain, headache, chest pain, shortness of breath, cough, abdominal pain, nausea, diarrhea, constipation, and urinary symptoms. She is able to tell me her name but is not oriented to time or place.    Assessment/Plan:  Principal Problem:    Altered mental status  Active Problems:    2-vessel coronary artery disease    Hyperlipidemia    Primary myelofibrosis (CMS-HCC)    Shingles    GERD (gastroesophageal reflux disease)  Resolved Problems:    * No resolved hospital problems. *    Hayley Wagner is an 86 y.o. female with CAD, HTN, thoracoabdominal aneurysm, primary myelofibrosis on ruxolitinib, and mild neurocognitive disorder who was admitted to [facility] for altered mental status.    Altered mental status: I find delirium and medication side effect in the setting of mild neurocognitive disorder to be most likely - the AMS has been waxing/waning and seemingly started after initiation of carbamazepine on 2/5 (which has been continued up until this point). However, I do think it is important to consider VZV meningoencephalitis given that the confusion started within a couple weeks of developing an acute shingles infection and she  is relatively immunocompromised with her ruxolitinib therapy. I do find this much less likely given the apparent absence of fever, neck pain, headache, nausea, seizures, focal neurologic findings, and abnormalities on MRI - but it is still possible and could be evaluated with a lumbar puncture. Will stop valtrex at this point given that she has completed a course for shingles and it has been associated with delirium (though it is difficult to tell if the delirium is caused by the drug itself or the infections that it treats); if VZV encephalitis is identified, she will need treatment with IV acyclovir.  -stop carbamazepine  -stop valtrex  -continue to hold gabapentin and mirtazepine  -consider lumbar puncture (would obtain cell count, protein, glucose, HSV PCR, and VZV PCR)  -melatonin 3 mg nightly  -delirium precautions    Shingles, post-herpetic neuralgia: Vesicular rash in the V2 distribution developed on 2/7; lesions healing but with persistent red crusting over right lip. Has had persistent pain in the area, likely post-herpetic neuralgia. Unfortunately, treatment of pain is limited by her altered mental status. Has tried gabapentin and carbamazepine with worsening confusion. Evaluated by ophthalmology on 2/26 - recommended artificial tears, erythromycin/bacitracin ophthalmic ointment, and outpatient follow up.  -stop valtrex as above  -increase tylenol to 1000 mg every 8 hours  -consider ibuprofen for breakthrough pain if needed (reported history of CKD but creatinine normal here)  -bacitracin ointment to lesions three times daily  -artificial tears six times daily  -ophtho follow up appointment in 4-6 weeks (page ophtho on discharge to arrange)  ??  Mild AST elevation: Patient developed elevation in AST and ALT after starting valtrex. Now recovering.  -stop valtrex as above  -morning CMP    Primary myelofibrosis: Diagnosed in 04/2018. Follows with Dr. [name] [facility] Hem/Onc). Initially treated with hydroxyurea but then transitioned to ruxolitinib in 12/2018.  -hematology following  -ruxolitinib 10 mg twice daily  -daily CBC w/ diff  -outpatient follow up (appt with Dr. [name] on 3/22)    Chronic medical conditions  CAD/HLD: Home aspirin and pravastatin  GERD: Home pepcid 20 mg twice daily    Cognitive Assessment:  CAM (Confusion Assessment Method): Positive for evidence of delirium.  Other Cognitive Assessment: SLUMS exam performed during recent hospitalization with score of 22/30 (mild neurocognitive disorder).    Code Status: Full code as of now. Per previous discharge summary (04/07/19), patient has given conflicting preferences on this. She has stated that she would like full course of resuscitation but has also reported talking to her PCP ([facility]) about how she would like a natural death and not be kept alive by artificial means. Recommended on last discharge that she follow up with PCP to clarify goals. I agree that goals need to be clarified once patient's mental status improves.  Surrogate Decision Maker: Patient does not currently have the capacity to make her own healthcare decisions. She is unable to designate a surrogate decision maker. Her son Ingris Pasquarella [phone]) is listed as an emergency contact in her chart and would be her next-of-kin.  DVT Prophylaxis: Lovenox subq  Disposition: Transfer to [facility]. Discharge pending AMS evaluation and SNF placement.  ___________________________________________________________________    Living Situation:  Per discharge summary (04/07/19): Patient lives in own home in [place]. She has two close friends who check in with her as well as a paid caregiver who comes in 4 hours a day for assistance with chores and meal preparation, transportation to appointments, and transportation for shopping.     ADLs:  Feeding: Independent  Dressing: Independent  Ambulation: Independent  Toileting: Independent  Bathing: Independent    IADLs:  Using the telephone: Independent  Shopping: Requires Assistance  Meal preparation: Independent  Medication management: Independent  Managing finances: Requires Assistance  Housework: Requires Presenter, broadcasting (driving or navigating public transit): Dependent     Assistive Devices: None    Labs/Studies:  Labs and studies from the last 24 hours were reviewed in EMR.    Physical Exam:  Vitals:    04/22/19 2116   BP: 158/82   Pulse: 84   Resp: 16   Temp: 36.3 ??C   SpO2: 97%     General: Eyes closed. Comfortable. Lying in bed on left side hugging busy belt. In no acute distress.  Skin: Warm and dry. No rashes.  Head and Neck: Neck supple.  Eyes: Conjunctivae and lids normal. Pupils equal and reactive to light and accomodation.  Cardiovascular: Regular rate and rhythm. No murmurs, rubs, or gallops.  Pulmonary: Normal work of breathing without use of accessory muscles. Lung fields clear to auscultation bilaterally. No crackles or wheezes.  Gastrointestinal: Normal bowel sounds. Abdomen soft and non-distended. No tenderness.  Extremities: No peripheral edema.  Neurologic: Interactive but with eyes closed. Oriented to self only. Will attempt to follow directions but unable to complete tasks as instructed (ex: will hold my hand when I try to test grip strength but will not squeeze when I ask). Therefore unable to fully test cranial nerves, range of motion, or strength. Moves all 4 extremities. 2+ reflexes throughout.    Henry Russel, MD  Internal Medicine PGY-2

## 2019-04-23 NOTE — Unmapped (Signed)
Geriatrics ([facility]) Progress Note    Assessment & Plan:   Hayley Wagner??is a 86 y.o.??female??with primary myelofibrosis (on Ruxolitinib 10 mg PO BID), CAD, thoracoabdominal aneurysm, HTN,??cognitive dysfunction??who presented to [facility] with Altered mental status, likely due to polypharmacy and zoster pain.    Principal Problem:    Altered mental status  Active Problems:    2-vessel coronary artery disease    Hyperlipidemia    Primary myelofibrosis (CMS-HCC)    Shingles    GERD (gastroesophageal reflux disease)  Resolved Problems:    * No resolved hospital problems. *    AMS:??Pt's recent baseline is a/ox3 with mild neurocognitive disorder (SLUMS 22/30) with sundowning based on previous, recent admission. She was transferred from SNF after having garbled speech and confusion. Suspect some component of this is polypharmacy in the setting of treatment for zoster related pain - recently increased gabapentin, added carbamazepine and mirtazapine at SNF. Also notable, patient received 2nd dose of covid vaccine on day of symptom onset, so this could also be an adverse reaction to the vaccine. Still, given recent shingles infection in V2-distribution in this immunocompromised patient, I have some concern for VZV encephalitis and will need LP to rule out.   - Holding gabapentin, mirtazapine, carbamazepine   - Plan for LP tomorrow (obtain cell count, protein, glucose HSV and VZV PCR)  - Delirium precautions  ??  Shingles, post-herpetic neuralgia: Vesicular rash in the V2 distribution developed on 2/7; lesions healing but with persistent red crusting over right lip. Has had persistent pain in the area, likely post-herpetic neuralgia. Unfortunately, treatment of pain is limited by her altered mental status. Has tried gabapentin and carbamazepine with worsening confusion. Evaluated by ophthalmology on 2/26 - recommended artificial tears, erythromycin/bacitracin ophthalmic ointment, and outpatient follow up.  -stop valtrex -Continue tylenol to 1000 mg every 8 hours (with PR backup if not taking PO and appears in pain)  -consider ibuprofen for breakthrough pain if needed (reported history of CKD but creatinine normal here)  -bacitracin ointment to lesions three times daily  -artificial tears six times daily  -ophtho follow up appointment in 4-6 weeks (page ophtho on discharge to arrange)  ??  Mild AST elevation:??Patient developed elevation in AST and ALT after starting valtrex. Now recovering.  -stop valtrex as above  -morning CMP  ??  Primary myelofibrosis: Diagnosed in 04/2018. Follows with Dr. [name] [facility] Hem/Onc). Initially treated with hydroxyurea but then transitioned to ruxolitinib in 12/2018.  -hematology following  -ruxolitinib 10 mg twice daily  -daily CBC w/ diff  -outpatient follow up (appt with Dr. [name] on 3/22)  ??  Chronic medical conditions  CAD/HLD: Home aspirin and pravastatin  GERD: Home pepcid 20 mg twice daily  Daily Checklist:  Diet: Regular Diet  DVT PPx: Lovenox 40mg  q24h (hold in AM for LP)  Electrolytes: No Repletion Needed  Code Status: Full Code  Dispo: likely SNF, pending AMS workup     Subjective:   No acute events overnight. This AM patient curled in fetal position, asleep but arousable. Follows some commands intermittently but remains altered. Appeared in pain per nursing, but would not eat and was refusing meds.     Objective:   Temp:  [36.3 ??C-37.1 ??C] 37.1 ??C  Heart Rate:  [53-84] 80  Resp:  [16-18] 18  BP: (145-166)/(72-88) 145/88  SpO2:  [90 %-97 %] 96 %    Gen: chronically ill appearing elderly female curled up in bed   HEENT: crusted lesion to lateral R lip, resisted  opening eyes for exam   Heart: RRR, S1, S2, no M/R/G, no chest wall tenderness  Lungs: CTAB, no crackles or wheezes, no use of accessory muscles  Abdomen: Normoactive bowel sounds, soft, NTND, no rebound/guarding  Extremities: no clubbing, cyanosis, or edema  Neuro: awake, following some commands (wiggle toes, squeeze hands), moving all 4 extremities. Hand strength symmetric.     Labs/Studies: Labs and Studies from the last 24hrs per EMR and Reviewed

## 2019-04-23 NOTE — Unmapped (Signed)
Malignant Hematology Consult Note    Requesting Attending Physician :  Milana Kidney, MD  Service Requesting Consult : Geriatrics (MDA)  Reason for Consult: Ruxolitinib dosing while admitted.  Primary Oncologist: Porfirio Oar, MD    Assessment: Hayley Wagner is a 86 y.o. woman with a PMHx of primary myelofibrosis (on Ruxolitinib 10 mg PO BID), CAD, thoracoabdominal aneurysm, HTN, and recent admission for AMS due to a UTI with admission c/b by zoster and delirium who was re-admitted with acute AMS.  Malignant Hematology was consulted for recommendations regarding continued Ruxolitinib therapy while admitted.    As above, pt was admitted with acute AMS and continues to have waxing and waning delirium.  Her AMS is thought to be related to polypharmacy and pain related to recent zoster infection in setting of an underlying mild neurocognitive disorder.  With regard to her history of primary myelofibrosis, she was initially diagnosed in 04/2018 and follows with Dr. [name] here at [facility].  She was initially treated with hydroxyurea but then transitioned to ruxolitinib in 12/2018.  She was started on ruxolitinib 20 mg BID, but then dose reduced to 10 mg BID in 01/2019, and has since been tolerating well.  Her treatment was briefly held while admitted in early February, but was restarted at time of discharge on 2/12.  At present she remains afebrile and hemodynamically stable.  She has no signs of an active zoster infection.  Her blood counts have also remained stable.  Her Hgb is in the 8s, but this is improved from earlier in the month, has remained stable, and she has not required recent transfusion support.  Therefore, there is no contraindication to continuing Ruxolitinib at this time and has been re-ordered by [place].    Recommendations:   - Continue Ruxolitinib 10 mg BID  - Obtain daily CBC w/diff while admitted  - Pt currently has follow-up with Dr. [name] on 05/15/19 and will message Dr. [name] to let her know about current admission.    This patient has been staffed with Dr. Sheryn Bison. These recommendations were discussed with the primary team.    Please contact the malignant hematology fellow at [phone] with any further questions.    Doree Barthel, MD  Hematology-Oncology Fellow    -------------------------------------------------------------    HPI: Hayley Wagner is a 86 y.o. woman with a PMHx of primary myelofibrosis (on Ruxolitinib 10 mg PO BID), CAD, thoracoabdominal aneurysm, HTN, and recent admission for AMS due to a UTI with admission c/b by zoster and delirium who was re-admitted with AMS.  Malignant Hematology was consulted for recommendations regarding continued Ruxolitinib therapy while admitted.    With regard to her recent admission in early February, she was treated with ceftriaxone/keflex for her UTI.  As above, her course was complicated by shingles (in the right V2 distribution) and she was treated with valtrex for 7 days (appears was not continued on prophylactic dosing).  Her lesions were already reportedly crusting over at time of discharge.  Her course was also complicated by waxing and waning delirium which reportedly was improving at time of discharge, although had not resolved.    With regard to her current admission she was brought in with acute AMS, which developed after receiving her second COVID vaccine (she also notably became altered/delirious after her first COVID vaccine injection).  Currently the thought is her acute AMS is related to polypharmacy (increased dose of gabapentin, mirtazapine, and recent initiation of carbamazepine) and pian related to post herpetic neuralgia in  the setting of likely baseline mild neurocognitive disorder.  She has remained afebrile and hemodynamically stable.  Labs notable for Hgb in the 8s, although this is improved from earlier in the month and has remained stable.  She has been endorsing right eye pain, was evaluated by Ophthalmology without concerning findings related to her recent zoster infection.  Furthermore, she had a CT Head and MRI brian with no acute findings.  She has been evaluated by Neurology who recommended carbamazepine for post herpetic pain.  There is no report of active zoster infection, but has been restarted on treatment dose valtrex.  On assessment she remains delirious and unable to participate in assessment, but does not appear to be in distress.    With regard to her history of primary myelofibrosis, she was diagnosed in 04/2018, follows with Dr. [name] here at [facility].  She was initially treated with hydroxyurea but then transitioned to ruxolitinib in 12/2018.  She was started on ruxolitinib 20 mg BID, but then dose reduced to 10 mg BID in 01/2019 and has since been tolerating well.  Her treatment was briefly held while admitted in early February, but was restarted on discharge (on 2/12).  As above her her counts remain stable and has not required recent transfusion support.    Review of Systems: Review of Systems - Unable to obtain secondary to AMS.    Oncologic History:  Oncology History   Primary myelofibrosis (CMS-HCC)   04/24/2018 Initial Diagnosis    Primary myelofibrosis (CMS-HCC)     04/22/2019 -  Chemotherapy    IP Ruxolitinib  [No description for this plan]       Past Medical History:   Diagnosis Date   ??? CAD (coronary artery disease)    ??? Cancer (CMS-HCC)    ??? Hypertension      Social History     Socioeconomic History   ??? Marital status: Widowed     Spouse name: Not on file   ??? Number of children: Not on file   ??? Years of education: Not on file   ??? Highest education level: Not on file   Occupational History   ??? Not on file   Social Needs   ??? Financial resource strain: Not very hard   ??? Food insecurity     Worry: Never true     Inability: Never true   ??? Transportation needs     Medical: No     Non-medical: No   Tobacco Use   ??? Smoking status: Former Smoker   ??? Smokeless tobacco: Never Used   Substance and Sexual Activity   ??? Alcohol use: Yes   ??? Drug use: Not on file   ??? Sexual activity: Not on file   Lifestyle   ??? Physical activity     Days per week: Not on file     Minutes per session: Not on file   ??? Stress: Not on file   Relationships   ??? Social Wellsite geologist on phone: Not on file     Gets together: Not on file     Attends religious service: Not on file     Active member of club or organization: Not on file     Attends meetings of clubs or organizations: Not on file     Relationship status: Widowed   Other Topics Concern   ??? Not on file   Social History Narrative   ??? Not on file     Social  History     Social History Narrative   ??? Not on file     Allergies: has No Known Allergies.    Medications:   Meds:  ??? acetaminophen  650 mg Oral Q8H   ??? aspirin  81 mg Oral Daily   ??? bacitracin   Topical TID   ??? carBAMazepine  200 mg Oral BID   ??? carboxymethylcellulose sodium  1 drop Both Eyes 6XD   ??? enoxaparin (LOVENOX) injection  40 mg Subcutaneous Q24H   ??? famotidine  20 mg Oral BID   ??? melatonin  3 mg Oral QPM   ??? pravastatin  40 mg Oral Nightly   ??? ruxolitinib  10 mg Oral BID   ??? valACYclovir  1,000 mg Oral BID   ??? ocular lubricant  1 application Both Eyes Nightly     Continuous Infusions:  ??? IP okay to treat     ??? sodium chloride       PRN Meds:.diphenhydrAMINE, EPINEPHrine IM, famotidine (PEPCID) IV, IP okay to treat, lidocaine, lidocaine 4%, meperidine, methylPREDNISolone sodium succinate (PF), sodium chloride, sodium chloride 0.9%    Objective:   Vitals: Temp:  [36.8 ??C (98.2 ??F)-37.1 ??C (98.8 ??F)] 36.8 ??C (98.2 ??F)  Heart Rate:  [69-93] 69  Resp:  [14-16] 16  BP: (124-164)/(57-62) 124/57  MAP (mmHg):  [82-89] 82  SpO2:  [94 %-96 %] 95 %    Physical Exam:  BP 124/57  - Pulse 69  - Temp 36.8 ??C (98.2 ??F) (Axillary)  - Resp 16  - Ht 144.8 cm (4' 9)  - Wt 53.8 kg (118 lb 8 oz)  - SpO2 95%  - BMI 25.64 kg/m??    General appearance - Altered but in NAD, frail appearing.  Neuro/Mental status - Delirious, moving all extremities, intermittently talking although nonsensical, trying to get out of bed.  Eyes - Unable to exam as pt would not open her eyes (previously seen by Ophthalmology)  Mouth/Lips - raw right upper lip skin lesion, although no vesicles seen in her mouth or in the right V2 distribution.  Slight/faint right-sided nasal/cheek erythema.  Pulmonary - no accessory muscle use or signs of respiratory distress.  Extremities - no LE edema  Skin - See above for facial exam, otherwise no visible rashes    Test Results  Recent Labs     04/20/19  1714 04/21/19  0902 04/22/19  0455   WBC 8.3 8.7 8.2   NEUTROABS 5.5 6.3 5.7   HGB 8.7* 8.7* 8.4*   PLT 336 354 329     Imaging: Radiology studies were personally reviewed

## 2019-04-24 LAB — COMPREHENSIVE METABOLIC PANEL
ALBUMIN: 3.9 g/dL (ref 3.5–5.0)
ALKALINE PHOSPHATASE: 83 U/L (ref 38–126)
ANION GAP: 12 mmol/L (ref 7–15)
AST (SGOT): 48 U/L — ABNORMAL HIGH (ref 14–38)
BILIRUBIN TOTAL: 1 mg/dL (ref 0.0–1.2)
BLOOD UREA NITROGEN: 23 mg/dL — ABNORMAL HIGH (ref 7–21)
BUN / CREAT RATIO: 33
CHLORIDE: 108 mmol/L — ABNORMAL HIGH (ref 98–107)
CO2: 26 mmol/L (ref 22.0–30.0)
CREATININE: 0.69 mg/dL (ref 0.60–1.00)
EGFR CKD-EPI AA FEMALE: 90 mL/min/{1.73_m2} (ref >=60)
EGFR CKD-EPI NON-AA FEMALE: 78 mL/min/{1.73_m2} (ref >=60)
GLUCOSE RANDOM: 130 mg/dL (ref 70–179)
POTASSIUM: 3.5 mmol/L (ref 3.5–5.0)
PROTEIN TOTAL: 6.7 g/dL (ref 6.5–8.3)
SODIUM: 146 mmol/L — ABNORMAL HIGH (ref 135–145)

## 2019-04-24 LAB — CBC W/ AUTO DIFF
BASOPHILS RELATIVE PERCENT: 1.7 %
EOSINOPHILS ABSOLUTE COUNT: 0 10*9/L (ref 0.0–0.7)
EOSINOPHILS RELATIVE PERCENT: 0.5 %
HEMATOCRIT: 26.4 % — ABNORMAL LOW (ref 35.0–44.0)
HEMOGLOBIN: 8.9 g/dL — ABNORMAL LOW (ref 12.0–15.5)
LYMPHOCYTES ABSOLUTE COUNT: 1.2 10*9/L (ref 0.7–4.0)
LYMPHOCYTES RELATIVE PERCENT: 15.5 %
MEAN CORPUSCULAR HEMOGLOBIN CONC: 33.6 g/dL (ref 30.0–36.0)
MEAN CORPUSCULAR HEMOGLOBIN: 32.4 pg (ref 26.0–34.0)
MEAN PLATELET VOLUME: 9.1 fL (ref 7.0–10.0)
MONOCYTES ABSOLUTE COUNT: 1.1 10*9/L — ABNORMAL HIGH (ref 0.1–1.0)
MONOCYTES RELATIVE PERCENT: 14.4 %
NEUTROPHILS ABSOLUTE COUNT: 5.4 10*9/L (ref 1.7–7.7)
NEUTROPHILS RELATIVE PERCENT: 67.9 %
PLATELET COUNT: 352 10*9/L (ref 150–450)
RED BLOOD CELL COUNT: 2.74 10*12/L — ABNORMAL LOW (ref 3.90–5.03)
RED CELL DISTRIBUTION WIDTH: 29.3 % — ABNORMAL HIGH (ref 12.0–15.0)
WBC ADJUSTED: 7.9 10*9/L (ref 3.5–10.5)

## 2019-04-24 LAB — MONOCYTES RELATIVE PERCENT: Monocytes/100 leukocytes:NFr:Pt:Bld:Qn:Automated count: 14.4

## 2019-04-24 LAB — ANION GAP: Anion gap 3:SCnc:Pt:Ser/Plas:Qn:: 12

## 2019-04-24 NOTE — Unmapped (Signed)
Geriatrics (MEDA) Progress Note    Assessment & Plan:   Hayley Wagner??is a 86 y.o.??female??with primary myelofibrosis (on Ruxolitinib 10 mg PO BID), CAD, thoracoabdominal aneurysm, HTN,??cognitive dysfunction??who presented to [facility] with Altered mental status, likely due to polypharmacy and zoster pain.    Principal Problem:    Altered mental status  Active Problems:    2-vessel coronary artery disease    Hyperlipidemia    Primary myelofibrosis (CMS-HCC)    Shingles    GERD (gastroesophageal reflux disease)  Resolved Problems:    * No resolved hospital problems. *    AMS:??Pt's recent baseline is a/ox3 with mild neurocognitive disorder (SLUMS 22/30) with sundowning based on previous, recent admission. She was transferred from SNF after having garbled speech and confusion. Suspect some component of this is polypharmacy in the setting of treatment for zoster related pain - recently increased gabapentin, added carbamazepine and mirtazapine at SNF. Also notable, patient received 2nd dose of covid vaccine on day of symptom onset, so this could also be an adverse reaction to the vaccine. Still, given recent shingles infection in V2-distribution in this immunocompromised patient, I have some concern for VZV encephalitis and will need LP to rule out. Attempted to do LP today, but unsuccessful- will consult VIR in AM.   - Holding gabapentin, mirtazapine, carbamazepine   - Plan for LP tomorrow by VIR (obtain cell count, protein, glucose HSV and VZV PCR)  - Delirium precautions  ??  Shingles, post-herpetic neuralgia: Vesicular rash in the V2 distribution developed on 2/7; lesions healing but with persistent red crusting over right lip. Has had persistent pain in the area, likely post-herpetic neuralgia. Unfortunately, treatment of pain is limited by her altered mental status. Has tried gabapentin and carbamazepine with worsening confusion. Evaluated by ophthalmology on 2/26 - recommended artificial tears, erythromycin/bacitracin ophthalmic ointment, and outpatient follow up.  -Per heme/onc- patient needs valtrex for prophylaxis, unfortunately she is not taking PO right now, so started equivalent dose of IV acyclovir per pharmacy recs   -Tylenol 650mg  q6hrs PR, will switch to PO once taking   -bacitracin ointment to lesions three times daily  -artificial tears six times daily  -ophtho follow up appointment in 4-6 weeks (page ophtho on discharge to arrange)  ??  Primary myelofibrosis: Diagnosed in 04/2018. Follows with Dr. [name] [facility]). Initially treated with hydroxyurea but then transitioned to ruxolitinib in 12/2018.  -hematology following  -ruxolitinib 10 mg twice daily (not taking by PO currently)  -daily CBC w/ diff  -outpatient follow up (appt with Dr. [name] on 3/22)    Mild AST elevation:??Patient developed elevation in AST and ALT after starting valtrex. Now recovering.  -morning CMP  ??  Chronic medical conditions  CAD/HLD: Home aspirin and pravastatin  GERD: Home pepcid 20 mg twice daily  Daily Checklist:  Diet: Regular Diet  DVT PPx: Lovenox 40mg  q24h (hold in AM for LP)  Electrolytes: No Repletion Needed  Code Status: Full Code  Dispo: likely SNF, pending AMS workup     Subjective:   No acute events overnight. Patient more awake and responsive this AM, although still not answering questions. Does follow some commands. Attempted LP today, but unable to get. Obtained IV access.     Objective:   Temp:  [36.4 ??C-36.5 ??C] 36.4 ??C  Heart Rate:  [81-91] 81  Resp:  [18] 18  BP: (152-163)/(67-78) 154/67  SpO2:  [94 %-95 %] 95 %    Gen: chronically ill appearing elderly female curled up in  bed   HEENT: crusted lesion to lateral R lip, resisted opening eyes for exam   Heart: RRR, S1, S2, no M/R/G, no chest wall tenderness  Lungs: CTAB, no crackles or wheezes, no use of accessory muscles  Abdomen: Normoactive bowel sounds, soft, NTND, no rebound/guarding  Extremities: no clubbing, cyanosis, or edema  Neuro: awake, following some commands, moving all extremities     Labs/Studies: Labs and Studies from the last 24hrs per EMR and Reviewed

## 2019-04-24 NOTE — Unmapped (Signed)
Lumbar Puncture Procedure Note (CPT 62270)    Pre-procedural Planning     Patient Name:: Maralyn Witherell Gries  Patient MRN: [id number]    Pre-operative Diagnosis:  Altered mental status    Post-operative Diagnosis: Altered mental status    Indications:  Altered mental status    Contraindications to performing lumbar puncture: Patient had no contraindications. Specifically, no local skin infections over the proposed puncture site, known elevated ICP other than in idiopathic intracranial hypertension or cryptococcal meningitis, and/or an uncontrolled bleeding diathesis.     Imaging prior to lumbar puncture: Generally, imaging should be obtained prior to performing a lumbar puncture if the patient is > 66 years old, immunocompromised, has had a seizure within one week of presentation, has an abnormal level of consciousness, or an abnormal neurologic exam (including papilledema). CNS imaging has been obtained and does not indicate increased ICP.    Known Bleeding Diathesis: Patient/caregiver denies any known bleeding or platelet disorder.     Antiplatelet Agents: This patient is not on an antiplatelet agent.    Systemic Anticoagulation: This patient is not on full systemic anticoagulation.    Significant Labs:  INR   Date Value Ref Range Status   04/20/2019 1.12  Final     PT   Date Value Ref Range Status   04/20/2019 13.2 10.5 - 13.5 sec Final     Platelet   Date Value Ref Range Status   04/24/2019 352 150 - 450 10*9/L Final       Consent: Informed consent was obtained after explanation of the risks (including headache, backache, bleeding that could result in paralysis, and infection) and benefits of the procedure. Refer to the consent documentation.    Procedure Details     Time-out performed immediately prior to the procedure.    Patient was placed in the left lateral decubitus position with hips and neck in flexion.    The superior aspect of the iliac crests were identified, with the traverse demarcating the L4-L5 interspace.  A bedside ultrasound was not used to help identify the spinous processes at L4 and L5 and the intervertebral spaces were located and marked.  This area was prepped with Chloraprep and draped in the usual sterile fashion. Sterile technique was used including antiseptics, cap, gloves, hand hygiene, mask, and sterile sheet.  Local anesthesia with 1% lidocaine was applied subcutaneously then deep to the skin. The spinal needle with trocar was introduced at the L4 - L5 interspace.  CSF was not obtained at this space.  4 total needle passes made.  Recommend LP under fluoroscopy.  The spinal needle with trocar was removed, with minimal bleeding noted upon removal. A sterile bandage was placed over the puncture site after holding pressure.    Findings     NA          Condition     The patient tolerated the procedure well and remains in the same condition as pre-procedure.    Complications and Recommendations     The patient was unable to tolerate beside lumbar puncture. Recommend LP under fluoro.      Requesting Service: Geriatrics (MDA)    Time Requested: 1100  Time Completed: 1400  Comments: none    Resident(s) Performing Procedure: Evorn Gong, Robb Matar and Silas [place]    Resident Year: Yehuda Mao

## 2019-04-24 NOTE — Unmapped (Signed)
Malignant Hematology Consult Note    Requesting Attending Physician :  Milana Kidney, MD  Service Requesting Consult : Geriatrics (MDA)  Reason for Consult: Ruxolitinib dosing while admitted.  Primary Oncologist: Porfirio Oar, MD    Assessment: Hayley Wagner is a 86 y.o. woman with a PMHx of primary myelofibrosis (on Ruxolitinib 10 mg PO BID), CAD, thoracoabdominal aneurysm, HTN, and recent admission for AMS due to a UTI with admission c/b by zoster and delirium who was re-admitted with acute encephalopathy.  Malignant Hematology was consulted for recommendations regarding continued Ruxolitinib therapy while admitted.    Patient was admitted with acute encephalopathy and continues to have waxing and waning mental status per notes.  This is thought to potentially be related to polypharmacy (gabapentin, mirtazapine, and carbamazepine) and pain related to recent zoster infection in setting of an underlying mild neurocognitive disorder. Given persistent encephalopathy team is planning on LP today to further evaluate.  With regard to her history of primary myelofibrosis, she was initially diagnosed in 04/2018 and follows with Dr. [name] here at [facility].  She was initially treated with hydroxyurea but then transitioned to ruxolitinib in 12/2018.  She was started on ruxolitinib 20 mg BID, but then dose reduced to 10 mg BID in 01/2019, and has since been tolerating well.  Her treatment was briefly held while admitted in early February, but was restarted at time of discharge on 2/12.  Counts remain stable and she has been otherwise afebrile and HDS. Therefore, there is no contraindication to continuing Ruxolitinib at this time and at time of discharge. Given her recent zoster infection and increased risk of reactivation while on Ruxolitinib would recommend resuming Valtrex prophylaxis, which she will continue on indefinitely so long as she tolerates. We will sign off at this time unless additional concerns arise. Recommendations:   -- Continue Ruxolitinib 10 mg BID  -- Obtain daily CBC w/diff while admitted  -- Please start Valtrex, 500mg  daily for prophylaxis   -- Pt currently has follow-up with Dr. [name] on 05/15/19 (aware of admission)    This patient has been staffed with Dr. Malen Gauze. These recommendations were discussed with the primary team. We will sign off at this time but please don't hesitate to contact us if additional questions or concerns arise while she is hospitalized.     Please contact the malignant hematology fellow at [phone] with any further questions.    Hayley Mantis, MD, PhD  Hematology/Oncology Fellow    -------------------------------------------------------------  Interval History:  Patient transferred to Med A service on 2/27 for continued care. Encephalopathy has yet to resolve and plan for LP today. Seen and evaluated by Ophthalmology with no concern for zoster involvement at this time. She will follow-up with them ~4 weeks.      HPI: Hayley Wagner is a 86 y.o. woman with a PMHx of primary myelofibrosis (on Ruxolitinib 10 mg PO BID), CAD, thoracoabdominal aneurysm, HTN, and recent admission for AMS due to a UTI with admission c/b by zoster and delirium who was re-admitted with AMS.  Malignant Hematology was consulted for recommendations regarding continued Ruxolitinib therapy while admitted.    With regard to her recent admission in early February, she was treated with ceftriaxone/keflex for her UTI.  As above, her course was complicated by shingles (in the right V2 distribution) and she was treated with valtrex for 7 days (appears was not continued on prophylactic dosing).  Her lesions were already reportedly crusting over at time of discharge.  Her course  was also complicated by waxing and waning delirium which reportedly was improving at time of discharge, although had not resolved.    With regard to her current admission she was brought in with acute AMS, which developed after receiving her second COVID vaccine (she also notably became altered/delirious after her first COVID vaccine injection).  Currently the thought is her acute AMS is related to polypharmacy (increased dose of gabapentin, mirtazapine, and recent initiation of carbamazepine) and pian related to post herpetic neuralgia in the setting of likely baseline mild neurocognitive disorder.  She has remained afebrile and hemodynamically stable.  Labs notable for Hgb in the 8s, although this is improved from earlier in the month and has remained stable.  She has been endorsing right eye pain, was evaluated by Ophthalmology without concerning findings related to her recent zoster infection.  Furthermore, she had a CT Head and MRI brian with no acute findings.  She has been evaluated by Neurology who recommended carbamazepine for post herpetic pain.  There is no report of active zoster infection, but has been restarted on treatment dose valtrex.  On assessment she remains delirious and unable to participate in assessment, but does not appear to be in distress.    With regard to her history of primary myelofibrosis, she was diagnosed in 04/2018, follows with Dr. [name] here at [facility].  She was initially treated with hydroxyurea but then transitioned to ruxolitinib in 12/2018.  She was started on ruxolitinib 20 mg BID, but then dose reduced to 10 mg BID in 01/2019 and has since been tolerating well.  Her treatment was briefly held while admitted in early February, but was restarted on discharge (on 2/12).  As above her her counts remain stable and has not required recent transfusion support.    Review of Systems: Review of Systems - Unable to obtain secondary to AMS.    Oncologic History:  Oncology History   Primary myelofibrosis (CMS-HCC)   04/24/2018 Initial Diagnosis    Primary myelofibrosis (CMS-HCC)     04/22/2019 -  Chemotherapy    IP Ruxolitinib  [No description for this plan]       Past Medical History:   Diagnosis Date   ??? Anemia ??? At risk for falls    ??? CAD (coronary artery disease)    ??? Cancer (CMS-HCC)    ??? Chronic kidney disease    ??? GERD (gastroesophageal reflux disease)    ??? Hyperlipidemia    ??? Hypertension      Social History     Socioeconomic History   ??? Marital status: Widowed     Spouse name: Not on file   ??? Number of children: Not on file   ??? Years of education: Not on file   ??? Highest education level: Not on file   Occupational History   ??? Not on file   Social Needs   ??? Financial resource strain: Not very hard   ??? Food insecurity     Worry: Never true     Inability: Never true   ??? Transportation needs     Medical: No     Non-medical: No   Tobacco Use   ??? Smoking status: Former Smoker   ??? Smokeless tobacco: Never Used   Substance and Sexual Activity   ??? Alcohol use: Yes   ??? Drug use: Never   ??? Sexual activity: Not on file   Lifestyle   ??? Physical activity     Days per week: Not on file  Minutes per session: Not on file   ??? Stress: Not on file   Relationships   ??? Social Wellsite geologist on phone: Not on file     Gets together: Not on file     Attends religious service: Not on file     Active member of club or organization: Not on file     Attends meetings of clubs or organizations: Not on file     Relationship status: Widowed   Other Topics Concern   ??? Not on file   Social History Narrative   ??? Not on file     Social History     Social History Narrative   ??? Not on file     Allergies: has No Known Allergies.    Medications:   Meds:  ??? acetaminophen  650 mg Rectal Q8H    Or   ??? acetaminophen  1,000 mg Oral Q8H   ??? aspirin  81 mg Oral Daily   ??? bacitracin   Topical TID   ??? carboxymethylcellulose sodium  1 drop Both Eyes 6XD   ??? famotidine  20 mg Oral BID   ??? melatonin  3 mg Oral QPM   ??? pravastatin  40 mg Oral Nightly   ??? ruxolitinib  10 mg Oral BID   ??? ocular lubricant  1 application Both Eyes Nightly     Continuous Infusions:  ??? IP okay to treat     ??? sodium chloride       PRN Meds:.diphenhydrAMINE, EPINEPHrine IM, famotidine (PEPCID) IV, IP okay to treat, lidocaine, lidocaine 4%, meperidine, methylPREDNISolone sodium succinate (PF), sodium chloride, sodium chloride 0.9%    Objective:   Vitals: Temp:  [36.4 ??C (97.5 ??F)-37.1 ??C (98.7 ??F)] 36.4 ??C (97.5 ??F)  Heart Rate:  [80-91] 88  Resp:  [18] 18  BP: (145-163)/(78-88) 152/78  SpO2:  [94 %-96 %] 95 %    Physical Exam:  BP 152/78  - Pulse 88  - Temp 36.4 ??C (97.5 ??F) (Axillary)  - Resp 18  - Ht 144.8 cm (4' 9)  - Wt 51.3 kg (113 lb)  - SpO2 95%  - BMI 24.45 kg/m??    Deferred as patient transferred to [facility].     Test Results  Recent Labs     04/21/19  0902 04/22/19  0455 04/24/19  0608   WBC 8.7 8.2 7.9   NEUTROABS 6.3 5.7 5.4   HGB 8.7* 8.4* 8.9*   PLT 354 329 352     Imaging: Radiology studies were personally reviewed

## 2019-04-24 NOTE — Unmapped (Signed)
Patient alert and oriented x1 to self only. Patient isn't able to follow commands at this time, and unable to chew/swallow meds.  MD is aware of decreased responsiveness and decreased mental status. VSSA. No falls this shift. No s/s distress will continue to monitor and follow poc.     Problem: Skin Injury Risk Increased  Goal: Skin Health and Integrity  Outcome: Ongoing - Unchanged     Problem: Fall Injury Risk  Goal: Absence of Fall and Fall-Related Injury  Outcome: Ongoing - Unchanged     Problem: Adult Inpatient Plan of Care  Goal: Plan of Care Review  Outcome: Ongoing - Unchanged  Goal: Patient-Specific Goal (Individualization)  Outcome: Ongoing - Unchanged  Goal: Absence of Hospital-Acquired Illness or Injury  Outcome: Ongoing - Unchanged  Goal: Optimal Comfort and Wellbeing  Outcome: Ongoing - Unchanged  Goal: Readiness for Transition of Care  Outcome: Ongoing - Unchanged  Goal: Rounds/Family Conference  Outcome: Ongoing - Unchanged     Problem: Self-Care Deficit  Goal: Improved Ability to Complete Activities of Daily Living  Outcome: Ongoing - Unchanged     Problem: Heart Failure Comorbidity  Goal: Maintenance of Heart Failure Symptom Control  Outcome: Ongoing - Unchanged     Problem: Hypertension Comorbidity  Goal: Blood Pressure in Desired Range  Outcome: Ongoing - Unchanged

## 2019-04-24 NOTE — Unmapped (Signed)
Problem: Fall Injury Risk  Goal: Absence of Fall and Fall-Related Injury  Outcome: Progressing   VSS. Alert at times, drowsy most of the night. Patient voiced that her face hurt, scheduled APAP given. Slept well after APAP. Bed low, call bell within reach. Bed alarm on for safety.

## 2019-04-25 LAB — CBC W/ AUTO DIFF
BASOPHILS ABSOLUTE COUNT: 0.1 10*9/L (ref 0.0–0.1)
BASOPHILS RELATIVE PERCENT: 1.4 %
EOSINOPHILS ABSOLUTE COUNT: 0 10*9/L (ref 0.0–0.7)
EOSINOPHILS RELATIVE PERCENT: 0.4 %
HEMATOCRIT: 26.1 % — ABNORMAL LOW (ref 35.0–44.0)
HEMOGLOBIN: 8.7 g/dL — ABNORMAL LOW (ref 12.0–15.5)
LYMPHOCYTES ABSOLUTE COUNT: 1.3 10*9/L (ref 0.7–4.0)
LYMPHOCYTES RELATIVE PERCENT: 13.6 %
MEAN CORPUSCULAR HEMOGLOBIN CONC: 33.2 g/dL (ref 30.0–36.0)
MEAN CORPUSCULAR HEMOGLOBIN: 32.3 pg (ref 26.0–34.0)
MEAN CORPUSCULAR VOLUME: 97.1 fL (ref 82.0–98.0)
MEAN PLATELET VOLUME: 9.2 fL (ref 7.0–10.0)
MONOCYTES RELATIVE PERCENT: 12.9 %
NEUTROPHILS ABSOLUTE COUNT: 6.9 10*9/L (ref 1.7–7.7)
PLATELET COUNT: 347 10*9/L (ref 150–450)
RED BLOOD CELL COUNT: 2.69 10*12/L — ABNORMAL LOW (ref 3.90–5.03)
RED CELL DISTRIBUTION WIDTH: 29.4 % — ABNORMAL HIGH (ref 12.0–15.0)
WBC ADJUSTED: 9.6 10*9/L (ref 3.5–10.5)

## 2019-04-25 LAB — COMPREHENSIVE METABOLIC PANEL
ALBUMIN: 3.7 g/dL (ref 3.5–5.0)
ALT (SGPT): 31 U/L (ref <35)
ANION GAP: 12 mmol/L (ref 7–15)
AST (SGOT): 50 U/L — ABNORMAL HIGH (ref 14–38)
BILIRUBIN TOTAL: 0.9 mg/dL (ref 0.0–1.2)
BLOOD UREA NITROGEN: 22 mg/dL — ABNORMAL HIGH (ref 7–21)
BUN / CREAT RATIO: 33
CALCIUM: 9.2 mg/dL (ref 8.5–10.2)
CHLORIDE: 109 mmol/L — ABNORMAL HIGH (ref 98–107)
CO2: 27 mmol/L (ref 22.0–30.0)
CREATININE: 0.66 mg/dL (ref 0.60–1.00)
EGFR CKD-EPI AA FEMALE: >90 mL/min/{1.73_m2} (ref >=60)
EGFR CKD-EPI NON-AA FEMALE: 79 mL/min/{1.73_m2} (ref >=60)
GLUCOSE RANDOM: 117 mg/dL (ref 70–179)
POTASSIUM: 3.5 mmol/L (ref 3.5–5.0)
PROTEIN TOTAL: 6.4 g/dL — ABNORMAL LOW (ref 6.5–8.3)
SODIUM: 148 mmol/L — ABNORMAL HIGH (ref 135–145)

## 2019-04-25 LAB — BUN / CREAT RATIO: Urea nitrogen/Creatinine:MRto:Pt:Ser/Plas:Qn:: 33

## 2019-04-25 LAB — BASOPHILS RELATIVE PERCENT: Basophils/100 leukocytes:NFr:Pt:Bld:Qn:Automated count: 1.4

## 2019-04-25 NOTE — Unmapped (Signed)
Geriatrics (MEDA) Progress Note    Assessment & Plan:   Hayley Wagner is a 86 y.o. female with primary myelofibrosis (on Ruxolitinib 10 mg PO BID), CAD, thoracoabdominal aneurysm, HTN, cognitive dysfunction who presented to [facility] with Altered mental status, likely due to polypharmacy and zoster pain.    Principal Problem:    Altered mental status  Active Problems:    2-vessel coronary artery disease    Hyperlipidemia    Primary myelofibrosis (CMS-HCC)    Shingles    GERD (gastroesophageal reflux disease)  Resolved Problems:    * No resolved hospital problems. *    Delirium superimposed on mild neurocognitive disorder: Pt's recent baseline is a/ox3 with mild neurocognitive disorder (SLUMS 22/30) with sundowning based on previous, recent admission. She was transferred from SNF after having garbled speech and confusion. Suspect some component of this is polypharmacy in the setting of treatment for zoster related pain - recently increased gabapentin, added carbamazepine and mirtazapine at SNF. Also notable, patient received 2nd dose of covid vaccine on day of symptom onset, so this could also be an adverse reaction to the vaccine. Was considering LP to r/o VZV encephalitis, however patient has improved significantly today after escalating pain regimen. I do wonder now if her delirium was 2/2 med changes, and then exacerbated by her pain once those meds were stopped. Will hold off on LP for now given her rapid clinical improvement, and continue to monitor mental status through the day. Assuming she continues to get closer to her baseline, will plan for discharge back to SNF later this week.   - Holding gabapentin, mirtazapine, carbamazepine   - Delirium precautions     Shingles, post-herpetic neuralgia: Vesicular rash in the V2 distribution developed on 2/7; lesions healing but with persistent red crusting over right lip. Has had persistent pain in the area, likely post-herpetic neuralgia. Unfortunately, treatment of pain is limited by her altered mental status. Has tried gabapentin and carbamazepine with worsening confusion. Evaluated by ophthalmology on 2/26 - recommended artificial tears, erythromycin/bacitracin ophthalmic ointment, and outpatient follow up.  -Per heme/onc- patient needs valtrex for prophylaxis, unfortunately she is not taking PO right now, so started equivalent dose of IV acyclovir per pharmacy recs   -Tylenol 1000mg  q8hrs PRN with PR backup if not taking PO  -Oxycodone 2.5 or 5mg  q4hrs PRN  -bacitracin ointment to lesions three times daily  -artificial tears six times daily  -ophtho follow up appointment in 4-6 weeks (page ophtho on discharge to arrange)    Hypernatremia  Na trending up today at 148. Suspect hypovolemic hyponatremia 2/2 poor PO intake. Will encourage PO today now that patient more awake, alert.  -Daily CMP    Primary myelofibrosis: Diagnosed in 04/2018. Follows with Dr. [name] [facility] Hem/Onc). Initially treated with hydroxyurea but then transitioned to ruxolitinib in 12/2018.  -hematology following  -ruxolitinib 10 mg twice daily (not taking by PO currently)  -daily CBC w/ diff  -outpatient follow up (appt with Dr. [name] on 3/22)    Mild AST elevation: Patient developed elevation in AST and ALT after starting valtrex. Now recovering.  -morning CMP     Chronic medical conditions  CAD/HLD: Home aspirin and pravastatin  GERD: Home pepcid 20 mg twice daily  Daily Checklist:  Diet: Regular Diet  DVT PPx: Lovenox 40mg  q24h (hold in AM for LP)  Electrolytes: No Repletion Needed  Code Status: Full Code  Dispo: likely SNF, pending AMS workup     Subjective:   No acute  events overnight. Patient more awake and responsive this AM, although still not answering questions. Does follow some commands. Attempted LP today, but unable to get. Obtained IV access.     Objective:   Temp:  [36.5 ??C-36.9 ??C] 36.9 ??C  Heart Rate:  [93-102] 93  Resp:  [18] 18  BP: (117-136)/(60-86) 117/60  SpO2:  [95 %-97 %] 97 %    Gen: chronically ill appearing elderly female curled up in bed   HEENT: crusted lesion to lateral R lip, resisted opening eyes for exam   Heart: RRR, S1, S2, no M/R/G, no chest wall tenderness  Lungs: CTAB, no crackles or wheezes, no use of accessory muscles  Abdomen: Normoactive bowel sounds, soft, NTND, no rebound/guarding  Extremities: no clubbing, cyanosis, or edema  Neuro: awake, following some commands, moving all extremities     Labs/Studies: Labs and Studies from the last 24hrs per EMR and Reviewed

## 2019-04-26 LAB — CBC W/ AUTO DIFF
BASOPHILS ABSOLUTE COUNT: 0.2 10*9/L — ABNORMAL HIGH (ref 0.0–0.1)
BASOPHILS RELATIVE PERCENT: 1.1 %
EOSINOPHILS ABSOLUTE COUNT: 0.1 10*9/L (ref 0.0–0.7)
EOSINOPHILS RELATIVE PERCENT: 0.5 %
HEMATOCRIT: 26.8 % — ABNORMAL LOW (ref 35.0–44.0)
HEMOGLOBIN: 8.8 g/dL — ABNORMAL LOW (ref 12.0–15.5)
LYMPHOCYTES ABSOLUTE COUNT: 3.4 10*9/L (ref 0.7–4.0)
LYMPHOCYTES RELATIVE PERCENT: 23.8 %
MEAN CORPUSCULAR HEMOGLOBIN CONC: 33 g/dL (ref 30.0–36.0)
MEAN CORPUSCULAR HEMOGLOBIN: 32.1 pg (ref 26.0–34.0)
MEAN CORPUSCULAR VOLUME: 97.3 fL (ref 82.0–98.0)
MEAN PLATELET VOLUME: 9 fL (ref 7.0–10.0)
MONOCYTES ABSOLUTE COUNT: 1.3 10*9/L — ABNORMAL HIGH (ref 0.1–1.0)
MONOCYTES RELATIVE PERCENT: 8.7 %
NEUTROPHILS ABSOLUTE COUNT: 9.5 10*9/L — ABNORMAL HIGH (ref 1.7–7.7)
PLATELET COUNT: 456 10*9/L — ABNORMAL HIGH (ref 150–450)
WBC ADJUSTED: 14.4 10*9/L — ABNORMAL HIGH (ref 3.5–10.5)

## 2019-04-26 LAB — COMPREHENSIVE METABOLIC PANEL
ALBUMIN: 4 g/dL (ref 3.5–5.0)
ALKALINE PHOSPHATASE: 81 U/L (ref 38–126)
ALT (SGPT): 35 U/L — ABNORMAL HIGH (ref <35)
ANION GAP: 15 mmol/L (ref 7–15)
AST (SGOT): 57 U/L — ABNORMAL HIGH (ref 14–38)
BILIRUBIN TOTAL: 0.9 mg/dL (ref 0.0–1.2)
BUN / CREAT RATIO: 30
CALCIUM: 9.5 mg/dL (ref 8.5–10.2)
CHLORIDE: 104 mmol/L (ref 98–107)
CO2: 24 mmol/L (ref 22.0–30.0)
EGFR CKD-EPI AA FEMALE: 66 mL/min/{1.73_m2} (ref >=60)
EGFR CKD-EPI NON-AA FEMALE: 57 mL/min/{1.73_m2} — ABNORMAL LOW (ref >=60)
GLUCOSE RANDOM: 115 mg/dL (ref 70–179)
POTASSIUM: 3.4 mmol/L — ABNORMAL LOW (ref 3.5–5.0)
PROTEIN TOTAL: 6.8 g/dL (ref 6.5–8.3)
SODIUM: 143 mmol/L (ref 135–145)

## 2019-04-26 LAB — MAGNESIUM: Magnesium:MCnc:Pt:Ser/Plas:Qn:: 1.9

## 2019-04-26 LAB — BILIRUBIN TOTAL: Bilirubin:MCnc:Pt:Ser/Plas:Qn:: 0.9

## 2019-04-26 LAB — PROTIME-INR: INR: 1.13

## 2019-04-26 LAB — INR: Coagulation tissue factor induced.INR:RelTime:Pt:PPP:Qn:Coag: 1.13

## 2019-04-26 LAB — PLATELET COUNT: Platelets:NCnc:Pt:Bld:Qn:Automated count: 456 — ABNORMAL HIGH

## 2019-04-26 NOTE — Unmapped (Signed)
Geriatrics (MEDA) Progress Note    Assessment & Plan:   Hayley Wagner is a 86 y.o. female with primary myelofibrosis (on Ruxolitinib 10 mg PO BID), CAD, thoracoabdominal aneurysm, HTN, cognitive dysfunction who presented to [facility] with Altered mental status, likely due to polypharmacy and zoster pain.    Principal Problem:    Altered mental status  Active Problems:    2-vessel coronary artery disease    Hyperlipidemia    Primary myelofibrosis (CMS-HCC)    Shingles    GERD (gastroesophageal reflux disease)  Resolved Problems:    * No resolved hospital problems. *    Delirium superimposed on mild neurocognitive disorder: Pt's recent baseline is a/ox3 with mild neurocognitive disorder (SLUMS 22/30) with sundowning based on previous, recent admission. She was transferred from SNF after having garbled speech and confusion. Suspect some component of this is polypharmacy in the setting of treatment for zoster related pain - recently increased gabapentin, added carbamazepine and mirtazapine at SNF. Also notable, patient received 2nd dose of covid vaccine on day of symptom onset, so this could also be an adverse reaction to the vaccine. Was considering LP to r/o VZV encephalitis, however patient has improved significantly yesterday after escalating pain regimen. I do wonder now if her delirium was 2/2 med changes, and then exacerbated by her pain once those meds were stopped. Unfortunately patient was more agitated overnight, consistent with waxing and waning delirium. Will hold off on LP for now and continue to monitor mental status through the day. Assuming she continues to get closer to her baseline, will plan for discharge back to SNF later this week.   - Holding gabapentin, mirtazapine, carbamazepine   - Delirium precautions  - Holding lovenox  - Increase melatonin to 6mg  help restore sleep cycle  - Discontinue famotidine (has some anticholinergic effects)     Shingles, post-herpetic neuralgia: Vesicular rash in the V2 distribution developed on 2/7; lesions healing but with persistent red crusting over right lip. Has had persistent pain in the area, likely post-herpetic neuralgia. Unfortunately, treatment of pain is limited by her altered mental status. Has tried gabapentin and carbamazepine with worsening confusion. Evaluated by ophthalmology on 2/26 - recommended artificial tears, erythromycin/bacitracin ophthalmic ointment, and outpatient follow up.  -Continue valtrex 500mg  daily for prophylaxis per heme/onc  -Tylenol 1000mg  q8hrs PRN with PR backup if not taking PO  -Oxycodone 2.5 or 5mg  q4hrs PRN  -bacitracin ointment to lesions three times daily  -artificial tears six times daily  -ophtho follow up appointment in 4-6 weeks (page ophtho on discharge to arrange)    Constipation: Patient has not had bowel movement since 2/28. I worry this could be contributing to her delirium as well. Will escalate bowel regimen today and monitor for BM.   - Start senna 2 tabs BID  - Start miralax 17g BID  - Continue to encourage PO    Leukocytosis - Thrombocytosis: Acute increase in white count today from 9.6 to 14.4 as well as platelets, from 347 to 456.  Not accompanied by fever or tachycardia c/f infection. Infectious workup unremarkable on admission. Exam today without suprapubic tenderness to suggest UTI or new lung sounds to suggest developing pneumonia. I suspect this is reactive, possibly 2/2 to constipation or increased agitation overnight. Still, will monitor closely for s/s infection, being cognizant that she is elderly and may have a blunted fever response.  - Daily CBC w/ diff     Hypernatremia  Na improved 148 to 143 after PO hydration consistent  with hypovolemic hyponatremia.   -Daily CMP    Primary myelofibrosis: Diagnosed in 04/2018. Follows with Dr. [name] [facility] Hem/Onc). Initially treated with hydroxyurea but then transitioned to ruxolitinib in 12/2018.  -hematology following  -ruxolitinib 10 mg twice daily (not taking by PO currently)  -daily CBC w/ diff  -outpatient follow up (appt with Dr. [name] on 3/22)    Mild AST elevation: Patient developed elevation in AST and ALT after starting valtrex. Now recovering. AST continues to remain mildly elevated, but stable, likely medication related.   -Daily CMP     Chronic medical conditions  CAD/HLD: Home aspirin and pravastatin  GERD: Home pepcid 20 mg twice daily  Daily Checklist:  Diet: Regular Diet  DVT PPx: Lovenox 40mg  q24h (hold in AM for LP)  Electrolytes: No Repletion Needed  Code Status: Full Code  Dispo: likely SNF, pending AMS workup     Subjective:   Patient did not sleep much overnight, remained somewhat restless requiring supervision at the nurses station. This AM she is sleeping soundly, but will awaken to voice. She does still c/o facial pain and photophobia.     Objective:   Temp:  [35.7 ??C (96.3 ??F)-36.9 ??C (98.4 ??F)] 35.7 ??C (96.3 ??F)  Heart Rate:  [87-93] 92  Resp:  [16-20] 20  BP: (108-121)/(60-66) 108/60  SpO2:  [95 %-98 %] 95 %    Gen: chronically ill appearing elderly female curled up in bed   HEENT: crusted lesion to lateral R lip, resisted opening eyes for exam   Heart: RRR, S1, S2, no M/R/G, no chest wall tenderness  Lungs: CTAB, no crackles or wheezes, no use of accessory muscles  Abdomen: Normoactive bowel sounds, soft, NTND, no rebound/guarding  Extremities: no clubbing, cyanosis, or edema  Neuro: awake, following some commands, moving all extremities     Labs/Studies: Labs and Studies from the last 24hrs per EMR and Reviewed

## 2019-04-26 NOTE — Unmapped (Signed)
Vital signs are stable.  Having a difficult time getting to sleep tonight. Her son left last evening and she has been restless. Her pain is described as stabbing intermittant. The oxycodone sems to help, but she continues to be sensitive to light.  She is only aware of herself.  Problem: Skin Injury Risk Increased  Goal: Skin Health and Integrity  Outcome: Progressing     Problem: Fall Injury Risk  Goal: Absence of Fall and Fall-Related Injury  Outcome: Progressing     Problem: Adult Inpatient Plan of Care  Goal: Plan of Care Review  Outcome: Progressing  Goal: Patient-Specific Goal (Individualization)  Outcome: Progressing  Goal: Absence of Hospital-Acquired Illness or Injury  Outcome: Progressing  Goal: Optimal Comfort and Wellbeing  Outcome: Progressing  Goal: Readiness for Transition of Care  Outcome: Progressing  Goal: Rounds/Family Conference  Outcome: Progressing

## 2019-04-26 NOTE — Unmapped (Signed)
Patient alert and oriented x1. Patient much more interactive this shift. Patient voided in toilet twice today. Patient up to the chair for most of the shift. VSSA. No falls this shift. But bed alarm on and active.  Poor PO intake. Delirium precautions maintained No s/s distress will continue to monitor and follow poc.   Problem: Skin Injury Risk Increased  Goal: Skin Health and Integrity  Outcome: Progressing     Problem: Fall Injury Risk  Goal: Absence of Fall and Fall-Related Injury  Outcome: Progressing     Problem: Adult Inpatient Plan of Care  Goal: Plan of Care Review  Outcome: Progressing  Goal: Patient-Specific Goal (Individualization)  Outcome: Progressing  Goal: Absence of Hospital-Acquired Illness or Injury  Outcome: Progressing  Goal: Optimal Comfort and Wellbeing  Outcome: Progressing  Goal: Readiness for Transition of Care  Outcome: Progressing  Goal: Rounds/Family Conference  Outcome: Progressing     Problem: Self-Care Deficit  Goal: Improved Ability to Complete Activities of Daily Living  Outcome: Progressing     Problem: Heart Failure Comorbidity  Goal: Maintenance of Heart Failure Symptom Control  Outcome: Progressing     Problem: Hypertension Comorbidity  Goal: Blood Pressure in Desired Range  Outcome: Progressing

## 2019-04-27 LAB — COMPREHENSIVE METABOLIC PANEL
ALBUMIN: 3.3 g/dL — ABNORMAL LOW (ref 3.5–5.0)
ALKALINE PHOSPHATASE: 67 U/L (ref 38–126)
ALT (SGPT): 36 U/L — ABNORMAL HIGH (ref <35)
ANION GAP: 11 mmol/L (ref 7–15)
AST (SGOT): 58 U/L — ABNORMAL HIGH (ref 14–38)
BILIRUBIN TOTAL: 0.7 mg/dL (ref 0.0–1.2)
BUN / CREAT RATIO: 34
CALCIUM: 9.1 mg/dL (ref 8.5–10.2)
CO2: 24 mmol/L (ref 22.0–30.0)
CREATININE: 0.79 mg/dL (ref 0.60–1.00)
EGFR CKD-EPI AA FEMALE: 77 mL/min/{1.73_m2} (ref >=60)
EGFR CKD-EPI NON-AA FEMALE: 67 mL/min/{1.73_m2} (ref >=60)
GLUCOSE RANDOM: 85 mg/dL (ref 70–179)
POTASSIUM: 3.8 mmol/L (ref 3.5–5.0)
PROTEIN TOTAL: 5.8 g/dL — ABNORMAL LOW (ref 6.5–8.3)
SODIUM: 141 mmol/L (ref 135–145)

## 2019-04-27 LAB — CBC W/ AUTO DIFF
BASOPHILS ABSOLUTE COUNT: 0.1 10*9/L (ref 0.0–0.1)
BASOPHILS RELATIVE PERCENT: 1.2 %
EOSINOPHILS ABSOLUTE COUNT: 0.1 10*9/L (ref 0.0–0.7)
EOSINOPHILS RELATIVE PERCENT: 0.8 %
HEMATOCRIT: 23 % — ABNORMAL LOW (ref 35.0–44.0)
HEMOGLOBIN: 7.7 g/dL — ABNORMAL LOW (ref 12.0–15.5)
LYMPHOCYTES ABSOLUTE COUNT: 2 10*9/L (ref 0.7–4.0)
LYMPHOCYTES RELATIVE PERCENT: 22.4 %
MEAN CORPUSCULAR HEMOGLOBIN: 32.6 pg (ref 26.0–34.0)
MEAN CORPUSCULAR VOLUME: 97.4 fL (ref 82.0–98.0)
MEAN PLATELET VOLUME: 9 fL (ref 7.0–10.0)
MONOCYTES ABSOLUTE COUNT: 1 10*9/L (ref 0.1–1.0)
MONOCYTES RELATIVE PERCENT: 10.8 %
NEUTROPHILS ABSOLUTE COUNT: 5.8 10*9/L (ref 1.7–7.7)
PLATELET COUNT: 324 10*9/L (ref 150–450)
RED BLOOD CELL COUNT: 2.36 10*12/L — ABNORMAL LOW (ref 3.90–5.03)
RED CELL DISTRIBUTION WIDTH: 29.6 % — ABNORMAL HIGH (ref 12.0–15.0)

## 2019-04-27 LAB — MONOCYTES ABSOLUTE COUNT: Monocytes:NCnc:Pt:Bld:Qn:Automated count: 1

## 2019-04-27 LAB — MAGNESIUM
MAGNESIUM: 1.8 mg/dL (ref 1.6–2.2)
Magnesium:MCnc:Pt:Ser/Plas:Qn:: 1.8

## 2019-04-27 LAB — SLIDE REVIEW

## 2019-04-27 LAB — PROTEIN TOTAL: Protein:MCnc:Pt:Ser/Plas:Qn:: 5.8 — ABNORMAL LOW

## 2019-04-27 LAB — POIKILOCYTES

## 2019-04-27 NOTE — Unmapped (Signed)
Geriatrics ([facility]) Progress Note    Assessment & Plan:   Hayley Wagner is a 86 y.o. female with primary myelofibrosis (on Ruxolitinib 10 mg PO BID), CAD, thoracoabdominal aneurysm, HTN, cognitive dysfunction who presented to [facility] with altered mental status, likely due to polypharmacy and post-herpetic neuralgia.    Principal Problem:    Altered mental status  Active Problems:    2-vessel coronary artery disease    Hyperlipidemia    Primary myelofibrosis (CMS-HCC)    Shingles    GERD (gastroesophageal reflux disease)  Resolved Problems:    * No resolved hospital problems. *    Delirium superimposed on mild neurocognitive disorder: Pt's recent baseline is a/ox3 with mild neurocognitive disorder (SLUMS 22/30) with sundowning based on previous, recent admission. She was transferred from SNF after having garbled speech and confusion. Suspect some component of this is polypharmacy in the setting of treatment for zoster related pain - recently increased gabapentin, added carbamazepine and mirtazapine at SNF. Also notable, patient received 2nd dose of covid vaccine on day of symptom onset, so this could also be an adverse reaction to the vaccine. Was considering LP to r/o VZV encephalitis, however patient has improved significantly yesterday after adding carbamazepine back. I do wonder now if her delirium was 2/2 med changes, and then exacerbated by her pain once those meds were stopped. In addition, her delirium could be stemming from poor sleep. Unfortunately patient was more agitated overnight, consistent with waxing and waning delirium. Will hold off on LP for now and continue to monitor mental status through the day. Assuming she continues to get closer to her baseline, will plan for discharge back to SNF tomorrow (3/5)  - Holding gabapentin, mirtazapine  - Delirium precautions  - Increase melatonin to 6mg  help restore sleep cycle     Shingles, post-herpetic neuralgia: Vesicular rash in the V2 distribution developed on 2/7; lesions healing but with persistent red crusting over right lip. Has had persistent pain in the area, c/w post-herpetic neuralgia. Unfortunately, treatment of pain is limited by her altered mental status. Has tried gabapentin and carbamazepine with worsening confusion. Evaluated by ophthalmology on 2/26 - recommended artificial tears, erythromycin/bacitracin ophthalmic ointment, and outpatient follow up.  -Continue valtrex 500mg  daily for prophylaxis per heme/onc  -Increase carbamazepine to 200 mg BID  -Tylenol 1000mg  TID  -Oxycodone 2.5mg  PRN for breakthrough pain  -bacitracin ointment to lesions three times daily  -artificial tears six times daily  -ophtho follow up appointment in 4-6 weeks (page ophtho on discharge to arrange)    Constipation: Patient has not had bowel movement since 2/28. I suspect that her poor appetite may be contributing to this. I worry this could be contributing to her delirium as well. Will escalate bowel regimen today and monitor for BM. Patient had a bowel movement today.  - senna 2 tabs BID  - miralax 17g BID    Leukocytosis - Thrombocytosis: Resolved today.  - Daily CBC w/ diff     Hypernatremia: Resolved with increased PO intake  -Daily CMP    Primary myelofibrosis: Diagnosed in 04/2018. Follows with Dr. [name] [facility] Hem/Onc). Initially treated with hydroxyurea but then transitioned to ruxolitinib in 12/2018.  -ruxolitinib 10 mg twice daily (not taking by PO currently)  -Daily CBC w/ diff  -outpatient follow up (appt with Dr. [name] on 3/22)    Mild Transaminitis: Patient developed elevation in AST and ALT after starting valtrex. Now improved but still persistently mildly elevated.  -Daily CMP  Chronic medical conditions  CAD/HLD: Home aspirin and pravastatin  GERD: Home pepcid 20 mg twice daily  Daily Checklist:  Diet: Regular Diet  DVT PPx: Lovenox 40mg  q24h  Electrolytes: No Repletion Needed  Code Status: Full Code  Dispo: SNF    Subjective:   Patient had better sleep last night, but was restless and delirious for some period of time.  Patient regained full awareness and was able to have full conversations later in the day. Patient appears to have better appetite.     Objective:   Temp:  [36.8 ??C] 36.8 ??C  Heart Rate:  [84] 84  Resp:  [16] 16  BP: (135)/(60) 135/60  SpO2:  [92 %] 92 %    Gen: chronically ill appearing elderly female curled up in bed   HEENT: crusted lesion to lateral R lip, resisted opening eyes for exam   Heart: RRR, S1, S2, no M/R/G, no chest wall tenderness  Lungs: CTAB, no crackles or wheezes, no use of accessory muscles  Abdomen: Normoactive bowel sounds, soft, NTND, no rebound/guarding  Extremities: no clubbing, cyanosis, or edema  Neuro: awake, following some commands, moving all extremities     Labs/Studies: Labs and Studies from the last 24hrs per EMR and Reviewed     I attest that I have reviewed the student note and that the components of the history of the present illness, the physical exam, and the assessment and plan documented were performed by me or were performed in my presence by the student where I verified the documentation and performed (or re-performed) the exam and medical decision making. Esperanza Richters, MD

## 2019-04-27 NOTE — Unmapped (Signed)
Pt has been free from falls throughout the shift. Pt has been drowsy, but has about 6 hours of awake time in the recliner.  Appetite poor. No c/o N/V/D. Nonverbal pain score 5/10.  Tylenol given which gave relief.VSS.   Problem: Skin Injury Risk Increased  Goal: Skin Health and Integrity  Outcome: Progressing     Problem: Fall Injury Risk  Goal: Absence of Fall and Fall-Related Injury  Outcome: Progressing     Problem: Adult Inpatient Plan of Care  Goal: Plan of Care Review  Outcome: Progressing  Goal: Patient-Specific Goal (Individualization)  Outcome: Progressing  Goal: Absence of Hospital-Acquired Illness or Injury  Outcome: Progressing  Goal: Optimal Comfort and Wellbeing  Outcome: Progressing  Goal: Readiness for Transition of Care  Outcome: Progressing  Goal: Rounds/Family Conference  Outcome: Progressing     Problem: Self-Care Deficit  Goal: Improved Ability to Complete Activities of Daily Living  Outcome: Progressing     Problem: Heart Failure Comorbidity  Goal: Maintenance of Heart Failure Symptom Control  Outcome: Progressing     Problem: Hypertension Comorbidity  Goal: Blood Pressure in Desired Range  Outcome: Progressing

## 2019-04-27 NOTE — Unmapped (Signed)
Pt A&Ox1, VSS, multiple attempts to get oob without assistance, sat pt at nsg station for a while, pain management with prn oxy, bed alarm on, see flowsheets for details       Problem: Skin Injury Risk Increased  Goal: Skin Health and Integrity  Outcome: Progressing     Problem: Fall Injury Risk  Goal: Absence of Fall and Fall-Related Injury  Outcome: Progressing     Problem: Adult Inpatient Plan of Care  Goal: Plan of Care Review  Outcome: Progressing  Goal: Patient-Specific Goal (Individualization)  Outcome: Progressing  Goal: Absence of Hospital-Acquired Illness or Injury  Outcome: Progressing  Goal: Optimal Comfort and Wellbeing  Outcome: Progressing  Goal: Readiness for Transition of Care  Outcome: Progressing  Goal: Rounds/Family Conference  Outcome: Progressing     Problem: Self-Care Deficit  Goal: Improved Ability to Complete Activities of Daily Living  Outcome: Progressing     Problem: Heart Failure Comorbidity  Goal: Maintenance of Heart Failure Symptom Control  Outcome: Progressing     Problem: Hypertension Comorbidity  Goal: Blood Pressure in Desired Range  Outcome: Progressing

## 2019-04-28 LAB — HEMATOCRIT: Hematocrit:VFr:Pt:Bld:Qn:: 25.3 — ABNORMAL LOW

## 2019-04-28 LAB — COMPREHENSIVE METABOLIC PANEL
ALBUMIN: 3.6 g/dL (ref 3.5–5.0)
ALKALINE PHOSPHATASE: 71 U/L (ref 38–126)
ALT (SGPT): 38 U/L — ABNORMAL HIGH (ref <35)
ANION GAP: 10 mmol/L (ref 7–15)
AST (SGOT): 56 U/L — ABNORMAL HIGH (ref 14–38)
BLOOD UREA NITROGEN: 23 mg/dL — ABNORMAL HIGH (ref 7–21)
BUN / CREAT RATIO: 32
CALCIUM: 9 mg/dL (ref 8.5–10.2)
CHLORIDE: 106 mmol/L (ref 98–107)
CO2: 26 mmol/L (ref 22.0–30.0)
EGFR CKD-EPI AA FEMALE: 85 mL/min/{1.73_m2} (ref >=60)
EGFR CKD-EPI NON-AA FEMALE: 74 mL/min/{1.73_m2} (ref >=60)
GLUCOSE RANDOM: 91 mg/dL (ref 70–179)
POTASSIUM: 3.9 mmol/L (ref 3.5–5.0)
PROTEIN TOTAL: 6 g/dL — ABNORMAL LOW (ref 6.5–8.3)
SODIUM: 142 mmol/L (ref 135–145)

## 2019-04-28 LAB — CBC W/ AUTO DIFF
BASOPHILS RELATIVE PERCENT: 1 %
EOSINOPHILS ABSOLUTE COUNT: 0.1 10*9/L (ref 0.0–0.7)
EOSINOPHILS RELATIVE PERCENT: 0.9 %
HEMATOCRIT: 25.3 % — ABNORMAL LOW (ref 35.0–44.0)
HEMOGLOBIN: 8.5 g/dL — ABNORMAL LOW (ref 12.0–15.5)
LYMPHOCYTES ABSOLUTE COUNT: 1.6 10*9/L (ref 0.7–4.0)
LYMPHOCYTES RELATIVE PERCENT: 19.2 %
MEAN CORPUSCULAR HEMOGLOBIN CONC: 33.5 g/dL (ref 30.0–36.0)
MEAN CORPUSCULAR HEMOGLOBIN: 32.8 pg (ref 26.0–34.0)
MEAN CORPUSCULAR VOLUME: 98.1 fL — ABNORMAL HIGH (ref 82.0–98.0)
MONOCYTES ABSOLUTE COUNT: 0.9 10*9/L (ref 0.1–1.0)
MONOCYTES RELATIVE PERCENT: 10.2 %
NEUTROPHILS ABSOLUTE COUNT: 5.7 10*9/L (ref 1.7–7.7)
NEUTROPHILS RELATIVE PERCENT: 68.7 %
PLATELET COUNT: 367 10*9/L (ref 150–450)
RED CELL DISTRIBUTION WIDTH: 30.1 % — ABNORMAL HIGH (ref 12.0–15.0)
WBC ADJUSTED: 8.4 10*9/L (ref 3.5–10.5)

## 2019-04-28 LAB — MAGNESIUM: Magnesium:MCnc:Pt:Ser/Plas:Qn:: 1.8

## 2019-04-28 LAB — PROTEIN TOTAL: Protein:MCnc:Pt:Ser/Plas:Qn:: 6 — ABNORMAL LOW

## 2019-04-28 MED ORDER — ALUMINUM-MAG HYDROXIDE-SIMETHICONE 400 MG-400 MG-40 MG/5 ML ORAL SUSP
Freq: Four times a day (QID) | ORAL | 0 refills | 3.00000 days | PRN
Start: 2019-04-28 — End: 2019-05-28

## 2019-04-28 MED ORDER — LIDOCAINE 4 % TOPICAL CREAM
Freq: Three times a day (TID) | TOPICAL | 0 refills | 0 days | PRN
Start: 2019-04-28 — End: 2020-04-27

## 2019-04-28 MED ORDER — OXYCODONE 5 MG TABLET
ORAL_TABLET | ORAL | 0 refills | 5.00000 days | PRN
Start: 2019-04-28 — End: 2019-05-03

## 2019-04-28 MED ORDER — BACITRACIN ZINC 500 UNIT/GRAM TOPICAL OINTMENT
Freq: Three times a day (TID) | TOPICAL | 0 refills | 0 days
Start: 2019-04-28 — End: ?

## 2019-04-28 MED ORDER — CARBOXYMETHYLCELLULOSE SODIUM 0.25 % EYE DROPS
OPHTHALMIC | 0 refills | 0 days
Start: 2019-04-28 — End: ?

## 2019-04-28 MED ORDER — MELATONIN 3 MG TABLET
Freq: Every evening | ORAL | 0 refills | 0.00000 days
Start: 2019-04-28 — End: ?

## 2019-04-28 NOTE — Unmapped (Signed)
Pt A&Ox1. Of note patient was very awake, alert and interactive for breakfast today. She had been complaining of a lot of pain and after progressive treatment (see MAR) patient became very lethargic after oxycodone 5 mg dose. Due to this she has slept much of the day despite trying to keep her awake with lights on, shades up, and son visiting. She was unable to tolerate PT or OT today. She has been without significant event otherwise and remains free of falls.       Problem: Skin Injury Risk Increased  Goal: Skin Health and Integrity  04/27/2019 1614 by Wendi Maya, RN  Outcome: Progressing     Problem: Fall Injury Risk  Goal: Absence of Fall and Fall-Related Injury  04/27/2019 1614 by Wendi Maya, RN  Outcome: Progressing     Problem: Adult Inpatient Plan of Care  Goal: Plan of Care Review  04/27/2019 1614 by Wendi Maya, RN  Outcome: Progressing  Goal: Patient-Specific Goal (Individualization)  04/27/2019 1615 by Wendi Maya, RN  Outcome: Progressing  04/27/2019 1614 by Wendi Maya, RN  Flowsheets (Taken 04/27/2019 1614)  Patient-Specific Goals (Include Timeframe): pt will remain free of falls today.  Individualized Care Needs: falls precautions, pain control, safety. incontinence care  Anxieties, Fears or Concerns: pain control  Goal: Absence of Hospital-Acquired Illness or Injury  04/27/2019 1614 by Wendi Maya, RN  Outcome: Progressing  Goal: Optimal Comfort and Wellbeing  04/27/2019 1614 by Wendi Maya, RN  Outcome: Progressing  Goal: Readiness for Transition of Care  04/27/2019 1614 by Wendi Maya, RN  Outcome: Progressing  Goal: Rounds/Family Conference  04/27/2019 1614 by Wendi Maya, RN  Outcome: Progressing     Problem: Self-Care Deficit  Goal: Improved Ability to Complete Activities of Daily Living    04/27/2019 1614 by Wendi Maya, RN  Outcome: Progressing     Problem: Heart Failure Comorbidity  Goal: Maintenance of Heart Failure Symptom Control  04/27/2019 1614 by Wendi Maya, RN  Outcome: Progressing Problem: Hypertension Comorbidity  Goal: Blood Pressure in Desired Range  04/27/2019 1614 by Wendi Maya, RN  Outcome: Progressing

## 2019-04-28 NOTE — Unmapped (Signed)
[facility] Discharge Summary     Identifying Information:   Hayley Wagner  1928/09/13  [id number]    PCP: Caryl Asp, FNP    Admit date: 04/20/2019     Discharge date: 04/28/2019     Discharge Attending Physician: Rea College, MD; (P): [phone]    Discharge to: Skilled Nursing Facility: [facility]; T: [phone]; F: [phone]     Discharge Diagnoses:  Principal Problem:    Altered mental status  Active Problems:    2-vessel coronary artery disease    Hyperlipidemia    Primary myelofibrosis (CMS-HCC)    Shingles    GERD (gastroesophageal reflux disease)  Resolved Problems:    * No resolved Wagner problems. [facility] [place] Course     PCP Post-Discharge Follow Up Issues:   -- Please recheck CBC with differential in around 2 weeks, as patient is on Jakafi for myelofibrosis, per her oncology team.   -- Please continue to monitor the resolution of her likely shingles infection on the right side of her face.   -- Patient has had continued mild delirium in the Wagner, but please further monitor her cognitive status.  --Patient has been on narcotics, please monitor for constipation  ??      To contact the discharging attending physician with regard to the patient's hospitalization or follow-up needs, please call: [facility] [phone]).     Hayley Wagner??is a 86 y.o.??female??with primary myelofibrosis (on Ruxolitinib 10 mg PO BID), CAD, thoracoabdominal aneurysm, HTN,??cognitive dysfunction, and recent facial shingles infection who presented to [facility] with Altered mental status.  ????  AMS:??Patient with prior recent baseline a/ox3 with mild neurocognitive disorder was transferred from SNF after having garbled speech and confusion a few hours after receiving the 2nd covid vaccine. On initial evaluation she was mildly agitated, pulling at clothes, and refusing PO c/w delirium. Her delirium improved by holding psychotropic medications and escalating her pain regimen. Her delirium was most likely 2/2 polypharmacy (recently increased gabapentin, added carbamazepine and mirtazapine at SNF) confounded by inadequate pain control, and possibly an adverse reaction to the covid vaccine. VZV encephalitis encephalitis was considered, but given her lack of additional infectious symptoms and rapid improvement with pain control, LP was not ultimately pursued. She was resumed on carbamazepine BID with improvement in pain prior to discharge, and provided short course of oxycodone. Would be cautious with adding centrally acting pain medications like gabapentin given her propensity for delirium. At time of discharge, patient was alert, oriented x1, not agitated, following commands and answering most questions appropriately. Unfortunately, she may have lasting cognitive impairment from this persistent episode of delirium.   ??  Shingles, post-herpetic neuralgia:??Vesicular rash in the V2 distribution developed on 2/7; lesions were healing at time of admission but with persistent red crusting over right lip. She had persistent pain in the area, likely post-herpetic neuralgia. Unfortunately, treatment of pain was limited by her altered mental status, as she tried gabapentin and carbamazepine with worsening confusion.??She was evaluated by ophthalmology??on??2/26 - recommended artificial tears, erythromycin/bacitracin ophthalmic ointment, and outpatient follow up. Heme/onc was following this patient, recommended continuing prophylactic valtrex 500mg  daily.   ??  Primary myelofibrosis: Diagnosed in 04/2018. Follows with Dr. [name] [facility] Hem/Onc). Initially treated with hydroxyurea but then transitioned to ruxolitinib in 12/2018. Ruxolitinib 10 mg twice daily was continued while inpatient, although patient refused many doses. She was scheduled for outpatient follow up (appt with Dr. [name] on 3/22)    ??  GERIATRIC ASSESSMENT:    Hayley Wagner is an 86 yo female with the recent presentation of delirium in the setting of post-herpetic neuralgia.  She has previously scored a 22/30 (03/2019) in her SLUMS assessment, indicating mild underlying neurocognitive impairment. She previously lived in [place] by herself with a paid caregiver who arrives for 4 hours a day to assist with chores, meal preparation, and driving her to her appointments.   ??  She has a doctorate in music therapy. She was previously married, but lives alone now after her husband died at the age of 16.     Functional Assessment       ADLs:  IADLs:   Feeding: Requires Assistance  Dressing: Requires Assistance  Ambulation: Requires Assistance  Toileting: Requires Assistance  Bathing: Requires Assistance   Using the phone: Dependent  Shopping: Dependent  Meal preparation: Dependent  Medication mgmt: Dependent  Managing finances: Dependent  Housework: Dependent  Transportation (driving or navigating public transit): Dependent     Living situation: Patient lives in own home with noone (lives alone).     Changes in ADLs during hospitalization: Yes      Cognitive Assessment     Delirium Assessment: CAM (Confusion Assessment Method):    On discharge, the patient DID show evidence of milf delerium. Discharging mental status: A&Ox1.      Other cognitive assessment:  The patient was unable to participate in formal mental status screening exams due to delirium.    Advance Care Planning     Code Status: Full Code  During previous recent admission in February, there was some conflicting information and likely misunderstanding (due to circumstances in the [facility], and need to double mask) re: what her goals of care are including Advanced Care Planning. At that time, she stated that she would like full course of resuscitation should her heart rhythm changes so it is not compatible with life or if she stops breathing. She also stated that she previously talked to her PCP (an NP at [facility]) that she would like a natural death and not be kept alive by artificial means. At that time, stressed that once out of the Wagner she should follow up with PCP and makes sure her wishes are clearer and that she revisit this issue.     During this visit, patient's son Hayley Wagner was at bedside often. Prior to discharge we discussed GOC. He stated that patient had filled out a general POA form with her lawyer, but nothing in detail. Advised him that f/u with PCP to have more in depth conversation, and if patient is more oriented at that time, then she can participate as well.     Code Status: Full Code   Health Care Decision Maker as of 04/01/2019   ??  Surrogate decision maker: Hayley Wagner (son)   Emergency Contact:   Extended Emergency Contact Information   Primary Emergency Contact: Hayley Wagner,Hayley Wagner   Mobile Phone: [phone]   Relation: Son   Secondary Emergency Contact: Hayley Wagner   Mobile Phone: [phone]   Relation: Daughter in [place]   Preferred language: ENGLISH   Interpreter needed? No  ??        Procedures   None  No admission procedures for Wagner encounter.    Discharge Day Services:   BP 137/65  - Pulse 87  - Temp 35.6 ??C (Oral)  - Resp 18  - Ht 144.8 cm (4' 9)  - Wt 51.3 kg (113 lb)  - SpO2 97%  - BMI  24.45 kg/m??    Last 5 Recorded Weights    04/20/19 1746 04/21/19 0157 04/22/19 2116   Weight: 56 kg (123 lb 7.3 oz) 53.8 kg (118 lb 8 oz) 51.3 kg (113 lb)       Pt seen on the day of discharge and determined appropriate for discharge.    Condition at Discharge: stable    Length of Discharge: I spent greater than 30 mins in the discharge of this patient.    Discharge Medication List:        Your Medication List      STOP taking these medications    famotidine 20 MG tablet  Commonly known as: PEPCID     gabapentin 100 MG capsule  Commonly known as: NEURONTIN     mirtazapine 15 MG tablet  Commonly known as: REMERON        START taking these medications    aluminum-magnesium hydroxide-simethicone 400-400-40 mg/5 mL suspension  Commonly known as: MAALOX MAX  Take 30 mL by mouth every six (6) hours as needed for indigestion.     bacitracin 500 unit/gram ointment  Apply topically Three (3) times a day. Apply to lesion on right upper lip until resolved     carboxymethylcellulose sodium 0.25 % Drop  Commonly known as: THERATEARS  Administer 1 drop to both eyes Six (6) times a day.     lidocaine 4 % cream  Commonly known as: LMX  Apply topically Three (3) times a day as needed (Apply to face for face pain).     melatonin 3 mg Tab  Take 2 tablets (6 mg total) by mouth every evening. At 6pm     oxyCODONE 5 MG immediate release tablet  Commonly known as: ROXICODONE  Take 0.5 tablets (2.5 mg total) by mouth every four (4) hours as needed for up to 5 days.        CHANGE how you take these medications    valACYclovir 500 MG tablet  Commonly known as: VALTREX  Take 1 tablet (500 mg total) by mouth daily.  Start taking on: April 29, 2019  What changed:   ?? medication strength  ?? how much to take  ?? when to take this        CONTINUE taking these medications    acetaminophen 500 MG tablet  Commonly known as: TYLENOL  Take 1,000 mg by mouth every eight (8) hours.     aspirin 81 MG chewable tablet  Chew 81 mg daily.     betamethasone valerate 0.1 % lotion  Commonly known as: VALISONE  Apply 1 application topically Two (2) times a day.     carBAMazepine 200 mg tablet  Commonly known as: TEGretol  Take 200 mg by mouth Two (2) times a day.     ergocalciferol (vitamin D2) 50 mcg (2,000 unit) Cap  Take 1 capsule by mouth daily.     JAKAFI 5 mg tablet  Generic drug: ruxolitinib  Take 2 tablets (10 mg total) by mouth Two (2) times a day.     lidocaine 2% viscous 2 % Soln  Commonly known as: XYLOCAINE  15 mL by Mouth route every four (4) hours as needed (for mouth pain prior to eating).     metroNIDAZOLE 1 % gel  Commonly known as: METROGEL  Small amount as needed twice a day     multivitamin per tablet  Commonly known as: TAB-A-VITE/THERAGRAN  Take 1 tablet by mouth daily.     mupirocin 2 %  ointment Commonly known as: BACTROBAN  Apply 1 application topically 2 (two) times daily. Behind R ear     polyethylene glycol 17 gram packet  Commonly known as: MIRALAX  Take 17 g by mouth daily.     RESTASIS MULTIDOSE 0.05 % Drop  Generic drug: cycloSPORINE  Administer 1 drop to both eyes nightly as needed.     senna-docusate 8.6-50 mg  Commonly known as: PERICOLACE  Take 2 tablets by mouth at bedtime.     simvastatin 20 MG tablet  Commonly known as: ZOCOR  TAKE 1 TABLET BY MOUTH ONCE DAILY     vitamin C 250 MG tablet  Generic drug: ascorbic acid (vitamin C)  Take 250 mg by mouth daily.               Pending Test Results (if blank, then none):      Most Recent Labs:  Microbiology Results (last day)     Procedure Component Value Date/Time Date/Time    COVID-19 PCR [[id number]]  (Normal) Collected: 04/27/19 1443    Lab Status: Final result Specimen: Nasopharyngeal Swab Updated: 04/27/19 2300     SARS-CoV-2 PCR Not Detected    Narrative:      This test was performed using the Abbott Alinity m SARS-CoV-2 assay which has been validated by the CLIA-certified, CAP-inspected [facility]. FDA has granted Emergency Use Authorization for this test. This real-time RT-PCR test detects SARS-CoV-2 by targeting the N and RdRp genes. Negative results do not preclude SARS-CoV-2 infection and should not be used as the sole basis for patient management decisions. Negative results must be combined with clinical observations, patient history, and epidemiological information.     Information for providers and patients can be found here: [email or web address]              Lab Results   Component Value Date    WBC 8.4 04/28/2019    HGB 8.5 (L) 04/28/2019    HCT 25.3 (L) 04/28/2019    PLT 367 04/28/2019       Lab Results   Component Value Date    NA 142 04/28/2019    K 3.9 04/28/2019    CL 106 04/28/2019    CO2 26.0 04/28/2019 BUN 23 (H) 04/28/2019    CREATININE 0.73 04/28/2019    CALCIUM 9.0 04/28/2019    MG 1.8 04/28/2019    PHOS 3.3 04/20/2019       Lab Results   Component Value Date    ALKPHOS 71 04/28/2019    BILITOT 0.8 04/28/2019    BILIDIR 0.40 04/21/2019    PROT 6.0 (L) 04/28/2019    ALBUMIN 3.6 04/28/2019    ALT 38 (H) 04/28/2019    AST 56 (H) 04/28/2019       Lab Results   Component Value Date    PT 13.4 04/26/2019    INR 1.13 04/26/2019     [facility]:  Ecg 12 Lead (adult)    Result Date: 04/21/2019  NORMAL SINUS RHYTHM BASELINE ARTIFACT ST & T WAVE ABNORMALITY, CONSIDER ANTERIOR ISCHEMIA ABNORMAL ECG WHEN COMPARED WITH ECG OF 31-Mar-2019 18:00, POOR DATA QUALITY IN CURRENT ECG PRECLUDES SERIAL COMPARISON Confirmed by Christella Noa ([id number]) on 04/21/2019 11:43:19 AM    Xr Chest Portable    Result Date: 04/21/2019  EXAM: XR CHEST PORTABLE DATE: 04/20/2019 9:06 PM ACCESSION: [id number] UN DICTATED: 04/20/2019 9:15 PM INTERPRETATION LOCATION: [facility] CLINICAL INDICATION: 86 years old Female with ALTERED MENTAL STATUS  COMPARISON: 03/31/2019,  12/12/2009 TECHNIQUE: Portable Chest Radiograph. FINDINGS: Lungs radiographically clear. No pleural effusion or pneumothorax. Stable cardiomediastinal silhouette including tortuous descending thoracic aorta.     No acute airspace disease.    Ct Head Wo Contrast    Result Date: 04/20/2019  EXAM: Computed tomography, head or brain without contrast material. DATE: 04/20/2019 5:31 PM ACCESSION: [id number] UN DICTATED: 04/20/2019 5:35 PM INTERPRETATION LOCATION: [facility] CLINICAL INDICATION: 86 years old Female with code stroke ; Stroke, follow up; altered mental status COMPARISON: CT head from 03/30/2018. TECHNIQUE: Axial CT images of the head  from skull base to vertex without contrast. FINDINGS: No acute intraventricular hemorrhage. No acute infarct. Hypodensity noted in the left insula is favored to be chronic infarct versus perivascular space. Tiny hypodensities noted bilaterally in the cerebellum also favored to be sequelae of chronic infarct (and also noted on prior studies). Mild diffuse global cerebral atrophy. There are confluent areas of periventricular hypodensity, nonspecific, but favored to be sequela of chronic small vessel ischemic change in this patient age group. No midline shift. No mass lesion. No fractures. The sinuses are pneumatized. Dense carotid artery siphon calcifications.     -- No CT evidence for acute hemorrhage or infarct. Recommend correlation with symptoms and a follow-up MRI of the brain if clinically warranted. -- Chronic small vessel ischemic change. Intracranial atherosclerosis.    Mri Brain Wo Contrast    Result Date: 04/21/2019  EXAM: Magnetic resonance imaging, brain, without contrast material. DATE: 04/21/2019 6:37 PM ACCESSION: [id number] UN DICTATED: 04/21/2019 6:49 PM INTERPRETATION LOCATION: [facility] CLINICAL INDICATION: 86 years old Female with c/f encephalitis, AMS  COMPARISON: CT head without contrast 04/20/2019 TECHNIQUE: Multiplanar, multisequence MR imaging of the brain was performed without I.V. contrast. Multiple sequences degraded by motion particularly the susceptibility weighted images. FINDINGS:  Diffuse prominence of the CSF containing spaces consistent with age-related and involutional changes. There are scattered and confluent  foci of signal abnormality within the periventricular and deep white matter.  These are nonspecific but commonly seen with small vessel ischemic changes.  Ventricles are normal in size. There is no midline shift. No extra-axial fluid collection. No evidence of intracranial hemorrhage. No diffusion weighted signal abnormality to suggest acute infarct. No mass.     No acute intracranial findings.      Discharge Instructions:        Diet Instructions     Discharge diet (specify)      Discharge Nutrition Therapy: General          Follow Up instructions and Outpatient Referrals     Call MD for:  extreme fatigue      Call MD for:  severe uncontrolled pain      Call MD for: Temperature > 38.5 Celsius ( > 101.3 Fahrenheit)      Discharge instructions      Discharge to:  [facility]  [address]  [place] [address]   [phone]    Discharge instructions      I certify that based on my evaluation of this patient, this patient requires BLS transportation services and that other forms of transport are contraindicated.  Please refer to care management transitions note for transportation details.    Discharge instructions      Discharge to:  [facility]  [address]  [place] [address]  [phone]    Discharge instructions      I certify that based on my evaluation of this patient, this patient requires BLS transportation services and that other forms of  transport are contraindicated.  Please refer to care management transitions note for transportation details.    Discharge instructions      Hayley Wagner was hospitalized with altered mental status. We suspect her change in mentation was related to recent medication changes and uncontrolled post-herpetic pain related caused by recent shingles infection on her face. While in the [facility], she had an extensive workup that was not concerning for infection or stroke. Her pain was managed with low dose oxycodone, and scheduled tylenol, which she may continue taking. She likely has underlying mild cognitive impairment, and is prone to delirium. Delirium prevention will be especially important for her, including frequent reorientation, opening blinds and stimulating during daytime to help prevent sleep wake cycle dysregulation, ensuring adequate pain control.          -- Please draw CBC with differential on 05/12/19, as patient is on Jakafi for myelofibrosis, per her oncology team.   -- Please continue to monitor the resolution of her shingles infection on the right side of her face.   -- Patient has had continued mild delirium in the [facility], but please further monitor her cognitive status.  --Patient has been on narcotics, please monitor for constipation. If she goes 3 days without a bowel movement, please increase miralax to twice daily.   ??    Appointments which have been scheduled for you    May 09, 2019  9:30 AM  (Arrive by 9:15 AM)  LAB ONLY with HB LAB WATER 460  LAB PHLEB [facility]) [address]  [facility] [place] [address]  [phone]      May 09, 2019 10:30 AM  (Arrive by 10:15 AM)  BLOOD TRANSFUSION - 2 UNITS with ONCINF HMOB CHAIR 02  [facility]) [address]  [place] [address]  [phone]      May 15, 2019 12:45 PM  (Arrive by 12:15 PM)  RETURN VIDEO - OTHER with Vito Berger, MD  [facility]) [address]  [place] [address]  [phone]

## 2019-04-28 NOTE — Unmapped (Signed)
Pt had three bowel menvemts overnight.

## 2019-04-28 NOTE — Unmapped (Signed)
VSS. A & O times 1. Pt son was a bedside until 2000. Pt ate small amount of food. Pt had one medium bowel movement. Pt was med compliant. Pt had pain spasms on her face and lips from shingles. Pt was given PRN oxycodone 2.5mg . Pt bed alarm in activated. Bed at lowest position with activity belt near. Pt was oriented to call bell but needs reinforcement teaching.     Problem: Skin Injury Risk Increased  Goal: Skin Health and Integrity  04/28/2019 0121 by Loni Beckwith, RN  Outcome: Ongoing - Unchanged  04/28/2019 0120 by Loni Beckwith, RN  Outcome: Ongoing - Unchanged     Problem: Fall Injury Risk  Goal: Absence of Fall and Fall-Related Injury  04/28/2019 0121 by Loni Beckwith, RN  Outcome: Ongoing - Unchanged  04/28/2019 0120 by Loni Beckwith, RN  Outcome: Ongoing - Unchanged     Problem: Adult Inpatient Plan of Care  Goal: Plan of Care Review  04/28/2019 0121 by Loni Beckwith, RN  Outcome: Ongoing - Unchanged  04/28/2019 0120 by Loni Beckwith, RN  Outcome: Ongoing - Unchanged  Goal: Patient-Specific Goal (Individualization)  04/28/2019 0121 by Loni Beckwith, RN  Outcome: Ongoing - Unchanged  04/28/2019 0120 by Loni Beckwith, RN  Outcome: Ongoing - Unchanged  Goal: Absence of Hospital-Acquired Illness or Injury  04/28/2019 0121 by Loni Beckwith, RN  Outcome: Ongoing - Unchanged  04/28/2019 0120 by Loni Beckwith, RN  Outcome: Ongoing - Unchanged  Goal: Optimal Comfort and Wellbeing  04/28/2019 0121 by Loni Beckwith, RN  Outcome: Ongoing - Unchanged  04/28/2019 0120 by Loni Beckwith, RN  Outcome: Ongoing - Unchanged  Goal: Readiness for Transition of Care  04/28/2019 0121 by Loni Beckwith, RN  Outcome: Ongoing - Unchanged  04/28/2019 0120 by Loni Beckwith, RN  Outcome: Ongoing - Unchanged  Goal: Rounds/Family Conference  04/28/2019 0121 by Loni Beckwith, RN  Outcome: Ongoing - Unchanged  04/28/2019 0120 by Loni Beckwith, RN  Outcome: Ongoing - Unchanged     Problem: Self-Care Deficit Goal: Improved Ability to Complete Activities of Daily Living  04/28/2019 0121 by Loni Beckwith, RN  Outcome: Ongoing - Unchanged  04/28/2019 0120 by Loni Beckwith, RN  Outcome: Ongoing - Unchanged     Problem: Heart Failure Comorbidity  Goal: Maintenance of Heart Failure Symptom Control  04/28/2019 0121 by Loni Beckwith, RN  Outcome: Ongoing - Unchanged  04/28/2019 0120 by Loni Beckwith, RN  Outcome: Ongoing - Unchanged     Problem: Hypertension Comorbidity  Goal: Blood Pressure in Desired Range  04/28/2019 0121 by Loni Beckwith, RN  Outcome: Ongoing - Unchanged  04/28/2019 0120 by Loni Beckwith, RN  Outcome: Ongoing - Unchanged

## 2019-04-29 MED ORDER — VALACYCLOVIR 500 MG TABLET
Freq: Every day | ORAL | 0 refills | 0 days
Start: 2019-04-29 — End: ?

## 2019-05-01 NOTE — Unmapped (Signed)
CONTINUING CARE NETWORK  Enrollment Note        Patient admitted to [facility] and enrolled in the Contin[facility] Care [facility]. Case Management to follow up within 1 week to complete medication reconciliation and discuss transition planning.

## 2019-05-04 NOTE — Unmapped (Signed)
CONTINUING CARE NETWORK  SNF FOLLOW UP NOTE  Summary:  [place]ley [facility] is an 86 y.o. female patient currently at [facility] who is being followed by the [facility] Contin[facility] [employer]. Plan is continue rehab.  Next follow up with SNF care team member in one week.        Subjective:   Medication changes: Yes: see home medications. Medication list updated.    Care team member, Darel Hong, reported no discharge date.      Interventions provided: medication reconciliation, chart review and supportive listening  Education provided: to notify CM of any change in patient condition or discharge plan  Barriers to care: Transportation and Fall Risk  Care Coordination Note updated in [facility]: Yes    Discuss at next visit: Transition planning     Objective:    Patient Active Problem List   Diagnosis   ??? 2-vessel coronary artery disease   ??? Thoracoabdominal aneurysm (CMS-HCC)   ??? Chest pain, unspecified   ??? Chronic kidney disease, stage 3   ??? Descending thoracic aortic aneurysm (CMS-HCC)   ??? Ectatic thoracic aorta (CMS-HCC)   ??? Essential hypertension   ??? Hyperlipidemia   ??? IGT (impaired glucose tolerance)   ??? Increased frequency of urination   ??? Laxity of skin   ??? Palpitations   ??? Rectocele, female   ??? Rosacea   ??? Primary myelofibrosis (CMS-HCC)   ??? Altered mental status   ??? AKI (acute kidney injury) (CMS-HCC)   ??? UTI (urinary tract infection)   ??? Fall   ??? Shingles   ??? GERD (gastroesophageal reflux disease)       Future Appointments   Date Time Provider Department Center   05/09/2019  9:30 AM HB LAB WATER [facility]   05/09/2019 10:30 AM ONCINF HMOB CHAIR 02 [facility]   05/15/2019 12:45 PM Brandi Acey Lav, MD [id number]

## 2019-05-09 ENCOUNTER — Ambulatory Visit: Admit: 2019-05-09 | Discharge: 2019-05-09 | Payer: MEDICARE

## 2019-05-09 LAB — COMPREHENSIVE METABOLIC PANEL
ALBUMIN: 3.8 g/dL (ref 3.5–5.0)
ALKALINE PHOSPHATASE: 76 U/L (ref 38–126)
ALT (SGPT): 53 U/L — ABNORMAL HIGH (ref <35)
ANION GAP: 11 mmol/L (ref 7–15)
AST (SGOT): 69 U/L — ABNORMAL HIGH (ref 14–38)
BILIRUBIN TOTAL: 0.7 mg/dL (ref 0.0–1.2)
BLOOD UREA NITROGEN: 37 mg/dL — ABNORMAL HIGH (ref 7–21)
BUN / CREAT RATIO: 37
CALCIUM: 9.2 mg/dL (ref 8.5–10.2)
CHLORIDE: 104 mmol/L (ref 98–107)
CO2: 27 mmol/L (ref 22.0–30.0)
CREATININE: 1.01 mg/dL — ABNORMAL HIGH (ref 0.60–1.00)
EGFR CKD-EPI NON-AA FEMALE: 50 mL/min/{1.73_m2} — ABNORMAL LOW (ref >=60)
GLUCOSE RANDOM: 143 mg/dL (ref 70–179)
POTASSIUM: 4.2 mmol/L (ref 3.5–5.0)
PROTEIN TOTAL: 6.3 g/dL — ABNORMAL LOW (ref 6.5–8.3)
SODIUM: 142 mmol/L (ref 135–145)

## 2019-05-09 LAB — LYMPHOCYTES RELATIVE PERCENT: Lymphocytes/100 leukocytes:NFr:Pt:Bld:Qn:Automated count: 12.1

## 2019-05-09 LAB — CBC W/ AUTO DIFF
BASOPHILS ABSOLUTE COUNT: 0.1 10*9/L (ref 0.0–0.1)
BASOPHILS RELATIVE PERCENT: 1.3 %
EOSINOPHILS ABSOLUTE COUNT: 0.1 10*9/L (ref 0.0–0.7)
EOSINOPHILS RELATIVE PERCENT: 0.5 %
HEMATOCRIT: 28.4 % — ABNORMAL LOW (ref 35.0–44.0)
HEMOGLOBIN: 9.5 g/dL — ABNORMAL LOW (ref 12.0–15.5)
LYMPHOCYTES ABSOLUTE COUNT: 1.3 10*9/L (ref 0.7–4.0)
MEAN CORPUSCULAR HEMOGLOBIN CONC: 33.4 g/dL (ref 30.0–36.0)
MEAN CORPUSCULAR HEMOGLOBIN: 34 pg (ref 26.0–34.0)
MEAN CORPUSCULAR VOLUME: 102 fL — ABNORMAL HIGH (ref 82.0–98.0)
MEAN PLATELET VOLUME: 9.4 fL (ref 7.0–10.0)
MONOCYTES RELATIVE PERCENT: 12.4 %
NEUTROPHILS ABSOLUTE COUNT: 8 10*9/L — ABNORMAL HIGH (ref 1.7–7.7)
NEUTROPHILS RELATIVE PERCENT: 73.7 %
NUCLEATED RED BLOOD CELLS: 0 /100{WBCs} (ref <=4)
RED BLOOD CELL COUNT: 2.79 10*12/L — ABNORMAL LOW (ref 3.90–5.03)
RED CELL DISTRIBUTION WIDTH: 30.7 % — ABNORMAL HIGH (ref 12.0–15.0)
WBC ADJUSTED: 10.9 10*9/L — ABNORMAL HIGH (ref 3.5–10.5)

## 2019-05-09 LAB — SLIDE REVIEW

## 2019-05-09 LAB — BLOOD UREA NITROGEN: Urea nitrogen:MCnc:Pt:Ser/Plas:Qn:: 37 — ABNORMAL HIGH

## 2019-05-09 LAB — POLYCHROMASIA

## 2019-05-09 NOTE — Unmapped (Signed)
The [facility] has made a third and final attempt to reach this patient to refill the following medication:Jakafi 5mg .      We have been unable to leave messages on the following phone numbers: [phone] and [phone].    Dates contacted: 04/25/19,05/02/19, 05/09/19  Last scheduled delivery: 03/29/19    The patient may be at risk of non-compliance with this medication. The patient should call the [facility] at [phone] (option 4) to refill medication.    Wyatt Mage Hulda Humphrey   [facility] Specialty Technician

## 2019-05-09 NOTE — Unmapped (Signed)
Appointment on 05/09/2019   Component Date Value Ref Range Status   ??? Sodium 05/09/2019 142  135 - 145 mmol/L Final   ??? Potassium 05/09/2019 4.2  3.5 - 5.0 mmol/L Final   ??? Chloride 05/09/2019 104  98 - 107 mmol/L Final   ??? Anion Gap 05/09/2019 11  7 - 15 mmol/L Final   ??? CO2 05/09/2019 27.0  22.0 - 30.0 mmol/L Final   ??? BUN 05/09/2019 37* 7 - 21 mg/dL Final   ??? Creatinine 05/09/2019 1.01* 0.60 - 1.00 mg/dL Final   ??? BUN/Creatinine Ratio 05/09/2019 37   Final   ??? EGFR CKD-EPI Non-African American,* 05/09/2019 50* >=60 mL/min/1.46m2 Final   ??? EGFR CKD-EPI African American, Fem* 05/09/2019 57* >=60 mL/min/1.87m2 Final   ??? Glucose 05/09/2019 143  70 - 179 mg/dL Final   ??? Calcium 16/11/9602 9.2  8.5 - 10.2 mg/dL Final   ??? Albumin 54/10/8117 3.8  3.5 - 5.0 g/dL Final   ??? Total Protein 05/09/2019 6.3* 6.5 - 8.3 g/dL Final   ??? Total Bilirubin 05/09/2019 0.7  0.0 - 1.2 mg/dL Final   ??? AST 14/78/2956 69* 14 - 38 U/L Final   ??? ALT 05/09/2019 53* <35 U/L Final   ??? Alkaline Phosphatase 05/09/2019 76  38 - 126 U/L Final   ??? WBC 05/09/2019 10.9* 3.5 - 10.5 10*9/L Final   ??? RBC 05/09/2019 2.79* 3.90 - 5.03 10*12/L Final   ??? HGB 05/09/2019 9.5* 12.0 - 15.5 g/dL Final   ??? HCT 21/30/8657 28.4* 35.0 - 44.0 % Final   ??? MCV 05/09/2019 102.0* 82.0 - 98.0 fL Final   ??? MCH 05/09/2019 34.0  26.0 - 34.0 pg Final   ??? MCHC 05/09/2019 33.4  30.0 - 36.0 g/dL Final   ??? RDW 84/69/6295 30.7* 12.0 - 15.0 % Final   ??? MPV 05/09/2019 9.4  7.0 - 10.0 fL Final   ??? Platelet 05/09/2019 369  150 - 450 10*9/L Final   ??? nRBC 05/09/2019 0  <=4 /100 WBCs Final   ??? Neutrophils % 05/09/2019 73.7  % Final   ??? Lymphocytes % 05/09/2019 12.1  % Final   ??? Monocytes % 05/09/2019 12.4  % Final   ??? Eosinophils % 05/09/2019 0.5  % Final   ??? Basophils % 05/09/2019 1.3  % Final   ??? Absolute Neutrophils 05/09/2019 8.0* 1.7 - 7.7 10*9/L Final   ??? Absolute Lymphocytes 05/09/2019 1.3  0.7 - 4.0 10*9/L Final   ??? Absolute Monocytes 05/09/2019 1.3* 0.1 - 1.0 10*9/L Final   ??? Absolute Eosinophils 05/09/2019 0.1  0.0 - 0.7 10*9/L Final   ??? Absolute Basophils 05/09/2019 0.1  0.0 - 0.1 10*9/L Final   ??? Anisocytosis 05/09/2019 Marked* Not Present Final   ??? Smear Review Comments 05/09/2019 See Comment* Undefined Final    Smear reviewed   ??? Giant Platelets 05/09/2019 Present* Not Present Final   ??? Polychromasia 05/09/2019 Slight* Not Present Final

## 2019-05-09 NOTE — Unmapped (Signed)
Labs drawn by phlebotomy, no indication for transfusion at this time. AVS printed. PT DC under care of daughter/caregiver.

## 2019-05-11 NOTE — Unmapped (Signed)
CONTINUING CARE NETWORK  SNF FOLLOW UP NOTE  Summary:  Hayley Wagner is an 86 y.o. female patient currently at [facility] who is being followed by the [facility] [employer]. Plan is continue rehab.  Next follow up with SNF care team member in one week.        Subjective:   Medication changes: Yes: see below. Medication list updated.     05/08/2019:  Clonazepam disintegrating tablet 0.125 mg PO BID     Care team member, Darel Hong, reported no discharge date.      Interventions provided: medication reconciliation, chart review and supportive listening  Education provided: to notify CM of any change in patient condition or discharge plan  Barriers to care: Transportation and Fall Risk  Care Coordination Note updated in [facility]: Yes    Discuss at next visit: Transition planning     Objective:    Patient Active Problem List   Diagnosis   ??? 2-vessel coronary artery disease   ??? Thoracoabdominal aneurysm (CMS-HCC)   ??? Chest pain, unspecified   ??? Chronic kidney disease, stage 3   ??? Descending thoracic aortic aneurysm (CMS-HCC)   ??? Ectatic thoracic aorta (CMS-HCC)   ??? Essential hypertension   ??? Hyperlipidemia   ??? IGT (impaired glucose tolerance)   ??? Increased frequency of urination   ??? Laxity of skin   ??? Palpitations   ??? Rectocele, female   ??? Rosacea   ??? Primary myelofibrosis (CMS-HCC)   ??? Altered mental status   ??? AKI (acute kidney injury) (CMS-HCC)   ??? UTI (urinary tract infection)   ??? Fall   ??? Shingles   ??? GERD (gastroesophageal reflux disease)       Future Appointments   Date Time Provider Department Center   05/15/2019 12:45 PM Vito Berger, MD [id number]

## 2019-05-12 NOTE — Unmapped (Signed)
The [facility] has made a fourth and final attempt to reach this patient to refill the following medication:Jakafi 5mg .      We have been unable to leave messages on the following phone numbers: [phone] and [phone].    Dates contacted: 04/25/19,05/02/19, 05/09/19, 05/12/19  Last scheduled delivery: 03/29/19    The patient may be at risk of non-compliance with this medication. The patient should call the [facility] at [phone] (option 4) to refill medication.    Horace Porteous, PharmD  [facility]

## 2019-05-15 ENCOUNTER — Ambulatory Visit
Admit: 2019-05-15 | Discharge: 2019-05-20 | Disposition: A | Discharge status: Skilled Nursing Facility | Payer: MEDICARE | Source: Intra-hospital | Admitting: Geriatric Medicine

## 2019-05-15 ENCOUNTER — Telehealth: Admit: 2019-05-15 | Payer: MEDICARE | Attending: Internal Medicine | Primary: Internal Medicine

## 2019-05-15 DIAGNOSIS — Z5329 Procedure and treatment not carried out because of patient's decision for other reasons: Principal | ICD-10-CM

## 2019-05-15 DIAGNOSIS — D471 Chronic myeloproliferative disease: Principal | ICD-10-CM

## 2019-05-15 LAB — COMPREHENSIVE METABOLIC PANEL
ALBUMIN: 4.3 g/dL (ref 3.5–5.0)
ALKALINE PHOSPHATASE: 117 U/L (ref 38–126)
ALT (SGPT): 58 U/L — ABNORMAL HIGH (ref <35)
ANION GAP: 10 mmol/L (ref 7–15)
AST (SGOT): 79 U/L — ABNORMAL HIGH (ref 14–38)
BILIRUBIN TOTAL: 0.8 mg/dL (ref 0.0–1.2)
BLOOD UREA NITROGEN: 46 mg/dL — ABNORMAL HIGH (ref 7–21)
BUN / CREAT RATIO: 29
CALCIUM: 9.3 mg/dL (ref 8.5–10.2)
CHLORIDE: 111 mmol/L — ABNORMAL HIGH (ref 98–107)
CREATININE: 1.59 mg/dL — ABNORMAL HIGH (ref 0.60–1.00)
EGFR CKD-EPI AA FEMALE: 33 mL/min/{1.73_m2} — ABNORMAL LOW (ref >=60)
EGFR CKD-EPI NON-AA FEMALE: 29 mL/min/{1.73_m2} — ABNORMAL LOW (ref >=60)
GLUCOSE RANDOM: 126 mg/dL (ref 70–179)
POTASSIUM: 5.1 mmol/L — ABNORMAL HIGH (ref 3.5–5.0)
PROTEIN TOTAL: 6.8 g/dL (ref 6.5–8.3)
SODIUM: 145 mmol/L (ref 135–145)

## 2019-05-15 LAB — URINALYSIS
BILIRUBIN UA: NEGATIVE
GLUCOSE UA: NEGATIVE
NITRITE UA: NEGATIVE
PH UA: 5 (ref 5.0–9.0)
PROTEIN UA: 100 — AB
RBC UA: 64 /HPF — ABNORMAL HIGH (ref <=4)
SPECIFIC GRAVITY UA: 1.023 (ref 1.003–1.030)
SQUAMOUS EPITHELIAL: 8 /HPF — ABNORMAL HIGH (ref 0–5)
UROBILINOGEN UA: 4 — AB
WBC UA: >182 /HPF — ABNORMAL HIGH (ref 0–5)

## 2019-05-15 LAB — SLIDE REVIEW

## 2019-05-15 LAB — PHOSPHORUS: Phosphate:MCnc:Pt:Ser/Plas:Qn:: 4.6

## 2019-05-15 LAB — CBC W/ AUTO DIFF
BASOPHILS ABSOLUTE COUNT: 0.1 10*9/L (ref 0.0–0.1)
BASOPHILS RELATIVE PERCENT: 1.3 %
EOSINOPHILS ABSOLUTE COUNT: 0.1 10*9/L (ref 0.0–0.4)
EOSINOPHILS RELATIVE PERCENT: 0.9 %
HEMATOCRIT: 32.2 % — ABNORMAL LOW (ref 36.0–46.0)
HEMOGLOBIN: 10.1 g/dL — ABNORMAL LOW (ref 12.0–16.0)
LARGE UNSTAINED CELLS: 4 % (ref 0–4)
LYMPHOCYTES ABSOLUTE COUNT: 1.8 10*9/L (ref 1.5–5.0)
MEAN CORPUSCULAR HEMOGLOBIN CONC: 31.3 g/dL (ref 31.0–37.0)
MEAN CORPUSCULAR VOLUME: 111.8 fL — ABNORMAL HIGH (ref 80.0–100.0)
MEAN PLATELET VOLUME: 10.5 fL — ABNORMAL HIGH (ref 7.0–10.0)
MONOCYTES ABSOLUTE COUNT: 1.1 10*9/L — ABNORMAL HIGH (ref 0.2–0.8)
MONOCYTES RELATIVE PERCENT: 13.6 %
NEUTROPHILS RELATIVE PERCENT: 57.5 %
NUCLEATED RED BLOOD CELLS: 6 /100{WBCs} — ABNORMAL HIGH (ref <=4)
PLATELET COUNT: 456 10*9/L — ABNORMAL HIGH (ref 150–440)
RED BLOOD CELL COUNT: 2.88 10*12/L — ABNORMAL LOW (ref 4.00–5.20)
RED CELL DISTRIBUTION WIDTH: 26.3 % — ABNORMAL HIGH (ref 12.0–15.0)
WBC ADJUSTED: 7.7 10*9/L (ref 4.5–11.0)

## 2019-05-15 LAB — LACTATE BLOOD VENOUS
Lactate:SCnc:Pt:BldV:Qn:: 2.1 — ABNORMAL HIGH
Lactate:SCnc:Pt:BldV:Qn:: 2.2 — ABNORMAL HIGH

## 2019-05-15 LAB — TOXICOLOGY SCREEN, URINE
BARBITURATE SCREEN URINE: <200
BENZODIAZEPINE SCREEN, URINE: <200
COCAINE(METAB.)SCREEN, URINE: <150

## 2019-05-15 LAB — COCAINE(METAB.)SCREEN, URINE: Lab: <150

## 2019-05-15 LAB — MONOCYTES ABSOLUTE COUNT: Monocytes:NCnc:Pt:Bld:Qn:Automated count: 1.1 — ABNORMAL HIGH

## 2019-05-15 LAB — OSMOLALITY MEASURED: Osmolality:Osmol:Pt:Ser/Plas:Qn:: 320 — ABNORMAL HIGH

## 2019-05-15 LAB — ANION GAP: Anion gap 3:SCnc:Pt:Ser/Plas:Qn:: 10

## 2019-05-15 LAB — MAGNESIUM: Magnesium:MCnc:Pt:Ser/Plas:Qn:: 2.2

## 2019-05-15 LAB — TROPONIN I
Troponin I.cardiac:MCnc:Pt:Ser/Plas:Qn:: 0.129
Troponin I.cardiac:MCnc:Pt:Ser/Plas:Qn:: 0.134

## 2019-05-15 LAB — GLUCOSE UA: Glucose:MCnc:Pt:Urine:Qn:Test strip: NEGATIVE

## 2019-05-15 LAB — TEAR DROP CELLS

## 2019-05-15 LAB — THYROID STIMULATING HORMONE: Thyrotropin:ACnc:Pt:Ser/Plas:Qn:: 2.348

## 2019-05-15 NOTE — Unmapped (Signed)
Unable to reach patient at either number today, may be in a SNF.  Need to re-schedule.      Rozetta Nunnery, MD  Hematology

## 2019-05-16 LAB — PHOSPHORUS: Phosphate:MCnc:Pt:Ser/Plas:Qn:: 3.5

## 2019-05-16 LAB — BASIC METABOLIC PANEL
ANION GAP: 10 mmol/L (ref 7–15)
BLOOD UREA NITROGEN: 40 mg/dL — ABNORMAL HIGH (ref 7–21)
BUN / CREAT RATIO: 38
CALCIUM: 8.6 mg/dL (ref 8.5–10.2)
CHLORIDE: 113 mmol/L — ABNORMAL HIGH (ref 98–107)
CO2: 25 mmol/L (ref 22.0–30.0)
EGFR CKD-EPI NON-AA FEMALE: 47 mL/min/{1.73_m2} — ABNORMAL LOW (ref >=60)
GLUCOSE RANDOM: 83 mg/dL (ref 70–179)
POTASSIUM: 4 mmol/L (ref 3.5–5.0)
SODIUM: 148 mmol/L — ABNORMAL HIGH (ref 135–145)

## 2019-05-16 LAB — HEPATIC FUNCTION PANEL
ALBUMIN: 2.9 g/dL — ABNORMAL LOW (ref 3.5–5.0)
ALKALINE PHOSPHATASE: 96 U/L (ref 38–126)
ALT (SGPT): 47 U/L — ABNORMAL HIGH (ref <35)
BILIRUBIN DIRECT: 0.3 mg/dL (ref 0.00–0.40)
BILIRUBIN TOTAL: 0.5 mg/dL (ref 0.0–1.2)

## 2019-05-16 LAB — URINALYSIS
BACTERIA: NONE SEEN /HPF
BILIRUBIN UA: NEGATIVE
GLUCOSE UA: NEGATIVE
KETONES UA: 15 — AB
NITRITE UA: POSITIVE — AB
PH UA: 5.5 (ref 5.0–9.0)
PROTEIN UA: 100 — AB
SPECIFIC GRAVITY UA: 1.025 (ref 1.005–1.040)
SQUAMOUS EPITHELIAL: 7 /HPF — ABNORMAL HIGH (ref 0–5)
UROBILINOGEN UA: 1
WBC UA: >100 /HPF — ABNORMAL HIGH (ref 0–5)

## 2019-05-16 LAB — MEAN CORPUSCULAR HEMOGLOBIN CONC: Erythrocyte mean corpuscular hemoglobin concentration:MCnc:Pt:RBC:Qn:Automated count: 32.9

## 2019-05-16 LAB — CBC
HEMATOCRIT: 26.4 % — ABNORMAL LOW (ref 35.0–44.0)
HEMOGLOBIN: 8.7 g/dL — ABNORMAL LOW (ref 12.0–15.5)
MEAN CORPUSCULAR HEMOGLOBIN CONC: 32.9 g/dL (ref 30.0–36.0)
MEAN CORPUSCULAR HEMOGLOBIN: 34.6 pg — ABNORMAL HIGH (ref 26.0–34.0)
MEAN CORPUSCULAR VOLUME: 105.1 fL — ABNORMAL HIGH (ref 82.0–98.0)
MEAN PLATELET VOLUME: 9.3 fL (ref 7.0–10.0)
PLATELET COUNT: 337 10*9/L (ref 150–450)
RED BLOOD CELL COUNT: 2.51 10*12/L — ABNORMAL LOW (ref 3.90–5.03)
RED CELL DISTRIBUTION WIDTH: 30.3 % — ABNORMAL HIGH (ref 12.0–15.0)

## 2019-05-16 LAB — MAGNESIUM
MAGNESIUM: 2 mg/dL (ref 1.6–2.2)
Magnesium:MCnc:Pt:Ser/Plas:Qn:: 2

## 2019-05-16 LAB — LACTATE BLOOD VENOUS: Lactate:SCnc:Pt:BldV:Qn:: 0.7

## 2019-05-16 LAB — BILIRUBIN DIRECT: Bilirubin.glucuronidated+Bilirubin.albumin bound:MCnc:Pt:Ser/Plas:Qn:: 0.3

## 2019-05-16 LAB — SLIDE REVIEW

## 2019-05-16 LAB — EGFR CKD-EPI NON-AA FEMALE
Glomerular filtration rate/1.73 sq M.predicted.non black:ArVRat:Pt:Ser/Plas/Bld:Qn:Creatinine-based formula (CKD-EPI): 47 — ABNORMAL LOW

## 2019-05-16 LAB — VITAMIN B-12: Cobalamins:MCnc:Pt:Ser/Plas:Qn:: >1000 — ABNORMAL HIGH

## 2019-05-16 LAB — SMEAR REVIEW

## 2019-05-16 LAB — MUCUS

## 2019-05-16 NOTE — Unmapped (Signed)
Bed: 68-D  Expected date:   Expected time:   Means of arrival:   Comments:  EMS

## 2019-05-16 NOTE — Unmapped (Signed)
Pt resting in bed with bed alarm active and audible and lap belt secured in the front.  Pt alert unable to assess orientation, pt unable to follow commands.  Pt repositioned per protocol.

## 2019-05-16 NOTE — Unmapped (Signed)
Pt admitted to unit from main ED. Pt was drowsy on arrival and uncooperative with care. Pt remains free from falls. Bed alarm set for safety and lap belt is in place.   Problem: Adult Inpatient Plan of Care  Goal: Plan of Care Review  Outcome: Ongoing - Unchanged  Goal: Patient-Specific Goal (Individualization)  Outcome: Ongoing - Unchanged  Goal: Absence of Hospital-Acquired Illness or Injury  Outcome: Ongoing - Unchanged  Goal: Optimal Comfort and Wellbeing  Outcome: Ongoing - Unchanged  Goal: Readiness for Transition of Care  Outcome: Ongoing - Unchanged  Goal: Rounds/Family Conference  Outcome: Ongoing - Unchanged     Problem: Self-Care Deficit  Goal: Improved Ability to Complete Activities of Daily Living  Outcome: Ongoing - Unchanged     Problem: Skin Injury Risk Increased  Goal: Skin Health and Integrity  Outcome: Ongoing - Unchanged     Problem: Fall Injury Risk  Goal: Absence of Fall and Fall-Related Injury  Outcome: Ongoing - Unchanged     Problem: Asthma Comorbidity  Goal: Maintenance of Asthma Control  Outcome: Ongoing - Unchanged     Problem: COPD Comorbidity  Goal: Maintenance of COPD Symptom Control  Outcome: Ongoing - Unchanged     Problem: Diabetes Comorbidity  Goal: Blood Glucose Level Within Desired Range  Outcome: Ongoing - Unchanged     Problem: Heart Failure Comorbidity  Goal: Maintenance of Heart Failure Symptom Control  Outcome: Ongoing - Unchanged     Problem: Hypertension Comorbidity  Goal: Blood Pressure in Desired Range  Outcome: Ongoing - Unchanged     Problem: Obstructive Sleep Apnea Risk or Actual (Comorbidity Management)  Goal: Unobstructed Breathing During Sleep  Outcome: Ongoing - Unchanged     Problem: Pain Chronic (Persistent) (Comorbidity Management)  Goal: Acceptable Pain Control and Functional Ability  Outcome: Ongoing - Unchanged     Problem: Seizure Disorder Comorbidity  Goal: Maintenance of Seizure Control  Outcome: Ongoing - Unchanged     Problem: Wound  Goal: Optimal Wound Healing  Outcome: Ongoing - Unchanged

## 2019-05-16 NOTE — Unmapped (Signed)
Geriatric Medicine History and Physical    Assessment/Plan:    Principal Problem:    Altered mental status  Active Problems:    Primary myelofibrosis (CMS-HCC)    AKI (acute kidney injury) (CMS-HCC)    Elevated troponin      Hayley Wagner is a 86 y.o. female with history of primary myelofibrosis, postherpetic neuralgia, and multiple recent admissions for AMS and delirium who was admitted from her SNF for worsening AMS.    Altered mental status: Likely in the setting of an unknown degree of neurocognitive deficit.  Had an MRI brain on 2/26 which showed scattered small vessel ischemic changes suggesting underlying vascular dementia.  Has been declining over the last 3 months per family.  Two admissions already this year for multifactorial AMS with delirium.  Unknown etiology at this point, but presume she may have toxic/metabolic encephalopathy in the setting of UTI given her dirty UA, prerenal AKI, and polypharmacy (was recently started on ativan for purported akathisia at her SNF) in setting of likely underlying dementia.  Received IV fluids and ceftriaxone in the ED.  Hemodynamically stable.  ???Continue ceftriaxone for presumed UTI.   ???Follow-up urine culture and blood cultures  ???Hold home sedating medications (clonazepam, carbemazepine, oxycodone)    AKI with mild hyperkalemia: Baseline normal creatinine.  Suspect prerenal.  Received fluids in the ED.  ???AM BMP    Elevated troponin: No chest pain or ischemic changes on ECG.  Suspect demand ischemia in the setting of volume depletion and UTI. Troponin has already peaked.    Primary myelofibrosis: Diagnosed in 04/2018. Follows with Dr. Anise Wagner [facility]). Initially treated with hydroxyurea but then transitioned to ruxolitinib in 12/2018. Ruxolitinib 10 mg twice daily??was continued while inpatient, although patient refused many doses.  ???Continue ruxolitinib  ???Alert Dr. Anise Wagner to admission    Postherpetic neuralgia: Holding carbamazepine     FEN/GI/PPX: -- Diet: Regular  -- IVF: not indicated  -- DVT Ppx: lovenox  -- GI: not indicated  -- Monitor and replace electrolytes as needed  -- Lines: PIV    Code Status:  Full Code  ___________________________________________________________________    Chief Complaint  Chief Complaint   Patient presents with   ??? Altered Mental Status       HPI:  Hayley Wagner is a 86 y.o. female with history of primary myelofibrosis, postherpetic neuralgia, and 2 recent missions for AMS who is presenting from her SNF for continued AMS.    History obtained from the patient's daughter-in-law.  She says that Hayley Wagner has been declining consistently since the holidays.  Around Christmas she was living independently and able to take care of herself.  She unfortunately has had a rather swift decline over the last few months which have included 2 different hospitalizations for altered mental status, the first in early February which was due to a UTI in the second and again in early March which was thought to be due to a combination of polypharmacy, pain related to a recent shingles infection, and delirium.    Her daughter-in-law last saw her a week ago when she took her to [facility] for some basic labs.  She seemed more confused at that time and seemed to get worse within the course of couple hours, becoming more agitated, grabbing at things, and confusing family members.  She is not aware of her having any fevers, chills, chest pain, shortness of breath, abdominal pain, nausea/vomiting/diarrhea, or dysuria.  She notes that Hayley Wagner does have a habit  of drinking at least 1 glass of wine per night, and she wonders whether this might be contributing.    The ED providers spoke with staff at [facility] who say that Hayley Wagner has not significantly improved since her discharge on 3/5.  Apparently, about a week ago she was becoming increasingly restless (thought to be akathisia), and she was started on clonazepam with minimal improvement. Today apparently she was restless, fidgety, had poor p.o. intake and was significantly less talkative.    In the ED: Afebrile and hemodynamically stable, breathing comfortably on room air.  Labs notable for newly increased creatinine, elevated troponin, and infected appearing urine.  ECG without ischemic changes.  She received IV fluids and ceftriaxone in the ED.    Allergies:  Patient has no known allergies.    Medications:   Prior to Admission medications    Medication Dose, Route, Frequency   acetaminophen (TYLENOL) 500 MG tablet 1,000 mg, Oral, Every 8 hours   aluminum-magnesium hydroxide-simethicone (MAALOX MAX) 400-400-40 mg/5 mL suspension 30 mL, Oral, Every 6 hours PRN   ascorbic acid, vitamin C, (VITAMIN C) 250 MG tablet 250 mg, Oral, Daily (standard)   aspirin 81 MG chewable tablet 81 mg, Oral, Daily (standard)   bacitracin 500 unit/gram ointment Topical, 3 times a day (standard), Apply to lesion on right upper lip until resolved   betamethasone valerate (VALISONE) 0.1 % lotion 1 application, Topical, 2 times a day (standard)   carBAMazepine (TEGRETOL) 200 mg tablet 200 mg, Oral, 2 times a day (standard)   carboxymethylcellulose sodium (THERATEARS) 0.25 % Drop 1 drop, Both Eyes, 6 times a day   cholecalciferol, vitamin D3-50 mcg, 2,000 unit,, 50 mcg (2,000 unit) tablet 1 tablet, Oral, Daily (standard)   clonazePAM (KLONOPIN) 0.125 MG disintegrating tablet 0.125 mg, Oral, 2 times a day (standard)   ergocalciferol, vitamin D2, 50 mcg (2,000 unit) cap 1 capsule, Oral, Daily   lidocaine (LMX) 4 % cream Topical, 3 times a day PRN   lidocaine 2% viscous (XYLOCAINE) 2 % Soln 15 mL, Mouth, Every 4 hours PRN   melatonin 3 mg Tab 6 mg, Oral, Every evening, At 6pm   metroNIDAZOLE (METROGEL) 1 % gel Small amount as needed twice a day   multivitamin (TAB-A-VITE/THERAGRAN) per tablet 1 tablet, Oral, Daily (standard)   mupirocin (BACTROBAN) 2 % ointment Apply 1 application topically 2 (two) times daily. Behind R ear oxyCODONE (ROXICODONE) 10 mg immediate release tablet 1/2 tablet PO every 6 hours PRN    polyethylene glycol (MIRALAX) 17 gram packet 17 g, Oral, Daily (standard)   RESTASIS MULTIDOSE 0.05 % Drop 1 drop, Both Eyes, Nightly PRN   ruxolitinib (JAKAFI) 5 mg tablet 10 mg, Oral, 2 times a day (standard)   senna-docusate (PERICOLACE) 8.6-50 mg 2 tablets, Oral, At bedtime   simvastatin (ZOCOR) 20 MG tablet TAKE 1 TABLET BY MOUTH ONCE DAILY   valACYclovir (VALTREX) 500 MG tablet 500 mg, Oral, Daily (standard)       Medical History:  Past Medical History:   Diagnosis Date   ??? Anemia    ??? At risk for falls    ??? CAD (coronary artery disease)    ??? Cancer (CMS-HCC)    ??? Chronic kidney disease    ??? GERD (gastroesophageal reflux disease)    ??? Hyperlipidemia    ??? Hypertension        Surgical History:  No past surgical history on file.    Social History:  Tobacco use:   reports that  she has quit smoking. She has never used smokeless tobacco.  Alcohol use:   reports current alcohol use.  Drug use:  reports no history of drug use.  Living situation: the patient lives in a skilled nursing facility.    Family History:  No family history on file.    Review of Systems:  10 systems reviewed and are negative unless otherwise mentioned in HPI      Physical Exam:  Temp:  [35.8 ??C] 35.8 ??C  Heart Rate:  [64-99] 71  SpO2 Pulse:  [82-91] 91  Resp:  [13-19] 19  BP: (101-130)/(57-82) 101/57  SpO2:  [95 %-98 %] 97 %  There is no height or weight on file to calculate BMI.    GEN: Elderly female in NAD, sleeping in bed  EYES: EOMI  ENT: MMM  CV: RRR, no murmurs appreciated  PULM: Normal work of breathing on room air. No wheezing.   ABD: soft, NT/ND, +BS  EXT: No edema  NEURO: No focal deficits  PSYCH: Unable to evaluate.   GU: No CVA tenderness  MSK: No spinal tenderness    Test Results:  Data Review:  I have reviewed the labs and studies from the last 24 hours.    Imaging: Radiology studies were personally reviewed

## 2019-05-16 NOTE — Unmapped (Signed)
[facility] Emergency Department Provider Note      ED Course, Assessment and Plan     Initial Clinical Impression:    May 15, 2019 5:53 PM   Hayley Wagner is a 86 y.o. female with history of CAD, anemia, CKD, HTN, HLD presenting for altered mental status from nursing home.  Patient was recently discharged from [facility] after prolonged stay for altered mental status, was doing okay but not at her prior baseline, which was A&O x3, now with further decline as described below.    BP 128/67  - Pulse 84  - Temp 35.8 ??C (96.4 ??F) (Rectal)  - Resp 19  - SpO2 98%     Differential diagnosis includes AMS 2/2 recent ativan usage, delerium, UTI, unwitnessed fall w/ intracranial bleed, PNA or other infectious process, electrolyte abn. Will obtain altered mental status labs and urine.      ED Course:  ED Course as of May 15 1922   Mon May 15, 2019   1820 Will give fluid bolus.   Lactate, Venous(!): 2.1   1821 SINUS RHYTHM WITH OCCASIONAL PREMATURE VENTRICULAR BEATS  LOW VOLTAGE QRS  CANNOT RULE OUT ANTERIOR INFARCT , AGE UNDETERMINED  ABNORMAL ECG  WHEN COMPARED WITH ECG OF 20-Apr-2019 19:18,  PREMATURE VENTRICULAR BEATS ARE NOW PRESENT  NONSPECIFIC T WAVE ABNORMALITY, WORSE IN INFERIOR LEADS  NONSPECIFIC T WAVE ABNORMALITY NO LONGER EVIDENT IN LATERAL LEADS      1850 Urine with significant UTI, will give ceftriaxone.      1906 Creatinine(!): 1.59   1919 Troponin I(!!): 0.134   1920 CXR neg      1922 Patient with troponin leak, likely type II NSTEMI.  Also with UTI and AKI.  Will admit for these things and altered mental status.  MAO paged.           _____________________________________________________________________    The case was discussed with attending physician who is in agreement with the above assessment and plan    Dictation software was used while making this note. Please excuse any errors made with dictation software.    Additional Medical Decision Making     I have reviewed the vital signs and the nursing notes. Labs and radiology results that were available during my care of the patient were independently reviewed by me and considered in my medical decision making.     I independently visualized the EKG tracing if performed  I independently visualized the radiology images if performed  I reviewed the patient's prior medical records if available.  Additional history obtained from family if available    History     CHIEF COMPLAINT:   Chief Complaint   Patient presents with   ??? Altered Mental Status       HPI: Hayley Wagner is a 86 y.o. female with history of CAD, anemia, CKD, HTN, HLD presenting for altered mental status from nursing home.  Patient was recently discharged from [facility] after prolonged stay for altered mental status, was doing okay but not at her prior baseline, which was A&O x3. She has been needing assistance to walk, and has been confused, but over the past 1 day has had decreased PO intake & speech.  Of note, patient was experiencing restlessness, so NP at nursing home started her on Ativan 1 week ago.  She will not answer any my questions.    My attending, Dr. Dortha Schwalbe, spoke to [facility]:  Spoke with Lequita Halt at [facility]. Ms. Miguez has  been there since February of this year, she was alert and doing her own paperwork as of February, but then after getting COVID19 vaccine, she became altered and mumbling. Admitted 2/25-3/5 for this reason (on discharge paperwork, she was delirious and AAOx1 at time of discharge, thought to be related due to polypharmacy, pain due to shingles) and Lequita Halt reports that she has still not improved since discharge. Discussed with NP at [facility] - she notes that Ms. Bergey has had significant akathisia for about 1 week (which is when the NP first met her). Started on Ativan at that time to minimize movements without improvement in her akathisia. Today she was restless, fidgety, had poor PO intake and was talking less (was previously able to communicate and answer some questions about her past such as her job). Labs and urine at [facility] have been normal. No recent changes in medications other than the Ativan.     PAST MEDICAL HISTORY/PAST SURGICAL HISTORY:   Past Medical History:   Diagnosis Date   ??? Anemia    ??? At risk for falls    ??? CAD (coronary artery disease)    ??? Cancer (CMS-HCC)    ??? Chronic kidney disease    ??? GERD (gastroesophageal reflux disease)    ??? Hyperlipidemia    ??? Hypertension        No past surgical history on file.    MEDICATIONS:     Current Facility-Administered Medications:   ???  cefTRIAXone (ROCEPHIN) 1 g in sodium chloride 0.9 % (NS) 100 mL IVPB-connector bag, 1 g, Intravenous, Once, Griffith Citron  ???  lactated ringers bolus 1,000 mL, 1,000 mL, Intravenous, Once, Griffith Citron, 1,000 mL at 05/15/19 1842    Current Outpatient Medications:   ???  acetaminophen (TYLENOL) 500 MG tablet, Take 1,000 mg by mouth every eight (8) hours., Disp: , Rfl:   ???  aluminum-magnesium hydroxide-simethicone (MAALOX MAX) 400-400-40 mg/5 mL suspension, Take 30 mL by mouth every six (6) hours as needed for indigestion., Disp: 355 mL, Rfl: 0  ???  ascorbic acid, vitamin C, (VITAMIN C) 250 MG tablet, Take 250 mg by mouth daily., Disp: , Rfl:   ???  aspirin 81 MG chewable tablet, Chew 81 mg daily. , Disp: , Rfl:   ???  bacitracin 500 unit/gram ointment, Apply topically Three (3) times a day. Apply to lesion on right upper lip until resolved, Disp: 120 g, Rfl: 0  ???  betamethasone valerate (VALISONE) 0.1 % lotion, Apply 1 application topically Two (2) times a day. , Disp: , Rfl:   ???  carBAMazepine (TEGRETOL) 200 mg tablet, Take 200 mg by mouth Two (2) times a day., Disp: , Rfl:   ???  carboxymethylcellulose sodium (THERATEARS) 0.25 % Drop, Administer 1 drop to both eyes Six (6) times a day., Disp: , Rfl: 0  ???  cholecalciferol, vitamin D3-50 mcg, 2,000 unit,, 50 mcg (2,000 unit) tablet, Take 1 tablet by mouth daily., Disp: , Rfl:   ???  clonazePAM (KLONOPIN) 0.125 MG disintegrating tablet, Take 0.125 mg by mouth Two (2) times a day., Disp: , Rfl:   ???  ergocalciferol, vitamin D2, 50 mcg (2,000 unit) cap, Take 1 capsule by mouth daily. , Disp: , Rfl:   ???  lidocaine (LMX) 4 % cream, Apply topically Three (3) times a day as needed (Apply to face for face pain)., Disp: 30 g, Rfl: 0  ???  lidocaine 2% viscous (XYLOCAINE) 2 % Soln, 15 mL by Mouth route every four (  4) hours as needed (for mouth pain prior to eating)., Disp: 1 Bottle, Rfl: 0  ???  melatonin 3 mg Tab, Take 2 tablets (6 mg total) by mouth every evening. At 6pm, Disp: , Rfl: 0  ???  metroNIDAZOLE (METROGEL) 1 % gel, Small amount as needed twice a day, Disp: , Rfl:   ???  multivitamin (TAB-A-VITE/THERAGRAN) per tablet, Take 1 tablet by mouth daily. , Disp: , Rfl:   ???  mupirocin (BACTROBAN) 2 % ointment, Apply 1 application topically 2 (two) times daily. Behind R ear, Disp: , Rfl:   ???  oxyCODONE (ROXICODONE) 10 mg immediate release tablet, 1/2 tablet PO every 6 hours PRN, Disp: , Rfl:   ???  polyethylene glycol (MIRALAX) 17 gram packet, Take 17 g by mouth daily., Disp: , Rfl:   ???  RESTASIS MULTIDOSE 0.05 % Drop, Administer 1 drop to both eyes nightly as needed. , Disp: , Rfl:   ???  ruxolitinib (JAKAFI) 5 mg tablet, Take 10 mg by mouth Two (2) times a day., Disp: , Rfl:   ???  senna-docusate (PERICOLACE) 8.6-50 mg, Take 2 tablets by mouth at bedtime., Disp: , Rfl:   ???  simvastatin (ZOCOR) 20 MG tablet, TAKE 1 TABLET BY MOUTH ONCE DAILY, Disp: , Rfl:   ???  valACYclovir (VALTREX) 500 MG tablet, Take 1 tablet (500 mg total) by mouth daily., Disp: , Rfl: 0    ALLERGIES:   Patient has no known allergies.    SOCIAL HISTORY:   Social History     Tobacco Use   ??? Smoking status: Former Smoker   ??? Smokeless tobacco: Never Used   Substance Use Topics   ??? Alcohol use: Yes       FAMILY HISTORY:  No family history on file.       Review of Systems    Unable to obtain due to mental status.    Physical Exam     VITAL SIGNS:    BP 128/67 - Pulse 84  - Temp 35.8 ??C (96.4 ??F) (Rectal)  - Resp 19  - SpO2 98%     Constitutional: Verbally non-responsive, but awake w/ eyes open  Eyes: Conjunctivae are normal.  ENT       Head: Normocephalic and atraumatic, healing shingles lesions over right face.       Nose: No congestion.       Mouth/Throat: Mucous membranes are moist.       Neck: No stridor.  Cardiovascular: Normal rate, regular rhythm.   Respiratory: Normal respiratory effort. Breath sounds are normal.  Gastrointestinal: Soft and nontender.   Genitourinary: No suprapubic tenderness  Musculoskeletal: Normal range of motion in all extremities.   Neurologic: unable to assess fully, but pt moving all extremities  Skin: Skin is warm, dry. Small stage I ulcer on the right glute, no erythema, warmth, or discharge, healing zoster on right side of face.  Psychiatric: Agitated mood when attempting to exam      Radiology     XR Chest Portable   Preliminary Result      No acute airspace disease.        Ecg 12 Lead    Result Date: 05/15/2019  SINUS RHYTHM WITH OCCASIONAL PREMATURE VENTRICULAR BEATS LOW VOLTAGE QRS CANNOT RULE OUT ANTERIOR INFARCT  , AGE UNDETERMINED ABNORMAL ECG WHEN COMPARED WITH ECG OF 20-Apr-2019 19:18, PREMATURE VENTRICULAR BEATS ARE NOW PRESENT NONSPECIFIC T WAVE ABNORMALITY, WORSE IN INFERIOR LEADS NONSPECIFIC T WAVE ABNORMALITY NO LONGER EVIDENT IN LATERAL LEADS  QT HAS SHORTENED    Xr Chest Portable    Result Date: 05/15/2019  EXAM: XR CHEST PORTABLE DATE: 05/15/2019 6:44 PM ACCESSION: [id number] UN DICTATED: 05/15/2019 6:58 PM INTERPRETATION LOCATION: [facility] CLINICAL INDICATION: 86 years old Female with AMS ; OTHER  COMPARISON: Chest radiograph 04/20/2019. TECHNIQUE: Portable Chest Radiograph. FINDINGS: Lungs radiographically clear. No pleural effusion or pneumothorax. Stable cardiomediastinal silhouette including tortuous descending thoracic aorta.     No acute airspace disease.          Pertinent labs & imaging results that were available during my care of the patient were reviewed by me and considered in my medical decision making (see chart for details).    Please note- This chart has been created using [employer]. Chart creation errors have been sought, but may not always be located and such creation errors, especially pronoun confusion, do NOT reflect on the standard of medical care.     Griffith Citron  Resident  05/15/19 1925

## 2019-05-16 NOTE — Unmapped (Signed)
Patient BIB EMS with c/c of AMS x3 days. Patient is arousable to touch. BG 177. VSS. Baseline mental status is alert and oriented.

## 2019-05-16 NOTE — Unmapped (Signed)
PHYSICAL THERAPY  Evaluation (05/16/19 0950)     Patient Name:  Shantay Sonn Putzier       Medical Record Number: [id number]   Date of Birth: [birth year]  Sex: Female            Treatment Diagnosis: Ipaired mental status and generalized weakness impairing functional mobility    ASSESSMENT  Problem List: Decreased cognition, Gait deviation, Impaired balance, Fall Risk, Impaired ADLs, Decreased mobility, Decreased strength, Decreased safety awareness, Postural Weakness, Decreased endurance, Core weakness     Assessment : Darriel Sinquefield is a 86 y.o. female with history of primary myelofibrosis, postherpetic neuralgia, and multiple recent admissions for AMS and delirium who was admitted from her SNF for worsening AMS.      Pt presents to PT with decreased cognition, flat affect, restlessness, and generalized weakness impairing pt's independence with functional mobility and would benefit from acute PT services to address.  Pt undressed in bed at beginning of session and was cooperative during mobility assessment and transfer to [facility], donning clean gown and able to void.  Pt returned to bed with covers and lap belt in place.  Recommend post acute services 5x/low.     Today's Interventions: AMPAC 15/20; Eval and POC; paitent education on role of PT and progression of PT services; bed mobility and transfer training, sitting/standing balance, increased time due to decreased processing.      PLAN  Planned Frequency of Treatment:  1-2x per day for: 3-4x week      Planned Interventions: Balance activities, Education - Patient, Endurance activities, Functional mobility, Home exercise program, Self-care / Home training, Therapeutic exercise, Therapeutic activity, Transfer training    Post-Discharge Physical Therapy Recommendations:  5x weekly, Low intensity    PT DME Recommendations: Defer to post acute           Goals:   Patient and Family Goals: not stated    Rodd Heft Term Goal #1: 4 weeks: Patient will ambulate at least 250 feet, LRAD, supervision for improved community access, dynamic balance, and muscular endurance       SHORT GOAL #1: Pt will perform bed mobility with Mod I assistance.              Time Frame : 2 weeks  SHORT GOAL #2: Pt will perform all OOB transfers with SBA and LRAD.              Time Frame : 2 weeks  SHORT GOAL #3: Patient will ambulate at least 30 feet, LRAD, SBA.              Time Frame : 2 weeks     Prognosis:  Fair  Positive Indicators: able to engage with PT  Barriers to Discharge: Cognitive deficits, Impaired Balance, Inability to safely perform ADLS, Functional strength deficits, Decreased safety awareness, Severity of deficits, Impulsivity, Gait instability, Pain    SUBJECTIVE  Patient reports: Pt awake with eyes open, very minimal verbal communication.  Current Functional Status: Pt received in bed, undressed and writhing with lap belt donned and bed alarm active, pt left dressed with blankets and safety measures in place.  Services patient receives: PT, OT  Prior Functional Status: Pt previously living independently in her own home with support from neighbor's and ex-daughter in law, but after most recent discharge pt at SNF and per H/P pt with little improvement.        Past Medical History:   Diagnosis Date   ??? Anemia    ???  At risk for falls    ??? CAD (coronary artery disease)    ??? Cancer (CMS-HCC)    ??? Chronic kidney disease    ??? GERD (gastroesophageal reflux disease)    ??? Hyperlipidemia    ??? Hypertension     Social History     Tobacco Use   ??? Smoking status: Former Smoker   ??? Smokeless tobacco: Never Used   Substance Use Topics   ??? Alcohol use: Yes      No past surgical history on file. History reviewed. No pertinent family history.     Allergies: Patient has no known allergies.     Objective Findings  Precautions / Restrictions  Precautions: Falls precautions  Weight Bearing Status: Non-applicable  Required Braces or Orthoses: Non-applicable    Communication Preference: Verbal   Pain Comments: Pt affirming she has pain, points to backside when asked where she hurts.  Medical Tests / Procedures: reviewed in chart  Equipment / Environment: Vascular access (PIV, TLC, Port-a-cath, PICC), Patient not wearing mask for full session    At Rest: VSS per EPIC, RN cleared  With Activity: NAD  Orthostatics: asymptomatic  Airway Clearance: functional mobility    Living Situation  Living Environment: Facility(currently from SNF)  Lives With: Care Staff     Cognition: Alert, unable to state where she is but shook her head no when asked if she was currently at home, impulsive, follows simple commands.  Visual / Perception Status: reports she wears glasses (not available)  Skin Inspection: visible areas c/d/i    UE ROM: WFL  UE Strength: WFL  LE ROM: WFL  LE Strength: WFL                Coordination: gross motor movements intact      Sensation: appears intact  Balance: Sitting: CGA; Standing: Min A         Bed Mobility: Supine-sit EOB with CGA.  Transfers: Sit<>stand with CGA, did not stand fully upright prior to pivot to [facility].   Gait  Level of Assistance: Minimal assist, patient does 75% or more  Assistive Device: None  Distance Ambulated (ft): 0 ft  Gait: Stand pivot transfer from bed to [facility], cues for hand placement, impulsive with return to bed, moving while NA wiping pt.      Endurance: decreased activity tolerance    Physical Therapy Session Duration  PT Individual - Duration: 38    Medical Staff Made Aware: RN Kathlene November    I attest that I have reviewed the above information.  Signed: Anders Grant, PT  Filed 05/16/2019

## 2019-05-16 NOTE — Unmapped (Addendum)
Care Management  Initial Transition Planning Assessment              CM spoke with pt's care team at [facility], reviewed pt's chart, and received report at CAPP rounds. Pt will likely return to SNF, where she has been since February.      General  Care Manager assessed the patient by : Medical record review, Discussion with Clinical Care team(Conversation with SNF staff)  Orientation Level: Other (Comment)(unable to assess. Pt was asleep. Wearing lapbelt.)  Functional level prior to admission: Dependent  Who provides care at home?: N/A  Level of assistance required: Bathing, Continence, Dressing, Grooming, Toileting, Transferring, Walking  Reason for referral: Discharge Planning    Contact/Decision Maker  Extended Emergency Contact Information  Primary Emergency Contact: Remer Macho  Mobile Phone: [phone]  Relation: Daughter in [place]  Preferred language: ENGLISH  Interpreter needed? No  Secondary Emergency Contact: FischerDickie La  Mobile Phone: [phone]  Relation: Son    Legal Next of Kin / Guardian / POA / Advance Directives   Advance Directive (Medical Treatment)  Does patient have an advance directive covering medical treatment?: Unable to assess (Pt. cognitively impaired, and/or unaccompanied).    Health Care Decision Maker [HCDM] (Medical & Mental Health Treatment)  Healthcare Decision Maker: Patient needs follow-up to appoint a Health Care Decision Maker.  Information offered on HCDM, Medical & Mental Health advance directives:: Other (Comment)    Advance Directive (Mental Health Treatment)  Does patient have an advance directive covering mental health treatment?: Unable to assess (Pt. cognitively impaired, and/or unaccompanied).    Patient Information  Lives with: Other (Comment)(Currently at SNF ([facility]). Pt was at home prior to February 2021.)    Type of Residence: SNF (Skilled nursing facility)  Facility (Name/Phone #): [facility]/ [phone] Return to facility?: Yes  Location/Detail: [address] [place] [address]     Type of Residence: Mailing Address:  [address]  [place] [address]  Patient Phone Number: [phone] (home)         Medical Provider(s): Caryl Asp, FNP  Reason for Admission: Admitting Diagnosis:  Delirium [R41.0]  NSTEMI (non-ST elevated myocardial infarction) (CMS-HCC) [I21.4]  AKI (acute kidney injury) (CMS-HCC) [N17.9]  Acute cystitis without hematuria [N30.00]  Past Medical History:   has a past medical history of Anemia, At risk for falls, CAD (coronary artery disease), Cancer (CMS-HCC), Chronic kidney disease, GERD (gastroesophageal reflux disease), Hyperlipidemia, and Hypertension.  Past Surgical History:   has no past surgical history on file.   Previous admit date: 04/20/2019    Primary Insurance- Payor: MEDICARE / Plan: MEDICARE PART A AND PART B / Product Type: *No Product type* /   Secondary Insurance ??? Secondary Insurance  AARP  Prescription Coverage ??? Medicare D/AARP  Preferred Pharmacy - [id number] - [place], [place] - [address] AT [facility] [address] [facility]  [facility]    Transportation home: Medical Transport-BLS to SNF    Support Systems/Concerns: Family Members, Children    Responsibilities/Dependents at home?: No    Home Care services in place prior to admission?: No. When she was at home, she hired housekeeping assistance.    Currently receiving outpatient dialysis?: No    Financial Information  Pt receives retirement income. Pt was also reportedly teaching music until recently.      Need for financial assistance?: No  Social Determinants of Health  Social History     Socioeconomic History   ??? Marital status: Widowed     Spouse name: None   ??? Number of children: None   ??? Years of education: None   ??? Highest education level: None   Occupational History   ??? None   Social Needs   ??? Financial resource strain: Not very hard   ??? Food insecurity     Worry: Never true     Inability: Never true   ??? Transportation needs     Medical: No     Non-medical: No   Tobacco Use   ??? Smoking status: Former Smoker   ??? Smokeless tobacco: Never Used   Substance and Sexual Activity   ??? Alcohol use: Yes   ??? Drug use: Never   ??? Sexual activity: None   Lifestyle   ??? Physical activity     Days per week: None     Minutes per session: None   ??? Stress: None   Relationships   ??? Social Wellsite geologist on phone: None     Gets together: None     Attends religious service: None     Active member of club or organization: None     Attends meetings of clubs or organizations: None     Relationship status: Widowed   Other Topics Concern   ??? None   Social History Narrative   ??? None     Housing/Utilities   ??? Within the past 12 months, have you ever stayed: outside, in a car, in a tent, in an overnight shelter, or temporarily in someone else's home (i.e. couch-surfing)? No    ??? Are you worried about losing your housing? No    ??? Within the past 12 months, have you been unable to get utilities (heat, electricity) when it was really needed? No      Literacy   ??? How often do you need to have someone help you when you read instructions, pamphlets, or other written material from your doctor or pharmacy? Sometimes        Discharge Needs Assessment  Concerns to be Addressed: care coordination/care conferences, discharge planning    Clinical Risk Factors: > 65, Multiple Diagnoses (Chronic), History of Falls, Functional Limitations, Inability to do Teach Back, Readmission < 30 Days    Barriers to taking medications: No    Prior overnight hospital stay or ED visit in last 90 days: Yes    Readmission Within the Last 30 Days: unable to assess    Anticipated Changes Related to Illness: none    Equipment Needed After Discharge: none    Discharge Facility/Level of Care Needs: nursing facility, skilled    Readmission  Risk of Unplanned Readmission Score: UNPLANNED READMISSION SCORE: 23% Predictive Model Details           22% (Medium) Factors Contributing to Score   Calculated 05/16/2019 14:58 21% Number of active Rx orders is 33   [place] Risk of Unplanned Readmission Model 15% Number of ED visits in last six months is 3     9% Number of hospitalizations in last year is 2     9% ECG/EKG order is present in last 6 months     7% Latest BUN is high (40 mg/dL)     6% Age is 89     6% Imaging order is present in last 6 months     5% Latest hemoglobin is low (  8.7 g/dL)     5% Phosphorous result is present     4% Active anticoagulant Rx order is present     4% Active corticosteroid Rx order is present     4% Latest creatinine is high (1.06 mg/dL)     2% Charlson Comorbidity Index is 2     2% Future appointment is scheduled     1% Current length of stay is 0.81 days     Readmitted Within the Last 30 Days? (No if blank) Yes  Patient at risk for readmission?: Yes    Discharge Plan  Screen findings are: Discharge planning needs identified or anticipated (Comment).(Coordinate pt's return to SNF)    Expected Discharge Date: 05/20/2019    Expected Transfer from Critical Care: (N/A)    Quality data for continuing care services shared with patient and/or representative?: N/A  Patient and/or family were provided with choice of facilities / services that are available and appropriate to meet post hospital care needs?: N/A   List choices in order highest to lowest preferred, if applicable. : Return to [facility].    Initial Assessment complete?: Yes      Leafy Ro, LCSW, Inpatient Case Manager, May 16, 2019 3:17 PM

## 2019-05-16 NOTE — Unmapped (Signed)
Advance Care Planning    Participants: [name] (daughter-in-law), Hubbard Robinson (intern)    Discussion:  Obtained additional history from Dr. Bjorn Pippin daughter-in-law.  Appears that there has been a slow but steady decline over the past year. While Dr. Chandra Batch has been at home on her own, performing her own ADLs, she does have a housekeeper that comes a few times a week. Her housekeeper has been picking up additional tasks and helping out the patient increasingly over the past year.  Mentions that in July at her last CT surgery appointment at [facility], Dr. Chandra Batch requested to have a wheelchair which was entirely out of character.  Inetta Fermo mentions that at Thanksgiving, Dr. Chandra Batch did not recognize a family member - this person normally takes her to many of her doctors appointments.  There has been some suspicion that she was developing dementia, but the feelings are mixed within the family.    Seeing Dr. Chandra Batch in this state has been especially distressing for the family.  Her hopes are that the patient will be back to a state where she can at least have a conversation and ask what her wishes are. Bennie Hind states, when the time comes, that she hopes that Jyllian can go with dignity .  Says that she does know Dr. Chandra Batch would not want a feeding tube or resuscitative measures.  Did relay to the family that Dr. Chandra Batch was full code at the moment -Bennie Hind will check with Dr. Bjorn Pippin two adult sons before making a decision.    Actions: Patient will remain full code for now pending further discussion between family.    Hubbard Robinson  Internal Medicine PGY-1

## 2019-05-16 NOTE — Unmapped (Signed)
[name] had been in a SNF that provided her the ruxolitinib, but just admitted yesterday for ams.  [facility] pharmacy will be notified by CPP once patient is discharged and then [facility] will resume sending her ruxolitinib.

## 2019-05-17 LAB — COMPREHENSIVE METABOLIC PANEL
ALBUMIN: 3.5 g/dL (ref 3.5–5.0)
ALKALINE PHOSPHATASE: 115 U/L (ref 38–126)
ALT (SGPT): 51 U/L — ABNORMAL HIGH (ref <35)
ANION GAP: 12 mmol/L (ref 7–15)
AST (SGOT): 57 U/L — ABNORMAL HIGH (ref 14–38)
BILIRUBIN TOTAL: 0.7 mg/dL (ref 0.0–1.2)
BLOOD UREA NITROGEN: 39 mg/dL — ABNORMAL HIGH (ref 7–21)
BUN / CREAT RATIO: 41
CALCIUM: 9.1 mg/dL (ref 8.5–10.2)
CHLORIDE: 113 mmol/L — ABNORMAL HIGH (ref 98–107)
CO2: 25 mmol/L (ref 22.0–30.0)
CREATININE: 0.95 mg/dL (ref 0.60–1.00)
EGFR CKD-EPI AA FEMALE: 62 mL/min/{1.73_m2} (ref >=60)
EGFR CKD-EPI NON-AA FEMALE: 54 mL/min/{1.73_m2} — ABNORMAL LOW (ref >=60)
POTASSIUM: 4 mmol/L (ref 3.5–5.0)
PROTEIN TOTAL: 6 g/dL — ABNORMAL LOW (ref 6.5–8.3)
SODIUM: 150 mmol/L — ABNORMAL HIGH (ref 135–145)

## 2019-05-17 LAB — PHOSPHORUS: Phosphate:MCnc:Pt:Ser/Plas:Qn:: 3.1

## 2019-05-17 LAB — HIV ANTIGEN/ANTIBODY COMBO: HIV 1+2 Ab+HIV1 p24 Ag:PrThr:Pt:Ser/Plas:Ord:IA: NONREACTIVE

## 2019-05-17 LAB — CBC
HEMATOCRIT: 28.2 % — ABNORMAL LOW (ref 35.0–44.0)
HEMOGLOBIN: 9.1 g/dL — ABNORMAL LOW (ref 12.0–15.5)
MEAN CORPUSCULAR HEMOGLOBIN CONC: 32.4 g/dL (ref 30.0–36.0)
MEAN CORPUSCULAR HEMOGLOBIN: 34.9 pg — ABNORMAL HIGH (ref 26.0–34.0)
MEAN CORPUSCULAR VOLUME: 107.6 fL — ABNORMAL HIGH (ref 82.0–98.0)
PLATELET COUNT: 355 10*9/L (ref 150–450)
RED BLOOD CELL COUNT: 2.62 10*12/L — ABNORMAL LOW (ref 3.90–5.03)
RED CELL DISTRIBUTION WIDTH: 31.1 % — ABNORMAL HIGH (ref 12.0–15.0)
WBC ADJUSTED: 7.7 10*9/L (ref 3.5–10.5)

## 2019-05-17 LAB — PLATELET COUNT: Platelets:NCnc:Pt:Bld:Qn:Automated count: 355

## 2019-05-17 LAB — BUN / CREAT RATIO: Urea nitrogen/Creatinine:MRto:Pt:Ser/Plas:Qn:: 41

## 2019-05-17 LAB — MAGNESIUM: Magnesium:MCnc:Pt:Ser/Plas:Qn:: 1.9

## 2019-05-17 NOTE — Unmapped (Signed)
Pt vss. Pt remains disoriented to time, place and situation. Pt confused and did not rest well overnight. Pt remains free from falls. Lap belt in place. Bed alarm set for safeyt. IV abx infused without difficulty. After IV abx pt removed IV.   Problem: Adult Inpatient Plan of Care  Goal: Plan of Care Review  Outcome: Ongoing - Unchanged  Goal: Patient-Specific Goal (Individualization)  Outcome: Ongoing - Unchanged  Goal: Absence of Hospital-Acquired Illness or Injury  Outcome: Ongoing - Unchanged  Goal: Optimal Comfort and Wellbeing  Outcome: Ongoing - Unchanged  Goal: Readiness for Transition of Care  Outcome: Ongoing - Unchanged  Goal: Rounds/Family Conference  Outcome: Ongoing - Unchanged     Problem: Self-Care Deficit  Goal: Improved Ability to Complete Activities of Daily Living  Outcome: Ongoing - Unchanged     Problem: Skin Injury Risk Increased  Goal: Skin Health and Integrity  Outcome: Ongoing - Unchanged     Problem: Fall Injury Risk  Goal: Absence of Fall and Fall-Related Injury  Outcome: Ongoing - Unchanged     Problem: Asthma Comorbidity  Goal: Maintenance of Asthma Control  Outcome: Ongoing - Unchanged     Problem: COPD Comorbidity  Goal: Maintenance of COPD Symptom Control  Outcome: Ongoing - Unchanged     Problem: Diabetes Comorbidity  Goal: Blood Glucose Level Within Desired Range  Outcome: Ongoing - Unchanged     Problem: Heart Failure Comorbidity  Goal: Maintenance of Heart Failure Symptom Control  Outcome: Ongoing - Unchanged     Problem: Hypertension Comorbidity  Goal: Blood Pressure in Desired Range  Outcome: Ongoing - Unchanged     Problem: Obstructive Sleep Apnea Risk or Actual (Comorbidity Management)  Goal: Unobstructed Breathing During Sleep  Outcome: Ongoing - Unchanged     Problem: Pain Chronic (Persistent) (Comorbidity Management)  Goal: Acceptable Pain Control and Functional Ability  Outcome: Ongoing - Unchanged     Problem: Seizure Disorder Comorbidity  Goal: Maintenance of Seizure Control  Outcome: Ongoing - Unchanged     Problem: Wound  Goal: Optimal Wound Healing  Outcome: Ongoing - Unchanged

## 2019-05-17 NOTE — Unmapped (Addendum)
Hayley Wagner is a 86 y.o. female with a PMHx of primary myelofibrosis, postherpetic neuralgia and multiple recent admissions for AMS that presented to [facility] from SNF with worsening AMS, found to have hyperactive delirium.  ??  Persistent delirium - multifactorial   On admission, patient was hyperactive and disoriented, consistent with hyperactive delirium. Centrally acting medications (carbamazepime, clonazepam, oxycodone) were held on admission. UA indicative of infection, see below which was treated with IV antibiotics.  Was reported to staff patient had not been sleeping, so we will Ruxololitinib was held as well due to possible documented side effect of insomnia.  She was also started on pregabalin and Cymbalta for her postherpetic neuralgia.  Her mental status improved to an easily redirectable, waxing and waning delirium.  Likely etiology some underlying dementia, as additional history obtained from family indicative of a ~1 year decline in functional status and cognition.  In addition, additional insults with uncontrolled pain from her postherpetic neuralgia, insomnia, centrally acting medications, and possible UTI contributing to cause her hyperactive delirium from admission. High suspicion that benzodiazepines contributed to worsening delirium.    Acute uncomplicated cystitis, UA obtained on admission significant for infection, was started on IV ceftriaxone.  Urine culture grew Pseudomonas and Enterococcus faecium, was transitioned to vancomycin/cefepime to complete a 3-day course on 3/26.    Postherpetic neuralgia, initially held carbamazepine and oxycodone on admission due to hyperactive delirium.  Trialed Cymbalta 20 mg and pregabalin 50 mg nightly with dramatic improvement in her pain and ability to sleep.  Maintained on valacyclovir, and topical lidocaine cream to the affected area.    Hypernatremia, peaked at 152, secondary to poor p.o. intake in the setting of her hyperactive delirium. Received some D5, with improvement in her sodium.  As her delirium improved, she was able to take more p.o. and sodium stayed stable.    Primary myelofibrosis??  Diagnosed in 04/2018. Follows with Dr. [name] [facility] Heme/Onc). She was initially treated with hydroxurea, but transitioned ruxolitinib in 12/2018. This was held on admission due to concern for this medication contributing to her insomnia.  On discharge ruxolitinib was held.    PCP Post-Discharge Follow Up Issues:   ***   To contact the discharging attending physician with regard to the patient's hospitalization or follow-up needs, please call: [facility] Division of Geriatrics [phone]).       **GERIATRIC ASSESSMENT:     Patient has had a significant decline in her mental status over the last 2-3 months. She has had two recent hospitalizations for delirium. In talking to [name], one of the patient's sons, he says he has noticed a steady decline in mentation over the last year. He feels that she has been able to conceal these deficits from family, as she was relatively independent and functional prior to her first hospitalization in early February. She was living alone at that time with a caregiver coming a few times a week. Since these recent hospitalizations, she has been living at a skilled nursing facility, and her son feels that she really has not returned to baseline. She has had some level of delirium or altered mental status since then. 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       Geriatric ROS  Vision impairment:   yes Hearing impairment:   none Swallowing impairment:  Difficult to assess. Patient reports some mouth pain when asked, but unclear if she understands the question. Swallowing thin liquids  without difficulty or signs of pain at this time.   Urinary incontinence:  Yes Fecal incontinence:   yes Constipation:   Abdominal xray on admission showed medium stool burden.    Falls/Gait: PHQ 2 or 9  Unable to assess. Patient is delirious on exam    Sleep disturbance:   Patient has not been sleeping well over the last few weeks and has been overall very restless. Unintentional weight loss:  Decreased PO intake over the last month, reflected in recent weight loss. Per family she will only take small bites of food. This is likely exacerbated by recurrent delirium. Behavior disturbance:   Hyperactive delirium. Per son, she has been having more issues with altered mental status over the last few months.       *Refer below for the patient's functional / cognitive assessments, and advance care planning*    Admit Date:  Functional Assessment   (DELETE THIS: refer to PT/OT notes)     ADLs:  IADLs:   Feeding: Requires Assistance  Dressing: Requires Assistance  Ambulation: Requires Assistance  Toileting: Requires Assistance  Bathing: Requires Assistance   Using the phone: Requires Assistance  Shopping: Dependent  Meal preparation: Dependent  Medication mgmt: Dependent  Managing finances: Dependent  Housework: Dependent  Transportation (driving or navigating public transit): Dependent     Living situation: Patient lives in skilled nursing with noone (lives alone).     Changes in ADLs during hospitalization: ***    Assistive devices: ***    Additional services recommended at discharge: ***    Cognitive Assessment     Delirium Assessment: CAM (Confusion Assessment Method):    On discharge, the patient {Delirium:51107}.      Other cognitive assessment:  The patient {MS exams:44480}. {MS exam interpretation:44481} (DELETE THIS: this may include the results of a mental status exam such as the Mini-Cog, MOCA, or SLUMS, or a narrative description of the patient's cognitive status)    Advance Care Planning       The patient has completed ACP paperwork, including: living will. Working on obtaining copy of living will at this time.    Code Status: DNR and DNI  Dr. Hubbard Robinson had extensive GOC conversation with patient's daughter in law, [name] and other family members who referenced a living will, which we do not have a copy of. Below is copy and pasted from an advanced care planning note from 3/23 with Dr. Juel Burrow and the daughter in law.     Obtained additional history from Dr. Bjorn Pippin daughter-in-law.  Appears that there has been a slow but steady decline over the past year. While Dr. Chandra Batch has been at home on her own, performing her own ADLs, she does have a housekeeper that comes a few times a week. Her housekeeper has been picking up additional tasks and helping out the patient increasingly over the past year.  Mentions that in July at her last CT surgery appointment at [facility], Dr. Chandra Batch requested to have a wheelchair which was entirely out of character.  Inetta Fermo mentions that at Thanksgiving, Dr. Chandra Batch did not recognize a family member - this person normally takes her to many of her doctors appointments.  There has been some suspicion that she was developing dementia, but the feelings are mixed within the family.  ??  Seeing Dr. Chandra Batch in this state has been especially distressing for the family.  Her hopes are that the patient will be back to a state where she can at least have a  conversation and ask what her wishes are. Bennie Hind states, when the time comes, that she hopes that Mariajose can go with dignity .  Says that she does know Dr. Chandra Batch would not want a feeding tube or resuscitative measures. Family was able to obtain living will which they say states she would not want any assisted living devices.  Family discussed and states that the patient would want to be DNR/DNI.     Health Care Decision Maker as of 05/17/2019             Surrogate decision maker:***    Emergency Contact:  Extended Emergency Contact Information  Primary Emergency Contact: Remer Macho  Mobile Phone: [phone]  Relation: Daughter in [place]  Preferred language: ENGLISH  Interpreter needed? No  Secondary Emergency Contact: FischerDickie La Mobile Phone: [phone]  Relation: Son

## 2019-05-17 NOTE — Unmapped (Signed)
Advance care planning    After discussion with Dr. Bjorn Pippin daughter-in-law Hayley Wagner, [name] has a living will that states she would not want any assisted living devices.  Family discussed and states that the patient would want to be DNR/DNI.  They will bring the living will in 3/24 to be included in her electronic medical record.    Hayley Wagner  Internal Medicine PGY-1

## 2019-05-17 NOTE — Unmapped (Signed)
Pt in with AMS.  VSS, c/o pain to right lip, lidocaine applied.  Turn q 2 hours.  Skin intact.  Son at bedside throughout shift, verbalized understanding of POC.  Will continue to work towards prioritized goals.    Problem: Adult Inpatient Plan of Care  Goal: Plan of Care Review  Outcome: Progressing     Problem: Skin Injury Risk Increased  Goal: Skin Health and Integrity  Outcome: Progressing     Problem: Fall Injury Risk  Goal: Absence of Fall and Fall-Related Injury  Outcome: Progressing

## 2019-05-17 NOTE — Unmapped (Signed)
Vancomycin Therapeutic Monitoring Pharmacy Note    Hayley Wagner is a 86 y.o. female starting vancomycin. Date of therapy initiation: 05/17/2019    Indication: Urinary Tract Infection (UTI)    Prior Dosing Information: 1000mg  IV x1 dose 05/17/2019     Goals:  Therapeutic Drug Levels  Vancomycin trough goal: 10-15 mg/L    Additional Clinical Monitoring/Outcomes  Renal function, volume status (intake and output)    Results: Not applicable    Wt Readings from Last 1 Encounters:   05/16/19 48.7 kg (107 lb 5.8 oz)     Creatinine   Date Value Ref Range Status   05/17/2019 0.95 0.60 - 1.00 mg/dL Final   16/11/9602 5.40 (H) 0.60 - 1.00 mg/dL Final   98/12/9145 8.29 (H) 0.60 - 1.00 mg/dL Final        Pharmacokinetic Considerations and Significant Drug Interactions:  ??? Adult (calculated on 05/17/2019): Vd = 34.577 L, ke = 0.041 hr-1  ??? Concurrent nephrotoxic meds: not applicable    Assessment/Plan:  Recommended Dose  ??? Start 750mg  IV Q24h  ??? Estimated trough on recommended regimen: 13.1 mg/L    Follow-up  ??? Level due: prior to fourth or fifth dose  ??? A pharmacist will continue to monitor and order levels as appropriate    Please page service pharmacist with questions/clarifications.    Beverely Risen, PharmD Candidate   Tiburcio Pea, PharmD, BCPS

## 2019-05-17 NOTE — Unmapped (Signed)
[facility]  Transition Note      Patient transitioned from [facility] to inpatient on 05/15/2019 due to altered mental status.     Patient has completed case management services through the [facility].    Care Coordination Note updates in [facility]: Yes

## 2019-05-17 NOTE — Unmapped (Signed)
Adult Nutrition Assessment Note    Visit Type: RN Consult  Reason for Visit: Have you gained or lost 10 pounds in the past 3 months?, Have you had a decrease in food intake or appetite?    ASSESSMENT:   HPI & PMH: Per EMR, Annasofia Pohl Yousuf is a 86 y.o. female with history of primary myelofibrosis, postherpetic neuralgia, and multiple recent admissions for AMS and delirium who was admitted from her SNF for worsening AMS.  Nutrition Hx: Of note, patient's Nutrition history was provided by Dickie La, patient's son. Per son, patient has been losing weight and having a low appetite for the past 6 weeks. Per son, during this time, his mother has only had bites of food or no food. Per EMR, patient with 17% weight loss in the last 3 months which is significant. Most of her symptoms occured after receiving the COVID vaccine doses. Son denies patient having N/V, but does endorse some diarrhea. Son reports his mother has some chewing issues.  Nutritionally Pertinent Meds: abx  Labs: Na 150, BUN 39  Abd/GI: Last BM 03/24  Skin: stage 2 pressure injury  Patient Lines/Drains/Airways Status    Active Wounds     Name:   Placement date:   Placement time:   Site:   Days:    Wound 05/15/19 Pressure Injury Buttocks Right Stage 2   05/15/19    2345    Buttocks   1               Current nutrition therapy order:   Nutrition Orders          Supplement Adult; Ensure High Protein (Standard High Protein); # of Products PER Serving: 1 3xd PC starting at 03/23 1300    Nutrition Therapy Regular/House starting at 03/22 2354           Anthropometric Data:  -- Height: 144.8 cm (4' 9)   -- Last recorded weight: 48.7 kg (107 lb 5.8 oz)  -- Admission weight: 48.7 kg (107 lb 5.8 oz)  -- IBW: 45.45 kg  -- Percent IBW: 107.15 %  -- BMI: Body mass index is 23.23 kg/m??.   -- Weight changes this admission:   Last 5 Recorded Weights    05/16/19 0300   Weight: 48.7 kg (107 lb 5.8 oz)      -- Weight history PTA:   Wt Readings from Last 10 Encounters: 05/16/19 48.7 kg (107 lb 5.8 oz)   05/09/19 50 kg (110 lb 5.4 oz)   04/22/19 51.3 kg (113 lb)   03/31/19 56.5 kg (124 lb 9.6 oz)   03/20/19 59 kg (130 lb 1.1 oz)   02/28/19 58.4 kg (128 lb 13.7 oz)   12/26/18 58.5 kg (128 lb 14.4 oz)   07/15/17 64 kg (141 lb)        Daily Estimated Nutrient Needs:  Energy: 1225-1470 kcals [25-30 kcal/kg using last recorded weight, 49 kg (05/17/19 1204)]  Protein: 59-74 gm [1.2-1.5 gm/kg using last recorded weight, 49 kg (05/17/19 1204)]  Carbohydrate:   [no restriction]  Fluid:   [per MD team]     Nutrition Focused Physical Exam:       Deferred, patient unable to participate, AMS                   DIAGNOSIS:  Malnutrition Assessment using AND/ASPEN Clinical Characteristics:    Severe Protein-Calorie Malnutrition in the context of acute illness or injury (05/17/19 1205)     Energy Intake: < or  equal to 50% of estimated energy requirement of > or equal to 5 days  Interpretation of Wt. Loss: > 7.5% x 3 months                 Overall nutrition impression:   Severe malnutrition identified. Patient would benefit from the use of nutrition supplements, will send Ensure HP and Supershakes. Monitor lytes for refeeding.      GOALS:  Oral Intake:       - Patient to consume >/=50% of 3 meals per day.  Laboratory Data:       - Electrolyte profile will be within provider-set parameters.     RECOMMENDATIONS AND INTERVENTIONS:  1. Continue regular diet order  2. Agree with Ensure HP TID, RD will also order Supershakes BID  3. Continue to monitor K, Phos, and Mg, replete aggressively PRN. Patient is at risk for refeeding syndrome given severe malnutrition.  4. Consider thiamine 250 mg x 5 days in the setting of refeeding  5. Consider anti-diarrheals  6. Patient with stage 2 pressure injuries, recommend MVI with minerals  7. Continue to monitor intake per % meal documentation in EMR  8. Please weigh patient weekly      RD Follow Up Parameters:  1-2 times per week (and more frequent as indicated) Abel Presto, MPH, RDN, LDN, CSCS  Pager: [phone]  Phone: [phone]

## 2019-05-17 NOTE — Unmapped (Signed)
Geriatrics (MEDA) Progress Note    Assessment & Plan:   Hayley Wagner is a 86 y.o. female with a PMHx of primary myelofibrosis, postherpetic neuralgia and multiple recent admissions for AMS that presented to [facility] from SNF with worsening AMS likely secondary to hyperactive delirium, improving.    Principal Problem:    Altered mental status  Active Problems:    Primary myelofibrosis (CMS-HCC)    AKI (acute kidney injury) (CMS-HCC)    Elevated troponin  Resolved Problems:    * No resolved hospital problems. *     Persistent delirium - multifactorial   Likely with some underlying vascular dementia as evidenced by prior imaging, which could explain her propensity for developing delirium. Treating for presumed UTI (see below). Patient is improved after initiation of antibiotics, in that she recognizes people in the room and is more interactive, but this morning on rounds she continues to be restless, oriented x2, and communicating that she is experiencing sharp pain at the side of her face. Overnight she did not sleep. Given her history of postherpetic neuralgia, will address neuropathic pain.  Overall, likely hyperactive delirium in the setting of ongoing infection, undertreated pain, poor sleep, and someone with likely underlying dementia.  - Continue treating UTI (see below)  - Management of postherpetic neuralgia (see below)  - Delirium precautions   - Melatonin 6 mg for insomnia  - Schedule pregabalin 50 mg nightly to help with postherpetic pain. May also help patient with insomina.    Urinary Tract Infection  UA positive for nitrates and leukocyte esterase. Urine culture pending unfortunately was after antibiotic administration. Denies abdominal pain or pain with urination, but delirious on exam.  -Ceftriaxone 1g q24 IV (3/22 -  )  - Switch to PO based on cultures as long as patient is amenable to taking PO.    Postherpetic neuralgia   On recent admission in February was found to have shingles on the right side of her face. Has remained on valacyclovir 500 mg since that time. Pain management has been an ongoing issue. Was started on gabapentin 100 mg TID and carbamazepine but both have subsequently been d/c'd due to concern for contributing to delirium. This morning, she is most fixated on the right facial pain and unable to sit still or get comfortable.  - Continue valacyclovir 500 mg daily  - Schedule tylenol 1000 mg TID  - Lidocaine cream to affected area TID  - Will try Duloxetine 20 mg daily to replace carbamazepine for long acting neuralgic pain control in combination with pregabalin 50 mg    Hypernatremia  Patient is likely dehydrated in the setting of decreased PO intake.  - Encourage free water intake.  - Strict I&Os    AKI - resolved  Cr. 1.59 on admission. Down to 0.95 today. She was given IV fluids on admission. Increased creatinine likely due to dehydration in the setting of delirium.     Daily Checklist:  Diet: Regular Diet  DVT PPx: Lovenox 30mg  q24h  Electrolytes: No Repletion Needed Potassium elevated on admission, but returned to normal this am  Code Status: DNR and DNI  Dispo: Patient is still acutely delirious and not safe for discharge at this time. Will continue to assess as mental status improves.    Subjective:   Patient remains restless with hyperactive delirium. She is complaining of right eye pain on exam this morning and unable to sit still during assesssment.    Objective:   Temp:  [35.7 ??C (96.3 ??  F)-36.1 ??C (97 ??F)] 35.9 ??C (96.6 ??F)  Heart Rate:  [81-95] 95  Resp:  [20-24] 22  BP: (115-138)/(61-76) 138/61  SpO2:  [95 %-99 %] 95 %    Gen: Ill appearing older woman. Alert and oriented to self only.   HEENT: atraumatic, sclera anicteric, MMM. OP w/o erythema or exudate.   Skin: Numerous crusted over/healed lesions over right side of face. No redness or swelling noted.    Heart: RRR, S1, S2, no M/R/G, no chest wall tenderness  Lungs: CTAB, no crackles or wheezes, no use of accessory muscles Abdomen: Normoactive bowel sounds, soft, NTND, no rebound/guarding  Extremities: no clubbing, cyanosis, or edema.   Psych: Restless on exam. Patient has difficulty concentrating and answering questions on exam.    Labs/Studies: Labs and Studies from the last 24hrs per EMR and Reviewed     Dominic Pea RN    I have verified all student documentation or findings. I have personally performed or re-performed the physical exam and medical decision making.  Hubbard Robinson  Internal Medicine PGY-1

## 2019-05-18 LAB — COMPREHENSIVE METABOLIC PANEL
ALBUMIN: 2.8 g/dL — ABNORMAL LOW (ref 3.5–5.0)
ALKALINE PHOSPHATASE: 86 U/L (ref 38–126)
ALT (SGPT): 44 U/L — ABNORMAL HIGH (ref <35)
ANION GAP: 10 mmol/L (ref 7–15)
BILIRUBIN TOTAL: 0.5 mg/dL (ref 0.0–1.2)
BLOOD UREA NITROGEN: 39 mg/dL — ABNORMAL HIGH (ref 7–21)
BUN / CREAT RATIO: 47
CALCIUM: 8.6 mg/dL (ref 8.5–10.2)
CO2: 25 mmol/L (ref 22.0–30.0)
EGFR CKD-EPI AA FEMALE: 73 mL/min/{1.73_m2} (ref >=60)
EGFR CKD-EPI NON-AA FEMALE: 63 mL/min/{1.73_m2} (ref >=60)
GLUCOSE RANDOM: 98 mg/dL (ref 70–179)
POTASSIUM: 3.7 mmol/L (ref 3.5–5.0)
PROTEIN TOTAL: 5.2 g/dL — ABNORMAL LOW (ref 6.5–8.3)
SODIUM: 152 mmol/L — ABNORMAL HIGH (ref 135–145)

## 2019-05-18 LAB — PHOSPHORUS: Phosphate:MCnc:Pt:Ser/Plas:Qn:: 2.9

## 2019-05-18 LAB — AST (SGOT): Aspartate aminotransferase:CCnc:Pt:Ser/Plas:Qn:: 46 — ABNORMAL HIGH

## 2019-05-18 LAB — SODIUM: Sodium:SCnc:Pt:Ser/Plas:Qn:: 142

## 2019-05-18 LAB — MAGNESIUM: Magnesium:MCnc:Pt:Ser/Plas:Qn:: 2

## 2019-05-18 NOTE — Unmapped (Signed)
Patient slept through out this tour. Bed remained in the lowest position, wheels locked and call bell placed within reach.  Problem: Adult Inpatient Plan of Care  Goal: Plan of Care Review  Outcome: Progressing  Goal: Patient-Specific Goal (Individualization)  Outcome: Progressing  Goal: Absence of Hospital-Acquired Illness or Injury  Outcome: Progressing  Goal: Optimal Comfort and Wellbeing  Outcome: Progressing  Goal: Readiness for Transition of Care  Outcome: Progressing  Goal: Rounds/Family Conference  Outcome: Progressing     Problem: Self-Care Deficit  Goal: Improved Ability to Complete Activities of Daily Living  Outcome: Progressing     Problem: Skin Injury Risk Increased  Goal: Skin Health and Integrity  Outcome: Progressing     Problem: Fall Injury Risk  Goal: Absence of Fall and Fall-Related Injury  Outcome: Progressing     Problem: Asthma Comorbidity  Goal: Maintenance of Asthma Control  Outcome: Progressing     Problem: COPD Comorbidity  Goal: Maintenance of COPD Symptom Control  Outcome: Progressing     Problem: Diabetes Comorbidity  Goal: Blood Glucose Level Within Desired Range  Outcome: Progressing     Problem: Heart Failure Comorbidity  Goal: Maintenance of Heart Failure Symptom Control  Outcome: Progressing     Problem: Hypertension Comorbidity  Goal: Blood Pressure in Desired Range  Outcome: Progressing     Problem: Obstructive Sleep Apnea Risk or Actual (Comorbidity Management)  Goal: Unobstructed Breathing During Sleep  Outcome: Progressing     Problem: Pain Chronic (Persistent) (Comorbidity Management)  Goal: Acceptable Pain Control and Functional Ability  Outcome: Progressing     Problem: Seizure Disorder Comorbidity  Goal: Maintenance of Seizure Control  Outcome: Progressing     Problem: Wound  Goal: Optimal Wound Healing  Outcome: Progressing

## 2019-05-18 NOTE — Unmapped (Signed)
Antibiotic Timeout Checklist  Indication for antibiotics: UTI  Antibiotic Start Date:05/15/19  Microbiology Results: Enterococcus faecium; pseudomonas  Sensitivities Available? pending  Are antibiotics still indicated? YES  Is it appropriate to de-escalate? NO  Is it appropriate to convert to PO therapy? NO  Today's antibiotic plan: No change  Planned Antibiotic Duration: I recommend to DC antibiotics tomorrow/ unsure if UTI versus colonization.

## 2019-05-18 NOTE — Unmapped (Signed)
Geriatrics (MEDA) Progress Note    I have verified all student documentation or findings. I have personally performed or re-performed the physical exam and medical decision making.  Hubbard Robinson  Internal Medicine PGY-1      Assessment & Plan:   Arhianna Ebey Goree is a 86 y.o. female with a PMHx of primary myelofibrosis, postherpetic neuralgia and multiple recent admissions for AMS that presented to [facility] from SNF with worsening AMS likely secondary to hyperactive delirium, improving.    Principal Problem:    Altered mental status  Active Problems:    Primary myelofibrosis (CMS-HCC)    AKI (acute kidney injury) (CMS-HCC)    Elevated troponin  Resolved Problems:    * No resolved hospital problems. *    Delirium - multifactorial  Patient appears closer to baseline this morning. She slept well overnight and is alert and interactive on rounds and markedly less restless. She is still making some confusing statements, but overall her mentation is improved. Her IV antibiotics were switched based on culture data (see below). She reports improvement in her face pain this morning as well. There are numerous reasons for her to be delirious, especially in the setting of a likely underlying vascular dementia. It is unclear which of these changes has had the biggest impact on her mental status improvement over the last 24 hours. As stated above, she is much closer to baseline, but continues to wax and wane.  - Continue UTI treatment (see below)  - Management of postherpetic neuralgia (see below)  - Delirium precautions  - Continue melatonin 6 mg for sleep    Urinary Tract Infection  UA positive for nitrates and leukocyte esterase. Urine culture grew Pseudomonas aeruginosa and Enterococcus faecium, but unfortunately collected was after antibiotic administration. Started on cefepime and vancomycin until sensitivities come back to inform decision for PO therapy. There is some concern that this culture growth reflects colonization and not an infection since the culture was collected after administration of antibiotics and lack of urinary symptoms. However, her improvement in delirium after switching her antibiotics to cover these organisms makes the case for this being an actual urinary tract infection.   - Initiated treatment for UTI with Ceftriaxone IV (3/22 - 3/24). Switched to Cefepime IV (3/24 -    ). Vancomycin IV (3/24 -   ) based on culture data.  - Follow up with sensitivities and switch to PO when appropriate    Postherpetic neuralgia   Patient denies facial pain on rounds this morning. Says it was improved overnight. She was able to sleep well and is grabbing her face less frequently.  - Continue valacyclovir 500 mg daily  - Schedule tylenol 1000 mg TID  - Lidocaine cream to affected area TID  - Duloxetine 20 mg daily and pregabalin 50 mg nightly    Hypernatremia  Na 152 on AM labs. Complaining of thirst this morning. She did not eat or drink very much over the last 24 hours per nursing and family report. Will start fluids to address free water deficit until patient can increase her free water and PO intake.  - MIVF D5 @ 154ml/hr  - Encourage free water intake  - Strict I&Os  -Repeat serum Na this afternoon.    Primary myelofibrosis: Diagnosed in 04/2018. Follows with Dr. [name] [facility] Hem/Onc). Initially treated with hydroxyurea but then transitioned to ruxolitinib in 12/2018. Ruxolitinib 10 mg twice daily??was continued while inpatient, although patient refused many doses.  - Holding ruxolitinib  Daily Checklist:  Diet: Regular Diet  DVT PPx: Lovenox 30mg  q24h  Electrolytes: No Repletion Needed  Code Status: DNR and DNI  Dispo: Patient is much closer to her baseline mental status. Will likely need to return to SNF when medically appropriate    Subjective:   Nursing staff reports patient slept well overnight. Patient states facial pain is improved, but still present. She reports that she is hungry and thirsty and eager to eat and drink.    Objective:   Temp:  [36.5 ??C (97.7 ??F)-36.7 ??C (98.1 ??F)] (P) 36.5 ??C (97.7 ??F)  Heart Rate:  [65-92] (P) 65  Resp:  [17-18] (P) 17  BP: (118-132)/(58-70) (P) 118/65  SpO2:  [92 %-98 %] (P) 98 %    Gen: Ill appearing older woman. Alert and oriented to self only.  HEENT: atraumatic, sclera anicteric, MMM. OP w/o erythema or exudate   Skin: Numerous crusted over/healed lesions over right side of face. No redness or swelling noted.    Heart: RRR, S1, S2, no M/R/G, no chest wall tenderness  Lungs: CTAB, no crackles or wheezes, no use of accessory muscles  Abdomen: Normoactive bowel sounds, soft, NTND, no rebound/guarding  Extremities: no clubbing, cyanosis, or edema  Psych: More interactive with staff and family member in room. Speech is at times garbled and thought processes are difficult to follow. She is less hyperactive, but still fidgeting some.    Labs/Studies: Labs and Studies from the last 24hrs per EMR and Reviewed     Paulla Fore, RN, NP student

## 2019-05-18 NOTE — Unmapped (Signed)
OCCUPATIONAL THERAPY  Evaluation (05/18/19 1403)    Patient Name:  Hayley Wagner       Medical Record Number: [id number]   Date of Birth: 1928/02/24  Sex: Female          OT Treatment Diagnosis:  Decreased ability to perform self-care tasks, here with delirium    Assessment    Problem List: Decreased strength, Decreased mobility, Impaired balance, Decreased coordination, Decreased cognition, Impaired judgement, Decreased safety awareness, Impaired ADLs, Fall Risk, Pain, Decreased knowledge of self-care    Assessment: Pt presents to [facility] here as a 86 y.o. female with a PMHx of primary myelofibrosis, postherpetic neuralgia and multiple recent admissions for AMS that presented to [facility] from SNF with worsening AMS likely secondary to hyperactive delirium, improving per MD note.     Pt presents today pleasant, conversing, following commands >90% of time, and oriented to place and her birth date & age. Pt is still presenting with facial pain that randomly occurs, and also presents with decreased ability to perform functional mobility (used RW and physical assistance to mange RW plus verbal cues), all impacting her ability to perform self-care tasks. Pt still has poor recall of recent events, decreased awareness of assistance needed, and had some disorientation to time.     Prior to February 2021, pt's son reports she was independent, living alone, but February on has required assistance with ADL's, going in and out of hospital and rehab due to infections and side effects from COVID-19 vaccine.     Pt benefits from skilled OT services in order to maximize ADL's and ensure safety with all transfers/tasks.     Today's Interventions: OT role and POC, ADL engagement for simple grooming tasks, LB dressing tasks; functional transfers/mobility- safety with RW (trial pediatric RW next session due to her height)  AMPAC: 15/24    Activity Tolerance During Today's Session  Patient tolerated treatment well    Plan  Planned Frequency of Treatment:  1-2x per day for: 2-3x week       Planned Interventions:  Adaptive equipment, Balance activities, ADL retraining, Bed mobility, Compensatory tech. training, Conservation, Education - Patient, Home exercise program, Functional mobility, Endurance activities, Education - Family / caregiver, Neuromuscular re-education, Insurance account manager / Proximal stability, Positioning, Range of motion, Safety education, Visual / perceptual tasks, UE Strength / coordination exercise, Transfer training, Therapeutic exercise, Functional cognition    Post-Discharge Occupational Therapy Recommendations:  OT Post Acute Discharge Recommendations: 5x weekly, Low intensity   OT DME Recommendations: Defer to post acute    GOALS:   Patient and Family Goals: (will follow-up next session)    Long Term Goal #1: Performed self care routine with S within 6 weeks       Short Term:  pt will perform BSC t/f, hygiene, and clothing management, with supervision with LRAD   Time Frame : 2 weeks  pt will perform simple grooming task, standing or sitting at sink, with supervision   Time Frame : 2 weeks  pt will perform full body dressing with supervision   Time Frame : 2 weeks    Prognosis:  Fair  Positive Indicators:  support from her son  Barriers to Discharge: Cognitive deficits, Impaired Balance, Inability to safely perform ADLS, Functional strength deficits, Decreased safety awareness, Severity of deficits, Impulsivity, Gait instability, Pain    Subjective  Current Status received in recliner, left in recliner, alarm on, RN Alvy Beal present  Prior Functional Status for the past 6 weeks -  has needed assistance with all ADL's (has been in and out of hospital since February due to infections and issues after COVID-19 vaccine), prior to January- was independent with ADL's and living alone    Medical Tests / Procedures: Reviewed in [facility]  Services patient receives: OT, PT    Patient / Caregiver reports: I am going to go home and get a picture of Snickers for you!- son stating about her cat.    Past Medical History:   Diagnosis Date   ??? Anemia    ??? At risk for falls    ??? CAD (coronary artery disease)    ??? Cancer (CMS-HCC)    ??? Chronic kidney disease    ??? GERD (gastroesophageal reflux disease)    ??? Hyperlipidemia    ??? Hypertension     Social History     Tobacco Use   ??? Smoking status: Former Smoker   ??? Smokeless tobacco: Never Used   Substance Use Topics   ??? Alcohol use: Yes      No past surgical history on file. History reviewed. No pertinent family history.     Patient has no known allergies.     Objective Findings  Precautions / Restrictions  Falls precautions    Weight Bearing  Non-applicable    Required Braces or Orthoses  Non-applicable    Communication Preference  Verbal    Pain  reports intermittent pain, grabbing her face, reports due to shingles    Equipment / Environment  Caregiver wearing mask for full session, Patient not wearing mask for full session, Vascular access (PIV, TLC, Port-a-cath, PICC)    Living Situation  Living Environment: House  Lives With: Son(Son plans to stay with her)  Home Living: Walk-in shower     Cognition   Orientation Level:  Disoriented to time(oriented to place)   Arousal/Alertness:  Appropriate responses to stimuli   Attention Span:  Attends with cues to redirect   Memory:  Decreased recall of recent events   Following Commands:  Follows one step commands with repetition   Safety Judgment:  Decreased awareness of need for assistance   Awareness of Errors:  Decreased awareness of need for assistance   Problem Solving:  Assistance required to identify errors made    Vision / Perception    Hearing: [facility]   Vision: Wears glasses all the time(Son is bringing glasses from home today (left mid-session to go get them))  Perception: grossly intact    Hand Function  Hand Dominance: right handed  [facility]    Skin Inspection  grossly intact per visible skin    ROM / Strength/Coordination  UE ROM/ Strength/ Coordination: UE ROM and strength WFL  LE ROM/ Strength/ Coordination: age appropriate strength and ROM in both legs    Sensation:  grossly intact to light touch in both feet    Balance:  CGA for standing balance with RW (recommend pediatric RW), sitting edge of recliner - supervision    Functional Mobility  Transfer Assistance Needed: Yes  Transfers - Needs Assistance: Min assist to stand up, both HHA and with RW; 4 steps using RW Min A overall, max verbal cues for safety   Bed Mobility Assistance Needed: (Not tested)    ADLs  ADLs: Needs assistance with ADLs, Supervision  Feeding - Needs Assistance: Set Up Assist(received eating upon arrival)  Grooming - Needs Assistance: Set Up Assist, mod verbal cues for covering all areas of her face  LB Dressing - Needs Assistance: mod A for  donning socks, initially starting the task and assisting with pulling up for better fit, able to demo figure four position in recliner to assist      Vitals / Orthostatics  With Activity: 87% Sp02 on RA when spot checked, improving to >90% Sp02 on RA with time      Medical Staff Made Aware: Marguerite Olea      Occupational Therapy Session Duration  OT Individual - Duration: 40         I attest that I have reviewed the above information.  Signed: Mujtaba Bollig Frances Maywood, OT  Filed 05/18/2019

## 2019-05-19 LAB — COMPREHENSIVE METABOLIC PANEL
ALBUMIN: 3 g/dL — ABNORMAL LOW (ref 3.5–5.0)
ALKALINE PHOSPHATASE: 82 U/L (ref 38–126)
ALT (SGPT): 43 U/L — ABNORMAL HIGH (ref <35)
AST (SGOT): 57 U/L — ABNORMAL HIGH (ref 14–38)
BILIRUBIN TOTAL: 0.8 mg/dL (ref 0.0–1.2)
BLOOD UREA NITROGEN: 38 mg/dL — ABNORMAL HIGH (ref 7–21)
BUN / CREAT RATIO: 48
CALCIUM: 8.6 mg/dL (ref 8.5–10.2)
CHLORIDE: 114 mmol/L — ABNORMAL HIGH (ref 98–107)
CO2: 21 mmol/L — ABNORMAL LOW (ref 22.0–30.0)
CREATININE: 0.79 mg/dL (ref 0.60–1.00)
EGFR CKD-EPI AA FEMALE: 77 mL/min/{1.73_m2} (ref >=60)
EGFR CKD-EPI NON-AA FEMALE: 67 mL/min/{1.73_m2} (ref >=60)
GLUCOSE RANDOM: 81 mg/dL (ref 70–179)
POTASSIUM: 4.1 mmol/L (ref 3.5–5.0)
PROTEIN TOTAL: 5.5 g/dL — ABNORMAL LOW (ref 6.5–8.3)
SODIUM: 145 mmol/L (ref 135–145)

## 2019-05-19 LAB — MAGNESIUM: Magnesium:MCnc:Pt:Ser/Plas:Qn:: 1.9

## 2019-05-19 LAB — CBC
HEMOGLOBIN: 8.5 g/dL — ABNORMAL LOW (ref 12.0–15.5)
MEAN CORPUSCULAR HEMOGLOBIN: 34.9 pg — ABNORMAL HIGH (ref 26.0–34.0)
MEAN CORPUSCULAR VOLUME: 108.3 fL — ABNORMAL HIGH (ref 82.0–98.0)
MEAN PLATELET VOLUME: 9.9 fL (ref 7.0–10.0)
NUCLEATED RED BLOOD CELLS: 4 /100{WBCs} (ref <=4)
PLATELET COUNT: 287 10*9/L (ref 150–450)
RED BLOOD CELL COUNT: 2.45 10*12/L — ABNORMAL LOW (ref 3.90–5.03)
RED CELL DISTRIBUTION WIDTH: 30.1 % — ABNORMAL HIGH (ref 12.0–15.0)
WBC ADJUSTED: 7.9 10*9/L (ref 3.5–10.5)

## 2019-05-19 LAB — PHOSPHORUS: Phosphate:MCnc:Pt:Ser/Plas:Qn:: 2.7 — ABNORMAL LOW

## 2019-05-19 LAB — SODIUM: Sodium:SCnc:Pt:Ser/Plas:Qn:: 145

## 2019-05-19 LAB — CHLORIDE: Chloride:SCnc:Pt:Ser/Plas:Qn:: 114 — ABNORMAL HIGH

## 2019-05-19 LAB — MEAN CORPUSCULAR HEMOGLOBIN CONC: Erythrocyte mean corpuscular hemoglobin concentration:MCnc:Pt:RBC:Qn:Automated count: 32.2

## 2019-05-19 NOTE — Unmapped (Signed)
Patient sleeping most of this tour. Arousing easily to verbal stimuli. No signs of discomfort noted. No moaning out or facial grimacing noted. Bed remains in the lowest position, wheels locked and call bell placed within reach.  Problem: Adult Inpatient Plan of Care  Goal: Plan of Care Review  Outcome: Progressing  Goal: Patient-Specific Goal (Individualization)  Outcome: Progressing  Goal: Absence of Hospital-Acquired Illness or Injury  Outcome: Progressing  Goal: Optimal Comfort and Wellbeing  Outcome: Progressing  Goal: Readiness for Transition of Care  Outcome: Progressing  Goal: Rounds/Family Conference  Outcome: Progressing     Problem: Self-Care Deficit  Goal: Improved Ability to Complete Activities of Daily Living  Outcome: Progressing     Problem: Skin Injury Risk Increased  Goal: Skin Health and Integrity  Outcome: Progressing     Problem: Fall Injury Risk  Goal: Absence of Fall and Fall-Related Injury  Outcome: Progressing     Problem: Asthma Comorbidity  Goal: Maintenance of Asthma Control  Outcome: Progressing     Problem: COPD Comorbidity  Goal: Maintenance of COPD Symptom Control  Outcome: Progressing     Problem: Diabetes Comorbidity  Goal: Blood Glucose Level Within Desired Range  Outcome: Progressing     Problem: Heart Failure Comorbidity  Goal: Maintenance of Heart Failure Symptom Control  Outcome: Progressing     Problem: Hypertension Comorbidity  Goal: Blood Pressure in Desired Range  Outcome: Progressing     Problem: Obstructive Sleep Apnea Risk or Actual (Comorbidity Management)  Goal: Unobstructed Breathing During Sleep  Outcome: Progressing     Problem: Pain Chronic (Persistent) (Comorbidity Management)  Goal: Acceptable Pain Control and Functional Ability  Outcome: Progressing     Problem: Seizure Disorder Comorbidity  Goal: Maintenance of Seizure Control  Outcome: Progressing     Problem: Wound  Goal: Optimal Wound Healing  Outcome: Progressing

## 2019-05-19 NOTE — Unmapped (Signed)
Geriatrics (MEDA) Progress Note    Assessment & Plan:   Hayley Wagner is a 86 y.o. female with a PMHx of primary myelofibrosis, postherpetic neuralgia and multiple recent admissions for AMS that presented to [facility] from SNF with worsening AMS likely secondary to hyperactive delirium, improving.    Principal Problem:    Altered mental status  Active Problems:    Primary myelofibrosis (CMS-HCC)    AKI (acute kidney injury) (CMS-HCC)    Elevated troponin  Resolved Problems:    * No resolved hospital problems. *    Delirium - multifactorial, likely secondary to some underlying dementia, whether vascular given precipitous decline.  May have some contribution from urinary tract infection, insomnia (which is a documented side effect of ruxolitinib), pain secondary to her postherpetic neuralgia, or centrally acting medications such as Ativan prior to admission.  Delirium appears stable over the past couple of days, with continuing waxing and waning mental status.   - Continue UTI treatment (see below)  - Management of postherpetic neuralgia (see below)  - Delirium precautions  - Continue melatonin 6 mg for sleep    Urinary Tract Infection, cultures growing Pseudomonas and Enterococcus faecium.  Was treated with ceftriaxone previously, switched to cefepime/vancomycin to complete 3-day course on 3/26 for acute uncomplicated cystitis.  - Cefepime/vancomycin (3/24 -3/26).    Fall, found on the floor by phlebotomy this AM.  On evaluation, neuro exam nonfocal, her baseline mildly delirious mental status and not complaining of any pain.  Do not feel that additional studies are necessary at this time given reassuring neuro exam.    Postherpetic neuralgia   Patient denies facial pain on rounds this morning. Says it was improved overnight. She was able to sleep well and is grabbing her face less frequently.  - Continue valacyclovir 500 mg daily  - Schedule tylenol 1000 mg TID  - Lidocaine cream to affected area TID  - Duloxetine 20 mg daily and pregabalin 50 mg nightly    Hypernatremia, resolved  - Encourage p.o. intake  - Strict I&Os    Primary myelofibrosis: Diagnosed in 04/2018. Follows with Dr. [name] [facility] Hem/Onc). Initially treated with hydroxyurea but then transitioned to ruxolitinib in 12/2018. Ruxolitinib 10 mg twice daily??was continued while inpatient, although patient refused many doses.  - Holding ruxolitinib    Daily Checklist:  Diet: Regular Diet  DVT PPx: Lovenox 30mg  q24h  Electrolytes: No Repletion Needed  Code Status: DNR and DNI  Dispo: Med A, floor    Subjective:   Continues to sleep well overnight.  Was found down on the ground by phlebotomy this morning.  Query unwitnessed fall, denied any headache, pain.  Answering some questions appropriately.  Relatively thirsty this a.m. drank 1 cup of water with some encouragement.    Objective:   Temp:  [36.4 ??C] 36.4 ??C  Heart Rate:  [54-60] 57  Resp:  [17] 17  BP: (104-115)/(45-68) 115/68  SpO2:  [94 %-99 %] 99 %    Gen: Ill appearing older woman. Alert and oriented to self only.  HEENT: atraumatic, sclera anicteric, MMM. OP w/o erythema or exudate   Skin: Numerous crusted over/healed lesions over right side of face. No redness or swelling noted.    Heart: RRR, S1, S2, no M/R/G, no chest wall tenderness  Lungs: CTAB, no crackles or wheezes, no use of accessory muscles  Abdomen: Normoactive bowel sounds, soft, NTND, no rebound/guarding  Extremities: no clubbing, cyanosis, or edema  Psych: Speech is at times garbled and thought  processes are difficult to follow. She is less hyperactive    Labs/Studies: Labs and Studies from the last 24hrs per EMR and Reviewed     Hubbard Robinson  Internal Medicine PGY-1

## 2019-05-19 NOTE — Unmapped (Addendum)
Pt found down on her knees, leaning on the bed by phlebotomy. Pt assisted back into bed, VSS, no signs of injury. Pt c/o headache but no signs of injury. MD notified, VSS. MD in room to assess.

## 2019-05-20 LAB — COMPREHENSIVE METABOLIC PANEL
ALBUMIN: 2.4 g/dL — ABNORMAL LOW (ref 3.5–5.0)
ALKALINE PHOSPHATASE: 68 U/L (ref 38–126)
ALT (SGPT): 38 U/L — ABNORMAL HIGH (ref <35)
ANION GAP: 6 mmol/L — ABNORMAL LOW (ref 7–15)
AST (SGOT): 46 U/L — ABNORMAL HIGH (ref 14–38)
BILIRUBIN TOTAL: 0.5 mg/dL (ref 0.0–1.2)
CALCIUM: 8.2 mg/dL — ABNORMAL LOW (ref 8.5–10.2)
CHLORIDE: 111 mmol/L — ABNORMAL HIGH (ref 98–107)
CO2: 25 mmol/L (ref 22.0–30.0)
CREATININE: 0.71 mg/dL (ref 0.60–1.00)
EGFR CKD-EPI AA FEMALE: 88 mL/min/{1.73_m2} (ref >=60)
EGFR CKD-EPI NON-AA FEMALE: 76 mL/min/{1.73_m2} (ref >=60)
GLUCOSE RANDOM: 78 mg/dL (ref 70–179)
POTASSIUM: 3.8 mmol/L (ref 3.5–5.0)
PROTEIN TOTAL: 4.7 g/dL — ABNORMAL LOW (ref 6.5–8.3)

## 2019-05-20 LAB — ANION GAP: Anion gap 3:SCnc:Pt:Ser/Plas:Qn:: 6 — ABNORMAL LOW

## 2019-05-20 LAB — PHOSPHORUS: Phosphate:MCnc:Pt:Ser/Plas:Qn:: 2.9

## 2019-05-20 LAB — MAGNESIUM: Magnesium:MCnc:Pt:Ser/Plas:Qn:: 1.8

## 2019-05-20 MED ORDER — ONDANSETRON 4 MG DISINTEGRATING TABLET
Freq: Three times a day (TID) | ORAL | 0 refills | 0.00000 days | PRN
Start: 2019-05-20 — End: 2019-05-27

## 2019-05-20 MED ORDER — SENNOSIDES 8.6 MG TABLET
Freq: Every evening | ORAL | 0 refills | 0.00000 days | PRN
Start: 2019-05-20 — End: 2019-06-19

## 2019-05-20 MED ORDER — PREGABALIN 50 MG CAPSULE
Freq: Every evening | ORAL | 0 refills | 0.00000 days
Start: 2019-05-20 — End: ?

## 2019-05-20 NOTE — Unmapped (Signed)
[facility]iatrics Discharge Summary     Identifying Information:   Hayley Wagner  [birth year]  [id number]    PCP: Caryl Asp, FNP    Admit date: 05/15/2019     Discharge date: 05/20/2019     Discharge Attending Physician: Sonny Masters, MD; (P): [phone]    Discharge to: Skilled [facility] Facility: [facility]; T: [phone]; F: [phone]     Discharge Diagnoses:  Principal Problem:    Altered mental status  Active Problems:    Primary myelofibrosis (CMS-HCC)    AKI (acute kidney injury) (CMS-HCC)    Elevated troponin  Resolved Problems:    * No resolved hospital problems. [facility] Course     PCP Post-Discharge Follow Up Issues:   [ ]  Likely that clonazepam contributed to worsening delirium.  Would avoid benzodiazepines in this patient  [ ]  Held oxycodone, carbamazepine at discharge.  Started on pregabalin and duloxetine for postherpetic neuralgia  [ ]  If patient has worsening delirium, agitation at night, recommend increasing pregabalin dosing   [ ]  Ruxolitinib held at discharge, documented 12% insomnia ADR, was sleeping poorly at SNF and concerned that this may have also contributed to delirium. Sleep improved off of the medication   To contact the discharging attending physician with regard to the patient's hospitalization or follow-up needs, please call: [facility] Division of Geriatrics [phone]).     Hayley Wagner is a 86 y.o. female with a PMHx of primary myelofibrosis, postherpetic neuralgia and multiple recent admissions for AMS that presented to [facility] from SNF with worsening AMS, found to have hyperactive delirium.  ??  Persistent delirium - multifactorial   On admission, patient was hyperactive and disoriented, consistent with hyperactive delirium. Centrally acting medications (carbamazepime, clonazepam, oxycodone) were held on admission. UA indicative of infection, see below which was treated with IV antibiotics (see below).  Was reported that at SNF patient had not been sleeping at night, so Ruxolitinib was held as well due to possible documented side effect of insomnia.  She was started on pregabalin and Cymbalta for her postherpetic neuralgia.  Her mental status improved to an easily redirectable, waxing and waning delirium.  Likely etiology some underlying dementia, as additional history obtained from family indicative of a ~1 year decline in functional status and cognition.  In addition, additional insults with uncontrolled pain from her postherpetic neuralgia, insomnia, centrally acting medications, and possible UTI contributing to cause her hyperactive delirium from admission. High suspicion that benzodiazepines contributed to worsening delirium.    Acute uncomplicated cystitis, UA obtained on admission significant for infection, was started on IV ceftriaxone.  Urine culture grew Pseudomonas and Enterococcus faecium, was transitioned to vancomycin/cefepime to complete a 3-day course on 3/26.    Postherpetic neuralgia, initially held carbamazepine and oxycodone on admission due to hyperactive delirium.  Trialed Cymbalta 20 mg and pregabalin 50 mg nightly with dramatic improvement in her pain and ability to sleep.  Maintained on valacyclovir, and topical lidocaine cream to the affected area.    Hypernatremia, peaked at 152, secondary to poor p.o. intake in the setting of her hyperactive delirium.  Received some D5, with improvement in her sodium.  As her delirium improved, she was able to take more p.o. and sodium stayed stable.    Primary myelofibrosis??  Diagnosed in 04/2018. Follows with Dr. [name] [facility] Heme/Onc). She was initially treated with hydroxurea, but transitioned ruxolitinib in 12/2018. This was held on admission due to concern for  this medication contributing to her insomnia.  On discharge ruxolitinib was held.      **GERIATRIC ASSESSMENT:     Patient has had a significant decline in her mental status over the last 2-3 months. She has had two recent hospitalizations for delirium. In talking to [name], one of the patient's sons, he says he has noticed a steady decline in mentation over the last year. He feels that she has been able to conceal these deficits from family, as she was relatively independent and functional prior to her first hospitalization in early February. She was living alone at that time with a caregiver coming a few times a week. Since these recent hospitalizations, she has been living at a skilled nursing facility, and her son feels that she really has not returned to baseline. She has had some level of delirium or altered mental status since then. 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       Geriatric ROS  Vision impairment:   yes Hearing impairment:   none Swallowing impairment:  Difficult to assess. Patient reports some mouth pain when asked, but unclear if she understands the question. Swallowing thin liquids without difficulty or signs of pain at this time.   Urinary incontinence:  Yes Fecal incontinence:   yes Constipation:   Abdominal xray on admission showed medium stool burden.    Falls/Gait:    PHQ 2 or 9  Unable to assess. Patient is delirious on exam    Sleep disturbance:   Patient has not been sleeping well over the last few weeks and has been overall very restless. Unintentional weight loss:  Decreased PO intake over the last month, reflected in recent weight loss. Per family she will only take small bites of food. This is likely exacerbated by recurrent delirium. Behavior disturbance:   Hyperactive delirium. Per son, she has been having more issues with altered mental status over the last few months.       *Refer below for the patient's functional / cognitive assessments, and advance care planning*    Admit Date:  Functional Assessment   (DELETE THIS: refer to PT/OT notes)     ADLs:  IADLs:   Feeding: Requires Assistance  Dressing: Requires Assistance Ambulation: Requires Assistance  Toileting: Requires Assistance  Bathing: Requires Assistance   Using the phone: Requires Assistance  Shopping: Dependent  Meal preparation: Dependent  Medication mgmt: Dependent  Managing finances: Dependent  Housework: Dependent  Transportation (driving or navigating public transit): Dependent     Living situation: Patient lives in skilled nursing with noone (lives alone).     Advance Care Planning       The patient has completed ACP paperwork, including: living will. Working on obtaining copy of living will at this time.    Code Status: DNR and DNI  Dr. Hubbard Robinson had extensive GOC conversation with patient's daughter in law, [name] and other family members who referenced a living will, which we do not have a copy of. Below is copy and pasted from an advanced care planning note from 3/23 with Dr. Juel Burrow and the daughter in law.     Obtained additional history from Dr. Bjorn Pippin daughter-in-law.  Appears that there has been a slow but steady decline over the past year. While Dr. Chandra Batch has been at home on her own, performing her own ADLs, she does have a housekeeper that comes a few times a week. Her  housekeeper has been picking up additional tasks and helping out the patient increasingly over the past year.  Mentions that in July at her last CT surgery appointment at [facility], Dr. Chandra Batch requested to have a wheelchair which was entirely out of character.  Inetta Fermo mentions that at Thanksgiving, Dr. Chandra Batch did not recognize a family member - this person normally takes her to many of her doctors appointments.  There has been some suspicion that she was developing dementia, but the feelings are mixed within the family.  ??  Seeing Dr. Chandra Batch in this state has been especially distressing for the family.  Her hopes are that the patient will be back to a state where she can at least have a conversation and ask what her wishes are. Bennie Hind states, when the time comes, that she hopes that Maisie can go with dignity .  Says that she does know Dr. Chandra Batch would not want a feeding tube or resuscitative measures. Family was able to obtain living will which they say states she would not want any assisted living devices.  Family discussed and states that the patient would want to be DNR/DNI.       Emergency Contact:  Extended Emergency Contact Information  Primary Emergency Contact: Remer Macho  Mobile Phone: [phone]  Relation: Daughter in [place]  Preferred language: ENGLISH  Interpreter needed? No  Secondary Emergency Contact: FischerDickie La  Mobile Phone: [phone]  Relation: Son      Procedures   None  No admission procedures for hospital encounter.    Discharge Day Services:   BP 109/55  - Pulse 59  - Temp 36.4 ??C (Oral)  - Resp 18  - Ht 144.8 cm (4' 9)  - Wt 48.7 kg (107 lb 5.8 oz)  - SpO2 97%  - BMI 23.23 kg/m??    Last 5 Recorded Weights    05/16/19 0300   Weight: 48.7 kg (107 lb 5.8 oz)       Pt seen on the day of discharge and determined appropriate for discharge.    Condition at Discharge: stable    Length of Discharge: I spent greater than 30 mins in the discharge of this patient.    Discharge Medication List:        Your Medication List      STOP taking these medications    bacitracin 500 unit/gram ointment     carBAMazepine 200 mg tablet  Commonly known as: TEGretol     clonazePAM 0.125 MG disintegrating tablet  Commonly known as: KlonoPIN     JAKAFI 5 mg tablet  Generic drug: ruxolitinib     metroNIDAZOLE 1 % gel  Commonly known as: METROGEL     mupirocin 2 % ointment  Commonly known as: BACTROBAN     oxyCODONE 10 mg immediate release tablet  Commonly known as: ROXICODONE     ruxolitinib 5 mg tablet  Commonly known as: JAKAFI     senna-docusate 8.6-50 mg  Commonly known as: PERICOLACE        START taking these medications    DULoxetine 20 MG capsule  Commonly known as: CYMBALTA  Take 1 capsule (20 mg total) by mouth daily.  Start taking on: May 21, 2019     ondansetron 4 MG disintegrating tablet  Commonly known as: ZOFRAN-ODT  Take 1 tablet (4 mg total) by mouth every eight (8) hours as needed for up to 7 days.     pregabalin 50 MG capsule  Commonly known as: LYRICA  Take  1 capsule (50 mg total) by mouth nightly.     senna 8.6 mg tablet  Commonly known as: SENOKOT  Take 2 tablets by mouth nightly as needed for constipation.        CONTINUE taking these medications    acetaminophen 500 MG tablet  Commonly known as: TYLENOL  Take 1,000 mg by mouth every eight (8) hours.     aluminum-magnesium hydroxide-simethicone 400-400-40 mg/5 mL suspension  Commonly known as: MAALOX MAX  Take 30 mL by mouth every six (6) hours as needed for indigestion.     aspirin 81 MG chewable tablet  Chew 81 mg daily.     betamethasone valerate 0.1 % lotion  Commonly known as: VALISONE  Apply 1 application topically Two (2) times a day.     carboxymethylcellulose sodium 0.25 % Drop  Commonly known as: THERATEARS  Administer 1 drop to both eyes Six (6) times a day.     cholecalciferol (vitamin D3-50 mcg (2,000 unit)) 50 mcg (2,000 unit) tablet  Take 1 tablet by mouth daily.     lidocaine 2% viscous 2 % Soln  Commonly known as: XYLOCAINE  15 mL by Mouth route every four (4) hours as needed (for mouth pain prior to eating).     lidocaine 4 % cream  Commonly known as: LMX  Apply topically Three (3) times a day as needed (Apply to face for face pain).     melatonin 3 mg Tab  Take 2 tablets (6 mg total) by mouth every evening. At 6pm     multivitamin per tablet  Commonly known as: TAB-A-VITE/THERAGRAN  Take 1 tablet by mouth daily.     polyethylene glycol 17 gram packet  Commonly known as: MIRALAX  Take 17 g by mouth daily.     RESTASIS MULTIDOSE 0.05 % Drop  Generic drug: cycloSPORINE  Administer 1 drop to both eyes nightly as needed.     simvastatin 20 MG tablet  Commonly known as: ZOCOR  TAKE 1 TABLET BY MOUTH ONCE DAILY     valACYclovir 500 MG tablet  Commonly known as: VALTREX  Take 1 tablet (500 mg total) by mouth daily.     vitamin C 250 MG tablet  Generic drug: ascorbic acid (vitamin C)  Take 250 mg by mouth daily.               Pending Test Results (if blank, then none):  Pending Labs     Order Current Status    Blood Culture #1 Preliminary result    Blood Culture #2 Preliminary result    Urine Culture Preliminary result          Most Recent Labs:  Microbiology Results (last day)     Procedure Component Value Date/Time Date/Time    COVID-19 PCR [[id number]]  (Normal) Collected: 05/19/19 1247    Lab Status: Final result Specimen: Nasopharyngeal Swab Updated: 05/19/19 2004     SARS-CoV-2 PCR Not Detected    Narrative:      This test was performed using the Abbott Alinity m SARS-CoV-2 assay which has been validated by the CLIA-certified, CAP-inspected [facility] Clinical Molecular Microbiology [facility]. FDA has granted Emergency Use Authorization for this test. This real-time RT-PCR test detects SARS-CoV-2 by targeting the N and RdRp genes. Negative results do not preclude SARS-CoV-2 infection and should not be used as the sole basis for patient management decisions. Negative results must be combined with clinical observations, patient history, and epidemiological information.  Information for providers and patients can be found here: [email or web address]        Blood Culture #1 [[id number]] Collected: 05/15/19 1802    Lab Status: Preliminary result Specimen: Blood from 1 Peripheral Draw Updated: 05/19/19 1815     Blood Culture, Routine No Growth at 4 days    Blood Culture #2 [[id number]] Collected: 05/15/19 1802    Lab Status: Preliminary result Specimen: Blood from 1 Peripheral Draw Updated: 05/19/19 1815     Blood Culture, Routine No Growth at 4 days    Urine Culture [[id number]]  (Abnormal)  (Susceptibility) Collected: 05/16/19 1323    Lab Status: Preliminary result Specimen: Urine from Catheterized-In and Out Catheter Updated: 05/19/19 1630     Urine Culture, Comprehensive 50,000 to 100,000 CFU/mL Pseudomonas aeruginosa      50,000 to 100,000 CFU/mL Enterococcus faecium     Comment: Susceptibility Results to follow       Narrative:      Specimen Source: Catheterized-In and Out Catheter    Susceptibility     Pseudomonas aeruginosa (1)     Antibiotic Interpretation Microscan Method Status    Ceftazidime Susceptible  KIRBY BAUER Preliminary    Ciprofloxacin Susceptible  KIRBY BAUER Preliminary    Gentamicin Susceptible  KIRBY BAUER Preliminary    Levofloxacin Intermediate  KIRBY BAUER Preliminary    Piperacillin + Tazobactam Susceptible  KIRBY BAUER Preliminary    Tobramycin Susceptible  KIRBY BAUER Preliminary          Enterococcus faecium (3)     Antibiotic Interpretation Microscan Method Status    Ampicillin Susceptible  KIRBY BAUER Preliminary     For Enterococcus spp., cephalosporins, aminoglycosides, clindamycin and trimethoprim sulfamethoxazole may appear active in vitro but are not effective clinically and are not tested in this laboratory.    Nitrofurantoin Intermediate  KIRBY BAUER Preliminary    Doxycycline Resistant  KIRBY BAUER Preliminary                         Lab Results   Component Value Date    WBC 7.9 05/19/2019    HGB 8.5 (L) 05/19/2019    HCT 26.5 (L) 05/19/2019    PLT 287 05/19/2019       Lab Results   Component Value Date    NA 142 05/20/2019    K 3.8 05/20/2019    CL 111 (H) 05/20/2019    CO2 25.0 05/20/2019    BUN 36 (H) 05/20/2019    CREATININE 0.71 05/20/2019    CALCIUM 8.2 (L) 05/20/2019    MG 1.8 05/20/2019    PHOS 2.9 05/20/2019       Lab Results   Component Value Date    ALKPHOS 68 05/20/2019    BILITOT 0.5 05/20/2019    BILIDIR 0.30 05/16/2019    PROT 4.7 (L) 05/20/2019    ALBUMIN 2.4 (L) 05/20/2019    ALT 38 (H) 05/20/2019    AST 46 (H) 05/20/2019       Lab Results   Component Value Date    PT 13.4 04/26/2019    INR 1.13 04/26/2019     Hospital Radiology:  Ecg 12 Lead    Result Date: 05/15/2019  SINUS RHYTHM WITH OCCASIONAL PREMATURE VENTRICULAR BEATS LOW VOLTAGE QRS CANNOT RULE OUT ANTERIOR INFARCT  , AGE UNDETERMINED ABNORMAL ECG WHEN COMPARED WITH ECG OF 20-Apr-2019 19:18, PREMATURE VENTRICULAR BEATS ARE NOW PRESENT QT HAS SHORTENED Confirmed by Rose-jones, Lisa ([id number]) on 05/15/2019 8:14:50 PM  Xr Chest Portable    Result Date: 05/15/2019  EXAM: XR CHEST PORTABLE DATE: 05/15/2019 6:44 PM ACCESSION: [id number] UN DICTATED: 05/15/2019 6:58 PM INTERPRETATION LOCATION: [facility] CLINICAL INDICATION: 86 years old Female with AMS ; OTHER  COMPARISON: Chest radiograph 04/20/2019. TECHNIQUE: Portable Chest Radiograph. FINDINGS: Lungs radiographically clear. Please note that left upper lung is obscured. No pleural effusion or pneumothorax. Stable cardiomediastinal silhouette including tortuous descending thoracic aorta.     No acute airspace disease.    Xr Abdomen 1 View    Result Date: 05/16/2019  EXAM: XR ABDOMEN 1 VIEW DATE: 05/16/2019 4:15 PM ACCESSION: [id number] UN DICTATED: 05/16/2019 4:32 PM INTERPRETATION LOCATION: [facility] CLINICAL INDICATION: 86 years old Female with AMS, c/f constipation ; OTHER  COMPARISON: None. TECHNIQUE: Supine view of the abdomen. FINDINGS: Moderate stool burden with nonobstructive bowel gas pattern. Surgical clips in the right upper quadrant. Degenerative changes of the lumbar spine. The visualized lung bases are clear.     Moderate stool burden with nonobstructive bowel gas pattern.      Discharge Instructions:      Activity Instructions     Activity as tolerated            Diet Instructions     Discharge diet (specify)      Discharge Nutrition Therapy: General          Other Instructions     Discharge instructions      I certify that based on my evaluation of this patient, this patient requires BLS transportation services and that other forms of transport are contraindicated.  Please refer to care management transitions note for transportation details.         I certify that based on my evaluation of this patient, this patient requires BLS transportation services and that other forms of transport are contraindicated.  Please refer to care management transitions note for transportation details.    Discharge instructions      Discharge to:  [facility]  [address]  [place] [address]  Phone [phone]         Discharge to:  [facility]  [address]  [place] [address]  Phone [phone]    Discharge instructions      I certify that based on my evaluation of this patient, this patient requires BLS transportation services and that other forms of transport are contraindicated.  Please refer to care management transitions note for transportation details.         I certify that based on my evaluation of this patient, this patient requires BLS transportation services and that other forms of transport are contraindicated.  Please refer to care management transitions note for transportation details.    Discharge instructions      [ ]  Likely that clonazepam contributed to worsening delirium.  Would avoid benzodiazepines in this patient  [ ]  Held oxycodone, carbamazepine at discharge.  Started on pregabalin and duloxetine for postherpetic neuralgia  [ ]  If patient has worsening delirium, agitation at night, recommend increasing pregabalin dosing   [ ]  Ruxolitinib held at discharge, documented 12% insomnia ADR, was sleeping poorly at SNF and concerned that this may have also contributed to delirium. Sleep improved off of the medication         [ ]  Likely that clonazepam contributed to worsening delirium.  Would avoid benzodiazepines in this patient  [ ]  Held oxycodone, carbamazepine at discharge.  Started on pregabalin and duloxetine for postherpetic neuralgia  [ ]  If patient has worsening delirium,  agitation at night, recommend increasing pregabalin dosing   [ ]  Ruxolitinib held at discharge, documented 12% insomnia ADR, was sleeping poorly at SNF and concerned that this may have also contributed to delirium. Sleep improved off of the medication          Follow Up instructions and Outpatient Referrals     Discharge instructions      I certify that based on my evaluation of this patient, this patient requires BLS transportation services and that other forms of transport are contraindicated.  Please refer to care management transitions note for transportation details.    Discharge instructions      Discharge to:  [facility]  [address]  [place] [address]  Phone [phone]    Discharge instructions      I certify that based on my evaluation of this patient, this patient requires BLS transportation services and that other forms of transport are contraindicated.  Please refer to care management transitions note for transportation details.    Discharge instructions      [ ]  Likely that clonazepam contributed to worsening delirium.  Would avoid benzodiazepines in this patient  [ ]  Held oxycodone, carbamazepine at discharge.  Started on pregabalin and duloxetine for postherpetic neuralgia  [ ]  If patient has worsening delirium, agitation at night, recommend increasing pregabalin dosing   [ ]  Ruxolitinib held at discharge, documented 12% insomnia ADR, was sleeping poorly at SNF and concerned that this may have also contributed to delirium. Sleep improved off of the medication          Appointments which have been scheduled for you    May 30, 2019 10:30 AM  (Arrive by 10:00 AM)  RETURN VIDEO - OTHER with Vernie Murders, AGNP  [place] [facility]) [address]  [place] [address]  [phone]

## 2019-05-20 NOTE — Unmapped (Signed)
Pt free of falls and injuries this shift. Pts son at the bedside. IV access lost and replaced to receive IV abx. No complaints of pain.     Problem: Adult Inpatient Plan of Care  Goal: Plan of Care Review  Outcome: Progressing  Goal: Patient-Specific Goal (Individualization)  Outcome: Progressing  Goal: Absence of Hospital-Acquired Illness or Injury  Outcome: Progressing  Goal: Optimal Comfort and Wellbeing  Outcome: Progressing  Goal: Readiness for Transition of Care  Outcome: Progressing  Goal: Rounds/Family Conference  Outcome: Progressing     Problem: Self-Care Deficit  Goal: Improved Ability to Complete Activities of Daily Living  Outcome: Progressing     Problem: Skin Injury Risk Increased  Goal: Skin Health and Integrity  Outcome: Progressing

## 2019-05-21 MED ORDER — DULOXETINE 20 MG CAPSULE,DELAYED RELEASE
Freq: Every day | ORAL | 0 refills | 0.00000 days
Start: 2019-05-21 — End: ?

## 2019-05-22 NOTE — Unmapped (Signed)
CONTINUING CARE NETWORK  Enrollment Note        Patient admitted to [facility] and enrolled in the Contin[facility] Care [facility]. Case Management to follow up within 1 week to complete medication reconciliation and discuss transition planning.

## 2019-05-23 NOTE — Unmapped (Signed)
I LVM with daughter in law of patient Sheenah Dimitroff Bulnes to confirm appointments on the following date(s): 4/5 [facility] visit    Collie Siad

## 2019-05-23 NOTE — Unmapped (Signed)
I spoke with son of patient Hayley Wagner to confirm appointments on the following date(s): 4/5 [name] in person visit    [name]

## 2019-05-23 NOTE — Unmapped (Signed)
I attempted to call patient Hayley Wagner to confirm appointments on the following date(s): 4/5 [name] in person visit. VM full, unable to LVM.    [name]

## 2019-05-25 NOTE — Unmapped (Signed)
CONTINUING CARE NETWORK  SNF FOLLOW UP NOTE  Summary:  Hayley Wagner is an 86 y.o. female patient currently at [facility] who is being followed by the [facility] Continuing [employer]. Plan is continue rehab.  Next follow up with SNF care team member in one week.        Subjective:   Medication changes: Yes: see home medications. Medication list updated.    Care team member, Darel Hong, reported no discharge date.      Interventions provided: medication reconciliation, chart review and supportive listening  Education provided: to notify CM of any change in patient condition or discharge plan  Barriers to care: Transportation and Fall Risk  Care Coordination Note updated in [facility]: Yes    Discuss at next visit: Transition planning     Objective:    Patient Active Problem List   Diagnosis   ??? 2-vessel coronary artery disease   ??? Thoracoabdominal aneurysm (CMS-HCC)   ??? Chest pain, unspecified   ??? Chronic kidney disease, stage 3   ??? Descending thoracic aortic aneurysm (CMS-HCC)   ??? Ectatic thoracic aorta (CMS-HCC)   ??? Essential hypertension   ??? Hyperlipidemia   ??? IGT (impaired glucose tolerance)   ??? Increased frequency of urination   ??? Laxity of skin   ??? Palpitations   ??? Rectocele, female   ??? Rosacea   ??? Primary myelofibrosis (CMS-HCC)   ??? Altered mental status   ??? AKI (acute kidney injury) (CMS-HCC)   ??? UTI (urinary tract infection)   ??? Fall   ??? Shingles   ??? GERD (gastroesophageal reflux disease)   ??? Elevated troponin       Future Appointments   Date Time Provider Department Center   05/29/2019  2:45 PM Vito Berger, MD [id number]

## 2019-05-29 ENCOUNTER — Ambulatory Visit: Admit: 2019-05-29 | Discharge: 2019-05-30 | Payer: MEDICARE | Attending: Internal Medicine | Primary: Internal Medicine

## 2019-05-29 NOTE — Unmapped (Signed)
Myeloproliferative Neoplasms & Bone Marrow Failure Clinic Established Visit    Patient: Hayley Wagner  MRN: [id number]  DOB: 1928/07/20  Date of Visit: 05/29/2019      Reason for Visit   Hayley Wagner is a 86 y.o. 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    Assessment   #1 Primary myelofibrosis, JAK2 V617F-mutated  Dx:  05/19/2018  WHO 2016 criteria met: 1) bone marrow findings with MF-2, 2) JAK2 mutation, 3) doesn't meet criteria for ET, PV, CML, 4) LDH increased, 5) nucleated RBCs, 6) anemia not attributed to a comorbid condition  Driver Mutation: JAK2 [id number]  Prognostic Scores:  DIPSS: Int-2  DIPSS Plus: Int-2, median OS 2.9 years.   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  She was previously AO x 4 and quite sharp in conversation, but now is AO x 2 (was not aware of situation and thought the year was 2017), is somewhat inattentive, is having visual hallucinations.  She has some insight into this, noting that she feels stuck between reality and another world.  This all began relatively suddenly in 03/2019 after she had delirium associated with a UTI followed by facial zoster reactivation.  Then she had terrible post-herpetic neuralgia and was on multiple medications for this, thought causing delirium from polypharmacy.  She was most recently admitted to [facility] from 3/22 - 3/27 for worsened AMS and taken off many meds with some improvement but is definitely not back to baseline.  The patient, previously pristine in poise in late 2020, is now slouched forward in a wheelchair, intermittently participating in conversation at times with appropriate responses to questions and at times with utter nonsense.     Her daughter-in-law brings her today.  I am told that one son wishes to be aggressive in looking for reversible causes, but another does not wish to subject his mother to invasive tests and procedures.      Regarding myelofibrosis, the disease is very unlikely contributing to the AMS.  I very much doubt that ruxolitinib was directly contributing, but may have left the patient susceptible to infection (such as the zoster reactivation) and thus contributed in that way.  I do not see a strong indication to resume the ruxolitinib at the present time given the situation.      I think the bigger issue presently is whether to do more about the workup of AMS.  I think the patient would benefit from seeing a geriatrician and have recommended Dr. Cora Daniels with whom she established a good rapport while hospitalized.      For the time being, I think it is reasonable to proceed with a lumbar puncture if the patient is able to lie still in fluoroscopy for evaluation of infection, particularly cryptococcus (risk factors: MF, EtOH use, ruxolitinib).  A lumbar puncture was apparently attempted at bedside during recent hospitalization, but could not be done due to anatomy and fluoroscopy was not available on-site.         Plan   1. Hold ruxolitinib.   2. Geriatrics referral to Dr. Cora Daniels, [facility]  3. Fluoroscopy-guided LP for evaluation of reversible causes of AMS including cryptococcus.  Would send for: cell count, glucose, protein, culture, Gram stain, cryptococcal antigen, HSV PCR  4. Telehealth visit with [name] in 4-6 weeks  Rozetta Nunnery, MD  Hematology    I spent 70 minutes on the encounter, in face-to-face discussion with the patient and her daughter in law, on pre-visit chart review, post-visit communication with Dr. Cora Daniels and coordinating LP.     Interval History:  Doing better than at her previous visit.  Was initially started at rux 20mg  BID and was just reduced to 10mg  BID.  Currently feels quite stressed with family issues.  BP is sporadically high.  Weakness and fatigue are improved, along with night sweats.  Constipation is an issue - taking stools softener, miralax, prune juice.  Acid reflux is a big problem for her.  Erythromelalgia has resolved.      Otherwise, denies new bone pain, fevers, chills, night sweats, lumps/bumps, tongue swelling, shortness of breath, syncope, lightheadedness, constipation or diarrhea, nausea or vomiting, very easy bruising or bleeding, or urinary changes.       History of Present Illness   86 y.o.  with CAD, thoracoabnodmal aneurysm, HTN, CKD stage 3, iron-defiency anemia, and JAK2+ MPN.    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  Still does have pain in both sides, but overall improving.  Had a temporal artery biopsy - was negative.  Had been started on steroids prior to the biopsy (but she goes nuts on steroids) but says that she felt no better while on prednisone.  She reports that her ophthalmologist said that she has corneal edema and there is swelling in the left. She is frustrated because can't really read anymore and can't drive.      Her biggest complaint, though is that in March 2020, she developed significant fatigue and weakness, citing that she slept for a couple of weeks.  Didn't even feel improved after IV iron infusion on 06/15/2018.  However, as of 4 weeks ago, she feels back to her baseline.  She does not know what has changed to elicit this response.  She did not have fevers.  She also started hydroxyurea in late April/early May, so possibly was related to this.  The last CBC was from 06/30/2018 and showed WBC 6.7, hgb 9.6, MCV 102, platelets 670.        Review of Systems   10 point ROS otherwise negative unless mentioned in history.     Medications     Current Outpatient Medications   Medication Sig Dispense Refill   ??? acetaminophen (TYLENOL) 500 MG tablet Take 1,000 mg by mouth every eight (8) hours.     ??? ascorbic acid, vitamin C, (VITAMIN C) 250 MG tablet Take 250 mg by mouth daily.     ??? aspirin 81 MG chewable tablet Chew 81 mg daily.      ??? betamethasone valerate (VALISONE) 0.1 % lotion Apply 1 application topically Two (2) times a day.      ??? carboxymethylcellulose sodium (THERATEARS) 0.25 % Drop Administer 1 drop to both eyes Six (6) times a day.  0   ??? cholecalciferol, vitamin D3-50 mcg, 2,000 unit,, 50 mcg (2,000 unit) tablet Take 1 tablet by mouth daily.     ??? DULoxetine (CYMBALTA) 20 MG capsule Take 1 capsule (20 mg total) by mouth daily.  0   ??? lidocaine (LMX) 4 % cream Apply topically Three (3) times a day as needed (Apply to face for face pain). 30 g 0   ??? lidocaine 2% viscous (XYLOCAINE) 2 % Soln 15 mL by  Mouth route every four (4) hours as needed (for mouth pain prior to eating). 1 Bottle 0   ??? melatonin 3 mg Tab Take 2 tablets (6 mg total) by mouth every evening. At 6pm  0   ??? multivitamin (TAB-A-VITE/THERAGRAN) per tablet Take 1 tablet by mouth daily.      ??? polyethylene glycol (MIRALAX) 17 gram packet Take 17 g by mouth daily.     ??? pregabalin (LYRICA) 50 MG capsule Take 1 capsule (50 mg total) by mouth nightly.  0   ??? RESTASIS MULTIDOSE 0.05 % Drop Administer 1 drop to both eyes nightly as needed.      ??? senna (SENOKOT) 8.6 mg tablet Take 2 tablets by mouth nightly as needed for constipation.  0   ??? simvastatin (ZOCOR) 20 MG tablet TAKE 1 TABLET BY MOUTH ONCE DAILY     ??? valACYclovir (VALTREX) 500 MG tablet Take 1 tablet (500 mg total) by mouth daily.  0     No current facility-administered medications for this visit.        Allergies   No Known Allergies    Past Medical and Surgical History     Past Medical History:   Diagnosis Date   ??? Anemia    ??? At risk for falls    ??? CAD (coronary artery disease)    ??? Cancer (CMS-HCC)    ??? Chronic kidney disease    ??? GERD (gastroesophageal reflux disease)    ??? Hyperlipidemia    ??? Hypertension      No past surgical history on file.    Social History     Social History     Socioeconomic History   ??? Marital status: Widowed     Spouse name: Not on file   ??? Number of children: Not on file   ??? Years of education: Not on file   ??? Highest education level: Not on file   Occupational History   ??? Not on file   Social Needs   ??? Financial resource strain: Not very hard   ??? Food insecurity     Worry: Never true     Inability: Never true   ??? Transportation needs     Medical: No     Non-medical: No   Tobacco Use   ??? Smoking status: Former Smoker   ??? Smokeless tobacco: Never Used   Substance and Sexual Activity   ??? Alcohol use: Yes   ??? Drug use: Never   ??? Sexual activity: Not on file   Lifestyle   ??? Physical activity     Days per week: Not on file     Minutes per session: Not on file   ??? Stress: Not on file   Relationships   ??? Social Wellsite geologist on phone: Not on file     Gets together: Not on file     Attends religious service: Not on file     Active member of club or organization: Not on file     Attends meetings of clubs or organizations: Not on file     Relationship status: Widowed   Other Topics Concern   ??? Not on file   Social History Narrative   ??? Not on file   Lives independently with two cats.  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  Family History   Sister with Alzheimer disease  No known blood cancers    Physical Examination     Vitals:    05/29/19 1517   BP: 105/51   Pulse: 70   Resp: 18   Temp: 36.3 ??C (97.4 ??F)   TempSrc: Oral   SpO2: 100%   Weight: 48.7 kg (107 lb 4.8 oz)   Height: 144.8 cm (4' 9)       GENERAL:  Arrives in a wheelchair.  She is generally resting comfortably, with a few episodes of inattentiveness where she grabs at various objects around her (the couch, the bed).  She is accompanied by her daughter in law.   NEURO: Alert.  Oriented to person and month.  Thinks the year is 2021.  She was not aware that she was at a clinic visit.  She recognized her daughter in law.  She answers most questions appropriately, but in a few instances, would answer with a nonsense response.  She indicates that she feels that she is in-between reality and another world, noting that she has started to see things that those around her cannot, but that they seem very real.        Laboratory Testing and Imaging     Results for orders placed or performed during the hospital encounter of 05/15/19   Blood Culture #1    Specimen: 1 Peripheral Draw; Blood   Result Value Ref Range    Blood Culture, Routine No Growth at 5 days    Blood Culture #2    Specimen: 1 Peripheral Draw; Blood   Result Value Ref Range    Blood Culture, Routine No Growth at 5 days    Urine Culture    Specimen: Catheterized-In and Out Catheter; Urine   Result Value Ref Range    Urine Culture, Comprehensive 50,000 to 100,000 CFU/mL Pseudomonas aeruginosa (A)     Urine Culture, Comprehensive 50,000 to 100,000 CFU/mL Enterococcus faecium (A)        Susceptibility    Enterococcus faecium - KIRBY BAUER     Ampicillin*  Susceptible       * For Enterococcus spp., cephalosporins, aminoglycosides, clindamycin and trimethoprim sulfamethoxazole may appear active in vitro but are not effective clinically and are not tested in this laboratory.     Nitrofurantoin  Intermediate      Doxycycline  Resistant      Vancomycin Screening Plate  Susceptible     Pseudomonas aeruginosa - KIRBY BAUER     Ceftazidime  Susceptible      Ciprofloxacin  Susceptible      Gentamicin  Susceptible      Levofloxacin  Intermediate      Piperacillin + Tazobactam  Susceptible      Tobramycin  Susceptible    COVID-19 PCR    Specimen: Nasopharyngeal Swab   Result Value Ref Range    SARS-CoV-2 PCR Not Detected Not Detected   Troponin I   Result Value Ref Range    Troponin I 0.134 (HH) <0.034 ng/mL   Comprehensive Metabolic Panel   Result Value Ref Range    Sodium 145 135 - 145 mmol/L    Potassium 5.1 (H) 3.5 - 5.0 mmol/L    Chloride 111 (H) 98 - 107 mmol/L    Anion Gap 10 7 - 15 mmol/L    CO2 24.0 22.0 - 30.0 mmol/L    BUN 46 (H) 7 - 21 mg/dL    Creatinine 2.59 (H) 0.60 - 1.00 mg/dL  BUN/Creatinine Ratio 29     EGFR CKD-EPI Non-African American, Female 29 (L) >=60 mL/min/1.25m2    EGFR CKD-EPI African American, Female 33 (L) >=60 mL/min/1.52m2    Glucose 126 70 - 179 mg/dL    Calcium 9.3 8.5 - 19.1 mg/dL    Albumin 4.3 3.5 - 5.0 g/dL    Total Protein 6.8 6.5 - 8.3 g/dL    Total Bilirubin 0.8 0.0 - 1.2 mg/dL    AST 79 (H) 14 - 38 U/L    ALT 58 (H) <35 U/L    Alkaline Phosphatase 117 38 - 126 U/L   TSH   Result Value Ref Range    TSH 2.348 0.600 - 3.300 uIU/mL   Osmolality, Serum   Result Value Ref Range    Osmolality Meas 320 (H) 275 - 295 mOsm/kg   Toxicology Screen, Urine   Result Value Ref Range    Amphetamine Screen, Ur <500 ng/mL Not Applicable    Barbiturate Screen, Ur <200 ng/mL Not Applicable    Benzodiazepine Screen, Urine <200 ng/mL Not Applicable    Cannabinoid Scrn, Ur <20 ng/mL Not Applicable    Methadone Screen, Urine <300 ng/mL Not Applicable    Cocaine(Metab.)Screen, Urine <150 ng/mL Not Applicable    Opiate Scrn, Ur <300 ng/mL Not Applicable   Urinalysis   Result Value Ref Range    Color, UA Yellow     Clarity, UA Turbid     Specific Gravity, UA 1.023 1.003 - 1.030    pH, UA 5.0 5.0 - 9.0    Leukocyte Esterase, UA Moderate (A) Negative    Nitrite, UA Negative Negative    Protein, UA 100 mg/dL (A) Negative    Glucose, UA Negative Negative    Ketones, UA Trace (A) Negative    Urobilinogen, UA 4.0 mg/dL (A) 0.2 mg/dL, 1.0 mg/dL    Bilirubin, UA Negative Negative    Blood, UA Small (A) Negative    RBC, UA 64 (H) <=4 /HPF    WBC, UA >182 (H) 0 - 5 /HPF    Squam Epithel, UA 8 (H) 0 - 5 /HPF    Bacteria, UA Many (A) None Seen /HPF    Mucus, UA Few (A) None Seen /HPF   Magnesium Level   Result Value Ref Range    Magnesium 2.2 1.6 - 2.2 mg/dL   Phosphorus Level   Result Value Ref Range    Phosphorus 4.6 2.9 - 4.7 mg/dL   Lactate, Venous, Whole Blood   Result Value Ref Range    Lactate, Venous 2.1 (H) 0.5 - 1.8 mmol/L   Lactate, Venous, Whole Blood   Result Value Ref Range    Lactate, Venous 2.2 (H) 0.5 - 1.8 mmol/L   Troponin I   Result Value Ref Range    Troponin I 0.129 (HH) <0.034 ng/mL   Basic metabolic panel   Result Value Ref Range    Sodium 148 (H) 135 - 145 mmol/L    Potassium 4.0 3.5 - 5.0 mmol/L    Chloride 113 (H) 98 - 107 mmol/L    CO2 25.0 22.0 - 30.0 mmol/L    Anion Gap 10 7 - 15 mmol/L    BUN 40 (H) 7 - 21 mg/dL    Creatinine 4.78 (H) 0.60 - 1.00 mg/dL    BUN/Creatinine Ratio 38     EGFR CKD-EPI Non-African American, Female 47 (L) >=60 mL/min/1.54m2    EGFR CKD-EPI African American, Female 54 (L) >=60 mL/min/1.78m2    Glucose  83 70 - 179 mg/dL    Calcium 8.6 8.5 - 16.1 mg/dL   CBC   Result Value Ref Range    WBC 5.4 3.5 - 10.5 10*9/L    RBC 2.51 (L) 3.90 - 5.03 10*12/L    HGB 8.7 (L) 12.0 - 15.5 g/dL    HCT 09.6 (L) 04.5 - 44.0 %    MCV 105.1 (H) 82.0 - 98.0 fL    MCH 34.6 (H) 26.0 - 34.0 pg    MCHC 32.9 30.0 - 36.0 g/dL    RDW 40.9 (H) 81.1 - 15.0 %    MPV 9.3 7.0 - 10.0 fL    Platelet 337 150 - 450 10*9/L   Magnesium Level   Result Value Ref Range    Magnesium 2.0 1.6 - 2.2 mg/dL   Phosphorus Level   Result Value Ref Range    Phosphorus 3.5 2.9 - 4.7 mg/dL   Lactate, Venous, Whole Blood   Result Value Ref Range    Lactate, Venous 0.7 0.5 - 1.8 mmol/L   Hepatic Function Panel   Result Value Ref Range    Albumin 2.9 (L) 3.5 - 5.0 g/dL    Total Protein 5.1 (L) 6.5 - 8.3 g/dL    Total Bilirubin 0.5 0.0 - 1.2 mg/dL    Bilirubin, Direct 9.14 0.00 - 0.40 mg/dL    AST 46 (H) 14 - 38 U/L    ALT 47 (H) <35 U/L    Alkaline Phosphatase 96 38 - 126 U/L   Vitamin B12 Level   Result Value Ref Range    Vitamin B-12 >1,000 (H) 193 - 900 pg/ml   Urinalysis   Result Value Ref Range    Color, UA Yellow     Clarity, UA Turbid     Specific Gravity, UA 1.025 1.005 - 1.040    pH, UA 5.5 5.0 - 9.0    Leukocyte Esterase, UA Moderate (A) Negative    Nitrite, UA Positive (A) Negative    Protein, UA 100 mg/dL (A) Negative    Glucose, UA Negative Negative    Ketones, UA 15 mg/dL (A) Negative    Urobilinogen, UA 1.0 mg/dL 0.2 - 2.0 mg/dL    Bilirubin, UA Negative Negative    Blood, UA Small (A) Negative    RBC, UA >100 (H) <4 /HPF    WBC, UA >100 (H) 0 - 5 /HPF    Squam Epithel, UA 7 (H) 0 - 5 /HPF    Bacteria, UA None Seen None Seen /HPF    Mucus, UA Occasional (A) None Seen /HPF   CBC   Result Value Ref Range    WBC 7.7 3.5 - 10.5 10*9/L    RBC 2.62 (L) 3.90 - 5.03 10*12/L    HGB 9.1 (L) 12.0 - 15.5 g/dL    HCT 78.2 (L) 95.6 - 44.0 %    MCV 107.6 (H) 82.0 - 98.0 fL    MCH 34.9 (H) 26.0 - 34.0 pg    MCHC 32.4 30.0 - 36.0 g/dL    RDW 21.3 (H) 08.6 - 15.0 %    MPV 9.6 7.0 - 10.0 fL    Platelet 355 150 - 450 10*9/L   Comprehensive Metabolic Panel   Result Value Ref Range    Sodium 150 (H) 135 - 145 mmol/L    Potassium 4.0 3.5 - 5.0 mmol/L    Chloride 113 (H) 98 - 107 mmol/L    Anion Gap 12 7 - 15 mmol/L  CO2 25.0 22.0 - 30.0 mmol/L    BUN 39 (H) 7 - 21 mg/dL    Creatinine 9.60 4.54 - 1.00 mg/dL    BUN/Creatinine Ratio 41     EGFR CKD-EPI Non-African American, Female 54 (L) >=60 mL/min/1.29m2    EGFR CKD-EPI African American, Female 32 >=60 mL/min/1.80m2    Glucose 116 70 - 179 mg/dL    Calcium 9.1 8.5 - 09.8 mg/dL    Albumin 3.5 3.5 - 5.0 g/dL    Total Protein 6.0 (L) 6.5 - 8.3 g/dL    Total Bilirubin 0.7 0.0 - 1.2 mg/dL    AST 57 (H) 14 - 38 U/L    ALT 51 (H) <35 U/L    Alkaline Phosphatase 115 38 - 126 U/L   HIV Antigen/Antibody Combo   Result Value Ref Range    HIV Antigen/Antibody Combo Nonreactive Nonreactive   Magnesium Level   Result Value Ref Range    Magnesium 1.9 1.6 - 2.2 mg/dL   Phosphorus Level   Result Value Ref Range    Phosphorus 3.1 2.9 - 4.7 mg/dL   Comprehensive Metabolic Panel   Result Value Ref Range    Sodium 152 (H) 135 - 145 mmol/L    Potassium 3.7 3.5 - 5.0 mmol/L    Chloride 117 (H) 98 - 107 mmol/L    Anion Gap 10 7 - 15 mmol/L    CO2 25.0 22.0 - 30.0 mmol/L    BUN 39 (H) 7 - 21 mg/dL    Creatinine 1.19 1.47 - 1.00 mg/dL    BUN/Creatinine Ratio 47     EGFR CKD-EPI Non-African American, Female 63 >=60 mL/min/1.53m2    EGFR CKD-EPI African American, Female 71 >=60 mL/min/1.1m2    Glucose 98 70 - 179 mg/dL    Calcium 8.6 8.5 - 82.9 mg/dL    Albumin 2.8 (L) 3.5 - 5.0 g/dL    Total Protein 5.2 (L) 6.5 - 8.3 g/dL    Total Bilirubin 0.5 0.0 - 1.2 mg/dL    AST 46 (H) 14 - 38 U/L    ALT 44 (H) <35 U/L    Alkaline Phosphatase 86 38 - 126 U/L   Magnesium Level   Result Value Ref Range    Magnesium 2.0 1.6 - 2.2 mg/dL   Phosphorus Level   Result Value Ref Range    Phosphorus 2.9 2.9 - 4.7 mg/dL   Sodium   Result Value Ref Range    Sodium 142 135 - 145 mmol/L   Comprehensive Metabolic Panel   Result Value Ref Range    Sodium 145 135 - 145 mmol/L    Potassium 4.1 3.5 - 5.0 mmol/L    Chloride 114 (H) 98 - 107 mmol/L    Anion Gap 10 7 - 15 mmol/L    CO2 21.0 (L) 22.0 - 30.0 mmol/L    BUN 38 (H) 7 - 21 mg/dL    Creatinine 5.62 1.30 - 1.00 mg/dL    BUN/Creatinine Ratio 48     EGFR CKD-EPI Non-African American, Female 46 >=60 mL/min/1.29m2    EGFR CKD-EPI African American, Female 60 >=60 mL/min/1.70m2    Glucose 81 70 - 179 mg/dL    Calcium 8.6 8.5 - 86.5 mg/dL    Albumin 3.0 (L) 3.5 - 5.0 g/dL    Total Protein 5.5 (L) 6.5 - 8.3 g/dL    Total Bilirubin 0.8 0.0 - 1.2 mg/dL    AST 57 (H) 14 - 38 U/L    ALT 43 (H) <  35 U/L    Alkaline Phosphatase 82 38 - 126 U/L   Magnesium Level   Result Value Ref Range    Magnesium 1.9 1.6 - 2.2 mg/dL   Phosphorus Level   Result Value Ref Range    Phosphorus 2.7 (L) 2.9 - 4.7 mg/dL   CBC   Result Value Ref Range    WBC 7.9 3.5 - 10.5 10*9/L    RBC 2.45 (L) 3.90 - 5.03 10*12/L    HGB 8.5 (L) 12.0 - 15.5 g/dL    HCT 08.6 (L) 57.8 - 44.0 %    MCV 108.3 (H) 82.0 - 98.0 fL    MCH 34.9 (H) 26.0 - 34.0 pg    MCHC 32.2 30.0 - 36.0 g/dL    RDW 46.9 (H) 62.9 - 15.0 %    MPV 9.9 7.0 - 10.0 fL    Platelet 287 150 - 450 10*9/L    nRBC 4 <=4 /100 WBCs   Sodium   Result Value Ref Range    Sodium 145 135 - 145 mmol/L   Comprehensive Metabolic Panel   Result Value Ref Range Sodium 142 135 - 145 mmol/L    Potassium 3.8 3.5 - 5.0 mmol/L    Chloride 111 (H) 98 - 107 mmol/L    Anion Gap 6 (L) 7 - 15 mmol/L    CO2 25.0 22.0 - 30.0 mmol/L    BUN 36 (H) 7 - 21 mg/dL    Creatinine 5.28 4.13 - 1.00 mg/dL    BUN/Creatinine Ratio 51     EGFR CKD-EPI Non-African American, Female 76 >=60 mL/min/1.51m2    EGFR CKD-EPI African American, Female 38 >=60 mL/min/1.29m2    Glucose 78 70 - 179 mg/dL    Calcium 8.2 (L) 8.5 - 10.2 mg/dL    Albumin 2.4 (L) 3.5 - 5.0 g/dL    Total Protein 4.7 (L) 6.5 - 8.3 g/dL    Total Bilirubin 0.5 0.0 - 1.2 mg/dL    AST 46 (H) 14 - 38 U/L    ALT 38 (H) <35 U/L    Alkaline Phosphatase 68 38 - 126 U/L   Magnesium Level   Result Value Ref Range    Magnesium 1.8 1.6 - 2.2 mg/dL   Phosphorus Level   Result Value Ref Range    Phosphorus 2.9 2.9 - 4.7 mg/dL   ECG 12 Lead   Result Value Ref Range    EKG Systolic BP  mmHg    EKG Diastolic BP  mmHg    EKG Ventricular Rate 91 BPM    EKG Atrial Rate 91 BPM    EKG P-R Interval 148 ms    EKG QRS Duration 68 ms    EKG Q-T Interval 358 ms    EKG QTC Calculation 440 ms    EKG Calculated P Axis 66 degrees    EKG Calculated R Axis -2 degrees    EKG Calculated T Axis 35 degrees    QTC Fredericia 411 ms   POCT Glucose   Result Value Ref Range    Glucose, POC 121 70 - 179 mg/dL   CBC w/ Differential   Result Value Ref Range    WBC 7.7 4.5 - 11.0 10*9/L    RBC 2.88 (L) 4.00 - 5.20 10*12/L    HGB 10.1 (L) 12.0 - 16.0 g/dL    HCT 24.4 (L) 01.0 - 46.0 %    MCV 111.8 (H) 80.0 - 100.0 fL    MCH 35.0 (H) 26.0 - 34.0  pg    MCHC 31.3 31.0 - 37.0 g/dL    RDW 16.1 (H) 09.6 - 15.0 %    MPV 10.5 (H) 7.0 - 10.0 fL    Platelet 456 (H) 150 - 440 10*9/L    nRBC 6 (H) <=4 /100 WBCs    Neutrophils % 57.5 %    Lymphocytes % 22.7 %    Monocytes % 13.6 %    Eosinophils % 0.9 %    Basophils % 1.3 %    Absolute Neutrophils 4.4 2.0 - 7.5 10*9/L    Absolute Lymphocytes 1.8 1.5 - 5.0 10*9/L    Absolute Monocytes 1.1 (H) 0.2 - 0.8 10*9/L    Absolute Eosinophils 0.1 0.0 - 0.4 10*9/L    Absolute Basophils 0.1 0.0 - 0.1 10*9/L    Large Unstained Cells 4 0 - 4 %    Microcytosis Slight (A) Not Present    Macrocytosis Marked (A) Not Present    Anisocytosis Marked (A) Not Present    Hypochromasia Marked (A) Not Present   Morphology Review   Result Value Ref Range    Smear Review Comments See Comment (A) Undefined    Ovalocytes Moderate (A) Not Present    Tear Drop Cells Moderate (A) Not Present    Burr Cells Present (A) Not Present    Poikilocytosis Moderate (A) Not Present   Morphology Review   Result Value Ref Range    Smear Review Comments See Comment (A) Undefined    Polychromasia Slight (A) Not Present    Basophilic Stippling Present (A) Not Present

## 2019-05-29 NOTE — Unmapped (Signed)
Created in error

## 2019-05-29 NOTE — Unmapped (Addendum)
#1 Myelofibrosis - stable - off Jakafi  #2 memory issues - will touch base with Dr. Cora Daniels about appointment. ? Cryptococcus (seems somewhat unlikely, but the change is abrupt). ?additional imaging or testing.     Plan:  1. Referral to Dr. Cora Daniels  2. Hold Jakafi for now  3. Follow-up by telehealth visit in 4-6 weeks    Hayley Wagner, M.D.  Assistant Professor  Division of Hematology    Co-PI, [facility] MDS [facility]  Member, [employer]  Member, [facility]      [address]  [place] [address]  [address]  [place] [address]    Questions and appointments M-F 8am - 5pm: [phone] or [phone]    Myeloproliferative Neoplasms & Bone Marrow Failure Clinical Team  Nurse Navigator: [name], RN - [phone]  New Patient Intake Coordinator: [name]  Return Patient Scheduler: [name]    Nurse Practitioner: [name], AGNP  Clinical Pharmacist: [name], CPP  Medication Access Specialist: [name]        Results for orders placed or performed during the hospital encounter of 05/15/19   Blood Culture #1    Specimen: 1 Peripheral Draw; Blood   Result Value Ref Range    Blood Culture, Routine No Growth at 5 days    Blood Culture #2    Specimen: 1 Peripheral Draw; Blood   Result Value Ref Range    Blood Culture, Routine No Growth at 5 days    Urine Culture    Specimen: Catheterized-In and Out Catheter; Urine   Result Value Ref Range    Urine Culture, Comprehensive 50,000 to 100,000 CFU/mL Pseudomonas aeruginosa (A)     Urine Culture, Comprehensive 50,000 to 100,000 CFU/mL Enterococcus faecium (A)        Susceptibility    Enterococcus faecium - KIRBY BAUER     Ampicillin*  Susceptible       * For Enterococcus spp., cephalosporins, aminoglycosides, clindamycin and trimethoprim sulfamethoxazole may appear active in vitro but are not effective clinically and are not tested in this laboratory. Nitrofurantoin  Intermediate      Doxycycline  Resistant      Vancomycin Screening Plate  Susceptible     Pseudomonas aeruginosa - KIRBY BAUER     Ceftazidime  Susceptible      Ciprofloxacin  Susceptible      Gentamicin  Susceptible      Levofloxacin  Intermediate      Piperacillin + Tazobactam  Susceptible      Tobramycin  Susceptible    COVID-19 PCR    Specimen: Nasopharyngeal Swab   Result Value Ref Range    SARS-CoV-2 PCR Not Detected Not Detected   Troponin I   Result Value Ref Range    Troponin I 0.134 (HH) <0.034 ng/mL   Comprehensive Metabolic Panel   Result Value Ref Range    Sodium 145 135 - 145 mmol/L    Potassium 5.1 (H) 3.5 - 5.0 mmol/L    Chloride 111 (H) 98 - 107 mmol/L    Anion Gap 10 7 - 15 mmol/L    CO2 24.0 22.0 - 30.0 mmol/L    BUN 46 (H) 7 - 21 mg/dL    Creatinine 9.62 (H) 0.60 - 1.00 mg/dL    BUN/Creatinine Ratio 29     EGFR CKD-EPI Non-African American, Female 29 (L) >=60 mL/min/1.91m2    EGFR CKD-EPI African American, Female 33 (L) >=60  mL/min/1.88m2    Glucose 126 70 - 179 mg/dL    Calcium 9.3 8.5 - 09.8 mg/dL    Albumin 4.3 3.5 - 5.0 g/dL    Total Protein 6.8 6.5 - 8.3 g/dL    Total Bilirubin 0.8 0.0 - 1.2 mg/dL    AST 79 (H) 14 - 38 U/L    ALT 58 (H) <35 U/L    Alkaline Phosphatase 117 38 - 126 U/L   TSH   Result Value Ref Range    TSH 2.348 0.600 - 3.300 uIU/mL   Osmolality, Serum   Result Value Ref Range    Osmolality Meas 320 (H) 275 - 295 mOsm/kg   Toxicology Screen, Urine   Result Value Ref Range    Amphetamine Screen, Ur <500 ng/mL Not Applicable    Barbiturate Screen, Ur <200 ng/mL Not Applicable    Benzodiazepine Screen, Urine <200 ng/mL Not Applicable    Cannabinoid Scrn, Ur <20 ng/mL Not Applicable    Methadone Screen, Urine <300 ng/mL Not Applicable    Cocaine(Metab.)Screen, Urine <150 ng/mL Not Applicable    Opiate Scrn, Ur <300 ng/mL Not Applicable   Urinalysis   Result Value Ref Range    Color, UA Yellow     Clarity, UA Turbid     Specific Gravity, UA 1.023 1.003 - 1.030 pH, UA 5.0 5.0 - 9.0    Leukocyte Esterase, UA Moderate (A) Negative    Nitrite, UA Negative Negative    Protein, UA 100 mg/dL (A) Negative    Glucose, UA Negative Negative    Ketones, UA Trace (A) Negative    Urobilinogen, UA 4.0 mg/dL (A) 0.2 mg/dL, 1.0 mg/dL    Bilirubin, UA Negative Negative    Blood, UA Small (A) Negative    RBC, UA 64 (H) <=4 /HPF    WBC, UA >182 (H) 0 - 5 /HPF    Squam Epithel, UA 8 (H) 0 - 5 /HPF    Bacteria, UA Many (A) None Seen /HPF    Mucus, UA Few (A) None Seen /HPF   Magnesium Level   Result Value Ref Range    Magnesium 2.2 1.6 - 2.2 mg/dL   Phosphorus Level   Result Value Ref Range    Phosphorus 4.6 2.9 - 4.7 mg/dL   Lactate, Venous, Whole Blood   Result Value Ref Range    Lactate, Venous 2.1 (H) 0.5 - 1.8 mmol/L   Lactate, Venous, Whole Blood   Result Value Ref Range    Lactate, Venous 2.2 (H) 0.5 - 1.8 mmol/L   Troponin I   Result Value Ref Range    Troponin I 0.129 (HH) <0.034 ng/mL   Basic metabolic panel   Result Value Ref Range    Sodium 148 (H) 135 - 145 mmol/L    Potassium 4.0 3.5 - 5.0 mmol/L    Chloride 113 (H) 98 - 107 mmol/L    CO2 25.0 22.0 - 30.0 mmol/L    Anion Gap 10 7 - 15 mmol/L    BUN 40 (H) 7 - 21 mg/dL    Creatinine 1.19 (H) 0.60 - 1.00 mg/dL    BUN/Creatinine Ratio 38     EGFR CKD-EPI Non-African American, Female 47 (L) >=60 mL/min/1.105m2    EGFR CKD-EPI African American, Female 54 (L) >=60 mL/min/1.56m2    Glucose 83 70 - 179 mg/dL    Calcium 8.6 8.5 - 14.7 mg/dL   CBC   Result Value Ref Range    WBC 5.4 3.5 -  10.5 10*9/L    RBC 2.51 (L) 3.90 - 5.03 10*12/L    HGB 8.7 (L) 12.0 - 15.5 g/dL    HCT 29.5 (L) 62.1 - 44.0 %    MCV 105.1 (H) 82.0 - 98.0 fL    MCH 34.6 (H) 26.0 - 34.0 pg    MCHC 32.9 30.0 - 36.0 g/dL    RDW 30.8 (H) 65.7 - 15.0 %    MPV 9.3 7.0 - 10.0 fL    Platelet 337 150 - 450 10*9/L   Magnesium Level   Result Value Ref Range    Magnesium 2.0 1.6 - 2.2 mg/dL   Phosphorus Level   Result Value Ref Range    Phosphorus 3.5 2.9 - 4.7 mg/dL   Lactate, Venous, Whole Blood   Result Value Ref Range    Lactate, Venous 0.7 0.5 - 1.8 mmol/L   Hepatic Function Panel   Result Value Ref Range    Albumin 2.9 (L) 3.5 - 5.0 g/dL    Total Protein 5.1 (L) 6.5 - 8.3 g/dL    Total Bilirubin 0.5 0.0 - 1.2 mg/dL    Bilirubin, Direct 8.46 0.00 - 0.40 mg/dL    AST 46 (H) 14 - 38 U/L    ALT 47 (H) <35 U/L    Alkaline Phosphatase 96 38 - 126 U/L   Vitamin B12 Level   Result Value Ref Range    Vitamin B-12 >1,000 (H) 193 - 900 pg/ml   Urinalysis   Result Value Ref Range    Color, UA Yellow     Clarity, UA Turbid     Specific Gravity, UA 1.025 1.005 - 1.040    pH, UA 5.5 5.0 - 9.0    Leukocyte Esterase, UA Moderate (A) Negative    Nitrite, UA Positive (A) Negative    Protein, UA 100 mg/dL (A) Negative    Glucose, UA Negative Negative    Ketones, UA 15 mg/dL (A) Negative    Urobilinogen, UA 1.0 mg/dL 0.2 - 2.0 mg/dL    Bilirubin, UA Negative Negative    Blood, UA Small (A) Negative    RBC, UA >100 (H) <4 /HPF    WBC, UA >100 (H) 0 - 5 /HPF    Squam Epithel, UA 7 (H) 0 - 5 /HPF    Bacteria, UA None Seen None Seen /HPF    Mucus, UA Occasional (A) None Seen /HPF   CBC   Result Value Ref Range    WBC 7.7 3.5 - 10.5 10*9/L    RBC 2.62 (L) 3.90 - 5.03 10*12/L    HGB 9.1 (L) 12.0 - 15.5 g/dL    HCT 96.2 (L) 95.2 - 44.0 %    MCV 107.6 (H) 82.0 - 98.0 fL    MCH 34.9 (H) 26.0 - 34.0 pg    MCHC 32.4 30.0 - 36.0 g/dL    RDW 84.1 (H) 32.4 - 15.0 %    MPV 9.6 7.0 - 10.0 fL    Platelet 355 150 - 450 10*9/L   Comprehensive Metabolic Panel   Result Value Ref Range    Sodium 150 (H) 135 - 145 mmol/L    Potassium 4.0 3.5 - 5.0 mmol/L    Chloride 113 (H) 98 - 107 mmol/L    Anion Gap 12 7 - 15 mmol/L    CO2 25.0 22.0 - 30.0 mmol/L    BUN 39 (H) 7 - 21 mg/dL    Creatinine 4.01 0.27 - 1.00 mg/dL    BUN/Creatinine  Ratio 41     EGFR CKD-EPI Non-African American, Female 54 (L) >=60 mL/min/1.19m2    EGFR CKD-EPI African American, Female 64 >=60 mL/min/1.74m2    Glucose 116 70 - 179 mg/dL    Calcium 9.1 8.5 - 16.1 mg/dL    Albumin 3.5 3.5 - 5.0 g/dL    Total Protein 6.0 (L) 6.5 - 8.3 g/dL    Total Bilirubin 0.7 0.0 - 1.2 mg/dL    AST 57 (H) 14 - 38 U/L    ALT 51 (H) <35 U/L    Alkaline Phosphatase 115 38 - 126 U/L   HIV Antigen/Antibody Combo   Result Value Ref Range    HIV Antigen/Antibody Combo Nonreactive Nonreactive   Magnesium Level   Result Value Ref Range    Magnesium 1.9 1.6 - 2.2 mg/dL   Phosphorus Level   Result Value Ref Range    Phosphorus 3.1 2.9 - 4.7 mg/dL   Comprehensive Metabolic Panel   Result Value Ref Range    Sodium 152 (H) 135 - 145 mmol/L    Potassium 3.7 3.5 - 5.0 mmol/L    Chloride 117 (H) 98 - 107 mmol/L    Anion Gap 10 7 - 15 mmol/L    CO2 25.0 22.0 - 30.0 mmol/L    BUN 39 (H) 7 - 21 mg/dL    Creatinine 0.96 0.45 - 1.00 mg/dL    BUN/Creatinine Ratio 47     EGFR CKD-EPI Non-African American, Female 63 >=60 mL/min/1.53m2    EGFR CKD-EPI African American, Female 77 >=60 mL/min/1.81m2    Glucose 98 70 - 179 mg/dL    Calcium 8.6 8.5 - 40.9 mg/dL    Albumin 2.8 (L) 3.5 - 5.0 g/dL    Total Protein 5.2 (L) 6.5 - 8.3 g/dL    Total Bilirubin 0.5 0.0 - 1.2 mg/dL    AST 46 (H) 14 - 38 U/L    ALT 44 (H) <35 U/L    Alkaline Phosphatase 86 38 - 126 U/L   Magnesium Level   Result Value Ref Range    Magnesium 2.0 1.6 - 2.2 mg/dL   Phosphorus Level   Result Value Ref Range    Phosphorus 2.9 2.9 - 4.7 mg/dL   Sodium   Result Value Ref Range    Sodium 142 135 - 145 mmol/L   Comprehensive Metabolic Panel   Result Value Ref Range    Sodium 145 135 - 145 mmol/L    Potassium 4.1 3.5 - 5.0 mmol/L    Chloride 114 (H) 98 - 107 mmol/L    Anion Gap 10 7 - 15 mmol/L    CO2 21.0 (L) 22.0 - 30.0 mmol/L    BUN 38 (H) 7 - 21 mg/dL    Creatinine 8.11 9.14 - 1.00 mg/dL    BUN/Creatinine Ratio 48     EGFR CKD-EPI Non-African American, Female 67 >=60 mL/min/1.31m2    EGFR CKD-EPI African American, Female 105 >=60 mL/min/1.57m2    Glucose 81 70 - 179 mg/dL    Calcium 8.6 8.5 - 78.2 mg/dL    Albumin 3.0 (L) 3.5 - 5.0 g/dL    Total Protein 5.5 (L) 6.5 - 8.3 g/dL    Total Bilirubin 0.8 0.0 - 1.2 mg/dL    AST 57 (H) 14 - 38 U/L    ALT 43 (H) <35 U/L    Alkaline Phosphatase 82 38 - 126 U/L   Magnesium Level   Result Value Ref Range    Magnesium 1.9 1.6 -  2.2 mg/dL   Phosphorus Level   Result Value Ref Range    Phosphorus 2.7 (L) 2.9 - 4.7 mg/dL   CBC   Result Value Ref Range    WBC 7.9 3.5 - 10.5 10*9/L    RBC 2.45 (L) 3.90 - 5.03 10*12/L    HGB 8.5 (L) 12.0 - 15.5 g/dL    HCT 16.1 (L) 09.6 - 44.0 %    MCV 108.3 (H) 82.0 - 98.0 fL    MCH 34.9 (H) 26.0 - 34.0 pg    MCHC 32.2 30.0 - 36.0 g/dL    RDW 04.5 (H) 40.9 - 15.0 %    MPV 9.9 7.0 - 10.0 fL    Platelet 287 150 - 450 10*9/L    nRBC 4 <=4 /100 WBCs   Sodium   Result Value Ref Range    Sodium 145 135 - 145 mmol/L   Comprehensive Metabolic Panel   Result Value Ref Range    Sodium 142 135 - 145 mmol/L    Potassium 3.8 3.5 - 5.0 mmol/L    Chloride 111 (H) 98 - 107 mmol/L    Anion Gap 6 (L) 7 - 15 mmol/L    CO2 25.0 22.0 - 30.0 mmol/L    BUN 36 (H) 7 - 21 mg/dL    Creatinine 8.11 9.14 - 1.00 mg/dL    BUN/Creatinine Ratio 51     EGFR CKD-EPI Non-African American, Female 76 >=60 mL/min/1.52m2    EGFR CKD-EPI African American, Female 90 >=60 mL/min/1.65m2    Glucose 78 70 - 179 mg/dL    Calcium 8.2 (L) 8.5 - 10.2 mg/dL    Albumin 2.4 (L) 3.5 - 5.0 g/dL    Total Protein 4.7 (L) 6.5 - 8.3 g/dL    Total Bilirubin 0.5 0.0 - 1.2 mg/dL    AST 46 (H) 14 - 38 U/L    ALT 38 (H) <35 U/L    Alkaline Phosphatase 68 38 - 126 U/L   Magnesium Level   Result Value Ref Range    Magnesium 1.8 1.6 - 2.2 mg/dL   Phosphorus Level   Result Value Ref Range    Phosphorus 2.9 2.9 - 4.7 mg/dL   ECG 12 Lead   Result Value Ref Range    EKG Systolic BP  mmHg    EKG Diastolic BP  mmHg    EKG Ventricular Rate 91 BPM    EKG Atrial Rate 91 BPM    EKG P-R Interval 148 ms    EKG QRS Duration 68 ms    EKG Q-T Interval 358 ms    EKG QTC Calculation 440 ms    EKG Calculated P Axis 66 degrees    EKG Calculated R Axis -2 degrees    EKG Calculated T Axis 35 degrees    QTC Fredericia 411 ms   POCT Glucose   Result Value Ref Range    Glucose, POC 121 70 - 179 mg/dL   CBC w/ Differential   Result Value Ref Range    WBC 7.7 4.5 - 11.0 10*9/L    RBC 2.88 (L) 4.00 - 5.20 10*12/L    HGB 10.1 (L) 12.0 - 16.0 g/dL    HCT 78.2 (L) 95.6 - 46.0 %    MCV 111.8 (H) 80.0 - 100.0 fL    MCH 35.0 (H) 26.0 - 34.0 pg    MCHC 31.3 31.0 - 37.0 g/dL    RDW 21.3 (H) 08.6 - 15.0 %    MPV 10.5 (H)  7.0 - 10.0 fL    Platelet 456 (H) 150 - 440 10*9/L    nRBC 6 (H) <=4 /100 WBCs    Neutrophils % 57.5 %    Lymphocytes % 22.7 %    Monocytes % 13.6 %    Eosinophils % 0.9 %    Basophils % 1.3 %    Absolute Neutrophils 4.4 2.0 - 7.5 10*9/L    Absolute Lymphocytes 1.8 1.5 - 5.0 10*9/L    Absolute Monocytes 1.1 (H) 0.2 - 0.8 10*9/L    Absolute Eosinophils 0.1 0.0 - 0.4 10*9/L    Absolute Basophils 0.1 0.0 - 0.1 10*9/L    Large Unstained Cells 4 0 - 4 %    Microcytosis Slight (A) Not Present    Macrocytosis Marked (A) Not Present    Anisocytosis Marked (A) Not Present    Hypochromasia Marked (A) Not Present   Morphology Review   Result Value Ref Range    Smear Review Comments See Comment (A) Undefined    Ovalocytes Moderate (A) Not Present    Tear Drop Cells Moderate (A) Not Present    Burr Cells Present (A) Not Present    Poikilocytosis Moderate (A) Not Present   Morphology Review   Result Value Ref Range    Smear Review Comments See Comment (A) Undefined    Polychromasia Slight (A) Not Present    Basophilic Stippling Present (A) Not Present

## 2019-06-01 DIAGNOSIS — R410 Disorientation, unspecified: Principal | ICD-10-CM

## 2019-06-02 NOTE — Unmapped (Signed)
I spoke with patient Hayley Wagner to confirm appointments on the following date(s): 4/14 LP in fluoro    [name]

## 2019-06-03 NOTE — Unmapped (Signed)
CONTINUING CARE NETWORK  SNF DISCHARGE NOTE  Summary:  Hayley Wagner is an 86 y.o. female patient that discharged from [facility] on 06/02/2019 who was being followed by the [facility].     Discharge disposition is transition to ALF.    Subjective:   Discharge summary and instructions uploaded to EPIC: No    Medication changes:  See home medications.    Care team member, Darel Hong, reported patient discharged.      Progress toward goals:  Goals    None          Barriers to care: Transportation  Falls risk  Care Coordination Note updated in [facility]: Yes    Objective:     Patient Active Problem List   Diagnosis   ??? 2-vessel coronary artery disease   ??? Thoracoabdominal aneurysm (CMS-HCC)   ??? Chest pain, unspecified   ??? Chronic kidney disease, stage 3   ??? Descending thoracic aortic aneurysm (CMS-HCC)   ??? Ectatic thoracic aorta (CMS-HCC)   ??? Essential hypertension   ??? Hyperlipidemia   ??? IGT (impaired glucose tolerance)   ??? Increased frequency of urination   ??? Laxity of skin   ??? Palpitations   ??? Rectocele, female   ??? Rosacea   ??? Primary myelofibrosis (CMS-HCC)   ??? Altered mental status   ??? AKI (acute kidney injury) (CMS-HCC)   ??? UTI (urinary tract infection)   ??? Fall   ??? Shingles   ??? GERD (gastroesophageal reflux disease)   ??? Elevated troponin        Allergies:   No Known Allergies    Medications:  Current Outpatient Medications   Medication Sig Dispense Refill   ??? acetaminophen (TYLENOL) 500 MG tablet Take 1,000 mg by mouth every eight (8) hours.     ??? ascorbic acid, vitamin C, (VITAMIN C) 250 MG tablet Take 250 mg by mouth daily.     ??? aspirin 81 MG chewable tablet Chew 81 mg daily.      ??? betamethasone valerate (VALISONE) 0.1 % lotion Apply 1 application topically Two (2) times a day.      ??? carboxymethylcellulose sodium (THERATEARS) 0.25 % Drop Administer 1 drop to both eyes Six (6) times a day.  0   ??? cholecalciferol, vitamin D3-50 mcg, 2,000 unit,, 50 mcg (2,000 unit) tablet Take 1 tablet by mouth daily.     ??? DULoxetine (CYMBALTA) 20 MG capsule Take 1 capsule (20 mg total) by mouth daily.  0   ??? lidocaine (LMX) 4 % cream Apply topically Three (3) times a day as needed (Apply to face for face pain). 30 g 0   ??? lidocaine 2% viscous (XYLOCAINE) 2 % Soln 15 mL by Mouth route every four (4) hours as needed (for mouth pain prior to eating). 1 Bottle 0   ??? melatonin 3 mg Tab Take 2 tablets (6 mg total) by mouth every evening. At 6pm  0   ??? multivitamin (TAB-A-VITE/THERAGRAN) per tablet Take 1 tablet by mouth daily.      ??? polyethylene glycol (MIRALAX) 17 gram packet Take 17 g by mouth daily.     ??? pregabalin (LYRICA) 50 MG capsule Take 1 capsule (50 mg total) by mouth nightly.  0   ??? RESTASIS MULTIDOSE 0.05 % Drop Administer 1 drop to both eyes nightly as needed.      ??? senna (SENOKOT) 8.6 mg tablet Take 2 tablets by mouth nightly as needed for constipation.  0   ???  simvastatin (ZOCOR) 20 MG tablet TAKE 1 TABLET BY MOUTH ONCE DAILY     ??? valACYclovir (VALTREX) 500 MG tablet Take 1 tablet (500 mg total) by mouth daily.  0     No current facility-administered medications for this visit.         Future Appointments   Date Time Provider Department Center   06/07/2019  9:30 AM [facility] RM 9 IFLU[facility] [place]   06/27/2019  1:00 PM Rebecca Knowlton Sawchak, AGNP [id number] [place]

## 2019-06-07 ENCOUNTER — Ambulatory Visit
Admit: 2019-06-07 | Discharge: 2019-06-09 | Disposition: A | Discharge status: Skilled Nursing Facility | Payer: MEDICARE | Source: Ambulatory Visit

## 2019-06-07 LAB — COMPREHENSIVE METABOLIC PANEL
ALBUMIN: 3.2 g/dL — ABNORMAL LOW (ref 3.5–5.0)
ALKALINE PHOSPHATASE: 96 U/L (ref 38–126)
ALT (SGPT): 15 U/L (ref <35)
ANION GAP: 10 mmol/L (ref 7–15)
AST (SGOT): 50 U/L — ABNORMAL HIGH (ref 14–38)
BILIRUBIN TOTAL: 0.6 mg/dL (ref 0.0–1.2)
BLOOD UREA NITROGEN: 20 mg/dL (ref 7–21)
BUN / CREAT RATIO: 27
CALCIUM: 8.3 mg/dL — ABNORMAL LOW (ref 8.5–10.2)
CHLORIDE: 104 mmol/L (ref 98–107)
CO2: 23 mmol/L (ref 22.0–30.0)
CREATININE: 0.74 mg/dL (ref 0.60–1.00)
EGFR CKD-EPI AA FEMALE: 84 mL/min/{1.73_m2} (ref >=60)
GLUCOSE RANDOM: 93 mg/dL (ref 70–179)
POTASSIUM: 5.1 mmol/L — ABNORMAL HIGH (ref 3.5–5.0)
PROTEIN TOTAL: 5.9 g/dL — ABNORMAL LOW (ref 6.5–8.3)

## 2019-06-07 LAB — CBC W/ AUTO DIFF
BASOPHILS ABSOLUTE COUNT: 0.1 10*9/L (ref 0.0–0.1)
BASOPHILS RELATIVE PERCENT: 0.8 %
EOSINOPHILS ABSOLUTE COUNT: 0.1 10*9/L (ref 0.0–0.4)
EOSINOPHILS RELATIVE PERCENT: 1.4 %
HEMATOCRIT: 31.2 % — ABNORMAL LOW (ref 36.0–46.0)
HEMOGLOBIN: 9.2 g/dL — ABNORMAL LOW (ref 12.0–16.0)
LARGE UNSTAINED CELLS: 4 % (ref 0–4)
LYMPHOCYTES ABSOLUTE COUNT: 2.1 10*9/L (ref 1.5–5.0)
MEAN CORPUSCULAR HEMOGLOBIN CONC: 29.5 g/dL — ABNORMAL LOW (ref 31.0–37.0)
MEAN CORPUSCULAR HEMOGLOBIN: 36.1 pg — ABNORMAL HIGH (ref 26.0–34.0)
MEAN CORPUSCULAR VOLUME: 122.3 fL — ABNORMAL HIGH (ref 80.0–100.0)
MEAN PLATELET VOLUME: 10.9 fL — ABNORMAL HIGH (ref 7.0–10.0)
MONOCYTES ABSOLUTE COUNT: 0.7 10*9/L (ref 0.2–0.8)
MONOCYTES RELATIVE PERCENT: 8.2 %
NEUTROPHILS ABSOLUTE COUNT: 5.4 10*9/L (ref 2.0–7.5)
NEUTROPHILS RELATIVE PERCENT: 61.6 %
NUCLEATED RED BLOOD CELLS: 1 /100{WBCs} (ref <=4)
RED CELL DISTRIBUTION WIDTH: 22.8 % — ABNORMAL HIGH (ref 12.0–15.0)
WBC ADJUSTED: 8.8 10*9/L (ref 4.5–11.0)

## 2019-06-07 LAB — BLOOD GAS, VENOUS
BASE EXCESS VENOUS: 1.1 (ref -2.0–2.0)
O2 SATURATION VENOUS: 48.6 % (ref 40.0–85.0)
PCO2 VENOUS: 51 mmHg (ref 40–60)
PO2 VENOUS: 30 mmHg (ref 30–55)

## 2019-06-07 LAB — MAGNESIUM: Magnesium:MCnc:Pt:Ser/Plas:Qn:: 1.8

## 2019-06-07 LAB — URINALYSIS WITH CULTURE REFLEX
BACTERIA: NONE SEEN /HPF
BILIRUBIN UA: NEGATIVE
GLUCOSE UA: NEGATIVE
KETONES UA: NEGATIVE
NITRITE UA: NEGATIVE
PH UA: 6 (ref 5.0–9.0)
RBC UA: 7 /HPF — ABNORMAL HIGH (ref <=4)
SPECIFIC GRAVITY UA: 1.01 (ref 1.003–1.030)
SQUAMOUS EPITHELIAL: <1 /HPF (ref 0–5)
UROBILINOGEN UA: 0.2
WBC UA: >182 /HPF — ABNORMAL HIGH (ref 0–5)

## 2019-06-07 LAB — AST (SGOT): Aspartate aminotransferase:CCnc:Pt:Ser/Plas:Qn:: 50 — ABNORMAL HIGH

## 2019-06-07 LAB — O2 SATURATION VENOUS: Oxygen saturation:MFr:Pt:BldV:Qn:: 48.6

## 2019-06-07 LAB — SLIDE REVIEW

## 2019-06-07 LAB — EOSINOPHILS ABSOLUTE COUNT: Eosinophils:NCnc:Pt:Bld:Qn:Automated count: 0.1

## 2019-06-07 LAB — POLYCHROMASIA

## 2019-06-07 LAB — SQUAMOUS EPITHELIAL: Lab: <1

## 2019-06-07 NOTE — Unmapped (Addendum)
Brought from radiology due to acute altered mental status. Hx of vascular dementia with a quick decline in mental status in the last month. Pt was able to speak this morning per daughter in law, now not responsive to RN's speech.

## 2019-06-07 NOTE — Unmapped (Signed)
Patient assistance to changed clean blue sheet. Putted pure wick and white pad with and Patient changed hospital gown and paced cardiac monitor with continuous pulse oximeter to patient.  Patient daughter -in-law at the bedside with patient.

## 2019-06-07 NOTE — Unmapped (Signed)
[facility]  Emergency Department Provider Note      ED Clinical Impression     Final diagnoses:   Altered mental status, unspecified altered mental status type (Primary)       Initial Impression, ED Course, Assessment and Plan     Impression: Ms. Savard is an 86 yo woman with PMHx myelofibrosis, vascular dementia, HTN, CKD, CAD, AAA, and multiple recent admissions for AMS who presents today with AMS.  Altered mental status is acute on chronic with slow decline in mental status for 2 months followed by acute worsening of mental status noted this morning.  Per daughter-in-law report, patient was speaking normally and feeding herself 5 days prior to presentation and another family member first noted that she was off 3 days prior to presentation.  Patient is somnolent, with contractured limbs in fetal position. Afebrile with vital signs within normal limits.  Lungs clear and no abdominal tenderness on exam.  Low suspicion for acute stroke given lack of focal neurological deficits on exam and subacute presentation.  Plan for further work-up of AMS including CT head, chest x-ray, UA, BC, CMP, VBG.    2:10 PM - VBG with pH 7.34, HCO3 28.  CBC with Hgb 9.2, Hct31.2, Plt 557, overall similar to prior labs given myelofibrosis.     4:39 PM - CXR w/out airspace abnormality. Unable to obtain UA secondary to pt voiding around catheter. Plan for 1L NS then re-attempt UA.    5:52 PM - CT head without acute intracranial abnormality.  UA with large LE, negative nitrites, greater than 182 WBCs, and < 1 squam, c/f UTI. Prior Cx have grown enterococcus and pseudomonas resistant to doxycycline and levofloxacin. Plan to give ceftriaxone 1g IV x1. Paged MAO for admission.    7:22 PM - Spoke to admitting team. Confirmed DNR/DNI status with family. Daughter-in-law is available at bedside.      Additional Medical Decision Making     I have reviewed the vital signs and the nursing notes. Labs and radiology results that were available during my care of the patient were independently reviewed by me and considered in my medical decision making.     I staffed the case with the ED attending, Dr. Alvino Chapel.    I independently visualized the radiology images.   I reviewed the patient's prior medical records.   I discussed the case with the Neurology consultant.   I discussed the case with the admitting provider.   Additional history obtained from patient's daughter-in-law.      Portions of this record have been created using Scientist, clinical (histocompatibility and immunogenetics). Dictation errors have been sought, but may not have been identified and corrected.  ____________________________________________       History     Chief Complaint  Altered Mental Status      HPI   STEFAN KAREN is a 86 y.o. female myelofibrosis, bone marrow cancer, vascular dementia, AAA, and postherpetic neuralgia presents with altered mental status.    Patient has had a slow decline in her mental status since February.  However when daughter-in-law saw her this morning she seemed to have an acute change.  She was not speaking at all and not alert.  He was barely arousable.  Daughter-in-law noted that her left eye seemed to veer off to the side and that her eyes did not seem to work together.     She was at her PCPs office this morning for an outpatient LP.  She was not arousable during  the LP, despite not being on any medication for pain or sedation.  They were not able to get LP after multiple attempts due to scoliosis and compressed vertebrae.    Per daughter-in-law, 5 days ago she was talking normally, playing the piano, and feeding herself.  In February she was living independently and was independent of all ADLs and IADLs but then had a severe decline in mental status that was thought due to a UTI with delirium.  She has been in the memory care unit since then.  Also has a recent history of shingles, for which she still has lingering right-sided facial pain.  No recent fevers, chest pain, abdominal pain, nausea, vomiting, diarrhea, polyuria, or foul-smelling urine. However, does not live with patient and has not gotten into contact with memory care unit to confirm no recent illnesses.      Past Medical History:   Diagnosis Date   ??? Anemia    ??? At risk for falls    ??? CAD (coronary artery disease)    ??? Cancer (CMS-HCC)    ??? Chronic kidney disease    ??? GERD (gastroesophageal reflux disease)    ??? Hyperlipidemia    ??? Hypertension        Patient Active Problem List   Diagnosis   ??? 2-vessel coronary artery disease   ??? Thoracoabdominal aneurysm (CMS-HCC)   ??? Chest pain, unspecified   ??? Chronic kidney disease, stage 3   ??? Descending thoracic aortic aneurysm (CMS-HCC)   ??? Ectatic thoracic aorta (CMS-HCC)   ??? Essential hypertension   ??? Hyperlipidemia   ??? IGT (impaired glucose tolerance)   ??? Increased frequency of urination   ??? Laxity of skin   ??? Palpitations   ??? Rectocele, female   ??? Rosacea   ??? Primary myelofibrosis (CMS-HCC)   ??? Altered mental status   ??? AKI (acute kidney injury) (CMS-HCC)   ??? UTI (urinary tract infection)   ??? Fall   ??? Shingles   ??? GERD (gastroesophageal reflux disease)   ??? Elevated troponin       No past surgical history on file.    No current facility-administered medications for this encounter.    Current Outpatient Medications:   ???  acetaminophen (TYLENOL) 500 MG tablet, Take 1,000 mg by mouth every eight (8) hours., Disp: , Rfl:   ???  ascorbic acid, vitamin C, (VITAMIN C) 250 MG tablet, Take 250 mg by mouth daily., Disp: , Rfl:   ???  aspirin 81 MG chewable tablet, Chew 81 mg daily. , Disp: , Rfl:   ???  betamethasone valerate (VALISONE) 0.1 % lotion, Apply 1 application topically Two (2) times a day. , Disp: , Rfl:   ???  carboxymethylcellulose sodium (THERATEARS) 0.25 % Drop, Administer 1 drop to both eyes Six (6) times a day., Disp: , Rfl: 0  ???  cholecalciferol, vitamin D3-50 mcg, 2,000 unit,, 50 mcg (2,000 unit) tablet, Take 1 tablet by mouth daily., Disp: , Rfl:   ???  DULoxetine (CYMBALTA) 20 MG capsule, Take 1 capsule (20 mg total) by mouth daily., Disp: , Rfl: 0  ???  lidocaine (LMX) 4 % cream, Apply topically Three (3) times a day as needed (Apply to face for face pain)., Disp: 30 g, Rfl: 0  ???  lidocaine 2% viscous (XYLOCAINE) 2 % Soln, 15 mL by Mouth route every four (4) hours as needed (for mouth pain prior to eating)., Disp: 1 Bottle, Rfl: 0  ???  melatonin 3 mg Tab,  Take 2 tablets (6 mg total) by mouth every evening. At 6pm, Disp: , Rfl: 0  ???  multivitamin (TAB-A-VITE/THERAGRAN) per tablet, Take 1 tablet by mouth daily. , Disp: , Rfl:   ???  polyethylene glycol (MIRALAX) 17 gram packet, Take 17 g by mouth daily., Disp: , Rfl:   ???  pregabalin (LYRICA) 50 MG capsule, Take 1 capsule (50 mg total) by mouth nightly., Disp: , Rfl: 0  ???  RESTASIS MULTIDOSE 0.05 % Drop, Administer 1 drop to both eyes nightly as needed. , Disp: , Rfl:   ???  senna (SENOKOT) 8.6 mg tablet, Take 2 tablets by mouth nightly as needed for constipation., Disp: , Rfl: 0  ???  simvastatin (ZOCOR) 20 MG tablet, TAKE 1 TABLET BY MOUTH ONCE DAILY, Disp: , Rfl:   ???  valACYclovir (VALTREX) 500 MG tablet, Take 1 tablet (500 mg total) by mouth daily., Disp: , Rfl: 0    Allergies  Patient has no known allergies.    No family history on file.    Social History  Social History     Tobacco Use   ??? Smoking status: Former Smoker   ??? Smokeless tobacco: Never Used   Vaping Use   ??? Vaping Use: Never used   Substance Use Topics   ??? Alcohol use: Yes   ??? Drug use: Never       Review of Systems  **Limited due to AMS**  Constitutional: Negative for fever.  Eyes: Negative for visual changes.  ENT: Negative for sore throat.  Cardiovascular: Negative for chest pain.  Respiratory: Negative for shortness of breath.  Gastrointestinal: Negative for abdominal pain, vomiting or diarrhea.  Genitourinary: Negative for dysuria.   Musculoskeletal: Negative for back pain.  Skin: Negative for rash.  Neurological: As in HPI.    Physical Exam     ED Triage Vitals [06/07/19 1303]   Enc Vitals Group      BP 103/67      Heart Rate 71      SpO2 Pulse       Resp 14      Temp 35.7 ??C (96.2 ??F)      Temp Source Temporal      SpO2 100 %      Weight       Height       Head Circumference       Peak Flow       Pain Score       Pain Loc       Pain Edu?       Excl. in GC?        Constitutional: Tonically ill-appearing woman lying in fetal position on stretcher.  Eyes closed.  Difficult to arouse.  Limbs clenched toward body, and difficult to straighten.  Eyes: Conjunctivae are normal. PERRL. Pupils midline.   ENT       Head: Normocephalic and atraumatic.       Nose: No congestion.       Mouth/Throat: Mucous membranes are moist.       Neck: No stridor.  Cardiovascular: Normal rate, regular rhythm. Normal and symmetric distal pulses are present in all extremities.  Respiratory: Normal respiratory effort. Breath sounds are normal.  Gastrointestinal: Soft and nontender. There is no CVA tenderness.  Musculoskeletal: Limbs contractured. No peripheral edema or tenderness.  Neurologic: Nonverbal. Difficult to arouse but patient responds to manipulation. No focal deficits identified.   Skin: Skin is warm, dry and intact. No rash noted.  Psychiatric:  Nonverbal.      EKG     None    Radiology     CXR: No acute airspace disease allowing for limited study.    CT head Wo Contrast: No acute intracranial abnormality.      Procedures     None.             Cathleen Corti, MD  Resident  06/07/19 [phone]

## 2019-06-08 NOTE — Unmapped (Signed)
Vancomycin Therapeutic Monitoring Pharmacy Note    Michie Molnar is a 86 y.o. female starting vancomycin. Date of therapy initiation: 06/07/19    Indication: Urinary Tract Infection (UTI)    Prior Dosing Information: None/new initiation     Goals:  Therapeutic Drug Levels  Vancomycin trough goal: 15-20 mg/L    Additional Clinical Monitoring/Outcomes  Renal function, volume status (intake and output)    Results: Not applicable    Wt Readings from Last 1 Encounters:   05/29/19 48.7 kg (107 lb 4.8 oz)     Creatinine   Date Value Ref Range Status   06/07/2019 0.74 0.60 - 1.00 mg/dL Final   16/11/9602 5.40 0.60 - 1.00 mg/dL Final   98/12/9145 8.29 0.60 - 1.00 mg/dL Final        Pharmacokinetic Considerations and Significant Drug Interactions:  ??? Adult (estimated initial): Vd = 33.37 L, ke = 0.054 hr-1  ??? Concurrent nephrotoxic meds: not applicable    Assessment/Plan:  Recommendation(s)  ??? Start vancomycin 1250mg  q24h  ??? Estimated trough on recommended regimen: 15 mg/L    Follow-up  ??? Level due: prior to fourth or fifth dose  ??? A pharmacist will continue to monitor and order levels as appropriate    Please page service pharmacist with questions/clarifications.    Maryln Manuel, PharmD

## 2019-06-08 NOTE — Unmapped (Signed)
MRI Screening Form needs to be completed, please fill out all questions. If pt is non-verbal, please contact immediate family member to help fill out questions and list them and their number in the screening form. Pt must be changed into a gown and all medication patches must be removed. If you have any questions, please call MRI at [phone]. Thank you.

## 2019-06-08 NOTE — Unmapped (Signed)
Speech Language Pathology Clinical Swallow Assessment  Evaluation (06/08/19 1035)    Patient Name:  Hayley Wagner       Medical Record Number: [id number]   Date of Birth: 01-Jan-1929  Sex: Female            SLP Treatment Diagnosis: r/o dysphagia  Activity Tolerance: Patient tolerated treatment well    Assessment  Pt. seen for clinical swallow evaluation. Pt. currently on a regular diet w/ thin liquids. Per sitter report, Pt. had oatmeal, bacon, and coffee for breakfast w/ no concerns for aspiration. Pt. presents w/ grossly functional swallow across p.o. consistencies w/ no overt s/sx of aspiration, despite thorough challenging. Slightly prolonged mastication in the setting of edentulous state. Per Pt. report, she has dentures but they are not at the hospital. ? reliability as Pt. is a poor historian. RN present for portion of evaluation and observed Pt. taking meds crushed in puree (pudding). Recommend regular consistency solids w/ thin liquids and meds crushed in puree. Recommend self selecting softer solids given edentulous state. Recommend full supervision and assist w/ meals. Aspiration precautions: sit upright, slow rate, small bites/sips, alternate solids/liquids. Given Pt.???s baseline of dementia and that she is a resident in a memory care unit, SLP services not indicated unless there is a notable variance from baseline. SLP will s/o at this time. Please reconsult w/ acute changes. Pt. may benefit from SLP evaluation and treatment in functional living environment if variance from baseline is noted from family of staff.  Risk for Aspiration: Mild     Recommendations:         Diet Liquids Recommendations: No Restrictions    Diet Solids Recommendation: No Restrictions    Recommended Form of Medications: Crushed, With puree      Compensatory Swallowing Strategies: Upright as possible for all oral intake, Alternate solids and liquids, Small bites/sips, Eat/feed slowly    Post Acute Discharge Recommendations Post Acute SLP Discharge Recommendations: SLP services not indicated    Prognosis: Good  Positive Indicators: current objective findings, Pt. resides in memory care unit at baseline        Plan of Care  SLP Follow-up / Frequency: D/C Services, D/C Services      Treatment Goals:    Patient and Family Goal: Pt. has no communicated goals.    Subjective    Allergies: Patient has no known allergies.  Current Facility-Administered Medications   Medication Dose Route Frequency Provider Last Rate Last Admin   ??? acetaminophen (TYLENOL) tablet 1,000 mg  1,000 mg Oral Q8H Sallyanne Havers, MD       ??? aluminum-magnesium hydroxide-simethicone (MAALOX MAX) 80-80-8 mg/mL oral suspension  30 mL Oral Q4H PRN Sallyanne Havers, MD       ??? ascorbic acid (vitamin C) (VITAMIN C) tablet 250 mg  250 mg Oral Daily Sallyanne Havers, MD       ??? aspirin chewable tablet 81 mg  81 mg Oral Daily Sallyanne Havers, MD       ??? carboxymethylcellulose sodium (THERATEARS) 0.25 % ophthalmic solution 1 drop  1 drop Both Eyes Nightly PRN Sallyanne Havers, MD       ??? cholecalciferol (vitamin D3 25 mcg (1,000 units)) tablet 50 mcg  50 mcg Oral Daily Sallyanne Havers, MD       ??? DULoxetine (CYMBALTA) DR capsule 20 mg  20 mg Oral Daily Sallyanne Havers, MD   20 mg at 06/08/19 1040   ??? enoxaparin (LOVENOX) syringe 30 mg  30 mg Subcutaneous Q24H Beverley Fiedler  Rojek, MD       ??? lidocaine (LMX) 4 % cream   Topical TID PRN Sallyanne Havers, MD       ??? lidocaine (XYLOCAINE) 2% viscous mucosal solution  15 mL Mouth Q4H PRN Sallyanne Havers, MD       ??? melatonin tablet 6 mg  6 mg Oral QPM Sallyanne Havers, MD   6 mg at 06/08/19 0005   ??? multivitamins, therapeutic with minerals tablet 1 tablet  1 tablet Oral Daily Sallyanne Havers, MD       ??? polyethylene glycol (MIRALAX) packet 17 g  17 g Oral Daily Sallyanne Havers, MD       ??? pravastatin (PRAVACHOL) tablet 40 mg  40 mg Oral Nightly Sallyanne Havers, MD       ??? pregabalin (LYRICA) capsule 75 mg  75 mg Oral Nightly Sallyanne Havers, MD   75 mg at 06/08/19 0005   ??? senna (SENOKOT) tablet 2 tablet  2 tablet Oral Nightly Sallyanne Havers, MD       ??? valACYclovir (VALTREX) tablet 500 mg  500 mg Oral Daily Sallyanne Havers, MD   500 mg at 06/08/19 1040     Current Outpatient Medications   Medication Sig Dispense Refill   ??? aluminum-magnesium hydroxide-simethicone (MAALOX MAX) 400-400-40 mg/5 mL suspension Take 30 mL by mouth every four (4) hours as needed (indigestion).     ??? carBAMazepine (TEGRETOL) 100 mg chewable tablet Chew 200 mg Two (2) times a day.     ??? melatonin 3 mg Tab Take 3 mg by mouth nightly.     ??? pregabalin (LYRICA) 75 MG capsule Take 75 mg by mouth nightly.     ??? acetaminophen (TYLENOL) 500 MG tablet Take 1,000 mg by mouth every eight (8) hours.     ??? ascorbic acid, vitamin C, (VITAMIN C) 250 MG tablet Take 250 mg by mouth daily.     ??? aspirin 81 MG chewable tablet Chew 81 mg daily.      ??? betamethasone valerate (VALISONE) 0.1 % lotion Apply 1 application topically Two (2) times a day.      ??? carboxymethylcellulose sodium (THERATEARS) 0.25 % Drop Administer 1 drop to both eyes Six (6) times a day.  0   ??? cholecalciferol, vitamin D3-50 mcg, 2,000 unit,, 50 mcg (2,000 unit) tablet Take 1 tablet by mouth daily.     ??? DULoxetine (CYMBALTA) 20 MG capsule Take 1 capsule (20 mg total) by mouth daily.  0   ??? lidocaine (LMX) 4 % cream Apply topically Three (3) times a day as needed (Apply to face for face pain). 30 g 0   ??? lidocaine 2% viscous (XYLOCAINE) 2 % Soln 15 mL by Mouth route every four (4) hours as needed (for mouth pain prior to eating). 1 Bottle 0   ??? multivitamin (TAB-A-VITE/THERAGRAN) per tablet Take 1 tablet by mouth daily.      ??? polyethylene glycol (MIRALAX) 17 gram packet Take 17 g by mouth daily.     ??? pregabalin (LYRICA) 50 MG capsule Take 1 capsule (50 mg total) by mouth nightly.  0   ??? RESTASIS MULTIDOSE 0.05 % Drop Administer 1 drop to both eyes nightly as needed.      ??? senna (SENOKOT) 8.6 mg tablet Take 2 tablets by mouth nightly as needed for constipation.  0   ??? simvastatin (ZOCOR) 20 MG tablet Take 20 mg by mouth nightly.      ??? valACYclovir (VALTREX) 500 MG tablet Take 1  tablet (500 mg total) by mouth daily.  0     Past Medical History:   Diagnosis Date   ??? Anemia    ??? At risk for falls    ??? CAD (coronary artery disease)    ??? Cancer (CMS-HCC)    ??? Chronic kidney disease    ??? GERD (gastroesophageal reflux disease)    ??? Hyperlipidemia    ??? Hypertension      No family history on file.  No past surgical history on file.  Social History     Tobacco Use   ??? Smoking status: Former Smoker   ??? Smokeless tobacco: Never Used   Substance Use Topics   ??? Alcohol use: Yes         General:  SLP Treatment Diagnosis: r/o dysphagia  Current Functional Status: 86 y.o. female with hx of myelofibrosis not on current tx, postherpetic neuralgia, multiple recent admissions for AMS that presented to [facility] with acute on chronic altered mental status. Acute on chronic altered mental status. 3-4 day hx of mental status change from most recent baseline. Infectious work-up suggests possibility of UTI, though patient without subjective symptoms, fever, or leukocytosis. Pt. currently on regular diet.  Activity Tolerance: Patient tolerated treatment well  Communication Preference: Verbal  Medical Tests / Procedures Comments: Head CT 4/14: No acute intracranial abnormality. CXR 4/14: No acute airspace disease allowing for limited study.  Equipment/Environment: Patient not wearing mask for full session, Other (SLP and SLP student in mask and eye protection for entire session.)  Patient/Caregiver Reports: Pt. reports I love ice cream and I have dentures but I lost them (note: reliability as a historian is poor)  Pain: no s/s of pain                    Vision: Functional for self-feeding                   Precautions: Falls precautions, Aspiration precautions    Prior Function: Required assistance to manage finances, Required assistance to manage medications, Did not drive prior to admission      Objective  Temperature Spikes Noted: No  Respiratory Status : Room air  History of Intubation: No     Behavior/Cognition: Alert, Cooperative, Confused, Pleasant mood  Positioning : Upright in bed    Oral / Motor Exam  Vocal Quality: Normal  Volitional Swallow: Within Functional Limits   Labial ROM: Within Functional Limits   Labial Symmetry: Within Functional Limits  Labial Strength: Within Functional Limits   Lingual ROM: Within Functional Limits  Lingual Symmetry: Within Functional Limits  Lingual Strength: Within Functional Limits      Velum: Within Functional Limits   Mandible: Within Functional Limits  Coordination: WFL  Facial ROM: Within Functional Limits   Facial Symmetry: Within Functional Limits  Facial Strength: Within Functional Limits  Facial Sensation: Within Functional Limits   Vocal Intensity: Within Functional Limits       Apraxia: None present   Dysarthria: None present   Intelligibility: Intelligible   Breath Support: Adequate for speech   Dentition: Edentulous    Consistencies assessed: thin liquid via straw sip and cup sip, pureed solids via spoon, advanced solids    Medical Staff Made Aware: RN, NA    Speech Therapy Session Duration  SLP Individual - Duration: 24     Loney Loh, Gaffer Clinician  The care for this patient was completed by Susa Griffins, CCC-SLP:  A student was present  and participated in the care. Licensed/Credentialed therapist was physically present and immediately available to direct and supervise tasks that were related to patient management. The direction and supervision was continuous throughout the time these tasks were performed.    Bianco Cange B Arbie Blankley, CCC-SLP    I attest that I have reviewed the above information.  Signed: Susa Griffins, CCC-SLP    Filed 06/08/2019

## 2019-06-08 NOTE — Unmapped (Signed)
Internal Medicine   Progress Note    This note was written with the assistance of [facility]). I attest that I have reviewed the student note and that the components of the history of the present illness, the physical exam, and the assessment and plan documented were performed by me or were performed in my presence by the student where I verified the documentation and performed (or re-performed) the exam and medical decision making I have reviewed the documentation below and agree without correction.     Patient presents with a reported acute change in her mental status this past Monday after having recently transitioned to a memory care center the Friday before.  Lumbar puncture pursued as an outpatient but unsuccessful due to anatomy.  Treated empirically with broad-spectrum antibiotics given history of recurrent UTI.  Patient unable to really provide [place] with symptoms of urinary tract infection but exam unremarkable and is without fever, other vital signs changes or leukocytosis.  Antibiotics stopped this morning and urine culture later this evening growing 10-50 K Pseudomonas.  Suspect this is colonization.  Low suspicion for CNS infection although she did recently have facial zoster eruption the likelihood of persistent viral meningitis/encephalitis is extremely low.  Also no concern for bacterial CNS infection.  Polypharmacy is a possible contributor.  Carbamazepine had been recently reinitiated at her care center but feel this is unlikely the cause of her presentation.  Did obtain some additional collateral from family who commented that baseline she says strange/weird things but is able to participate somewhat in conversation.  This is similar to our evaluation of her at this morning after IV fluid resuscitation.  Ultimately, the cause of her acute decline over the last several months is unclear as she was reported to be relatively independent and living alone in fall 2020.  However, her presentation this hospitalization is likely due to delirium, possibly due to change in environment, possible dehydration etc.  Will speak with family tomorrow as long as patient remains clinically stable about transition back to her care center.    Hayley Wagner L. Hilda Blades, MD  [facility] Internal Medicine PGY3     Patient: Hayley Wagner  Inpatient Service: MedL    ASSESSMENT & PLAN      Problem List:    Principal Problem:    Delirium  Active Problems:    Primary myelofibrosis (CMS-HCC)    Altered mental status    Post herpetic neuralgia    Advanced age    Macrocytic anemia  Resolved Problems:    UTI (urinary tract infection)    A/P:  Hayley Wagner is a 86 y.o. female with hx of vascular dementia, myelofibrosis, postherpetic neuralgia, HTN, HLD, CAD, and TAA, multiple recent admissions for AMS that presented to [facility] with acute on chronic altered mental status.     Per chart review, this is the patient's 4th hospital admission for AMS/delirium in past 2.5 months (2/5-12/21, 2/25-04/28/19 & 3/22-27/21). Per conversation with daughter in law, has been persistently delirious over past 3+ months. Delirium attributed to UTI/AKI, COVID vaccine reaction/polypharmacy, and UTI/AKI respectively. She lives at [facility], and her delirium and increased care needs prompted transition to memory care unit 5 days prior to presentation. Was recently diagnosed with vascular dementia in 3/21 per chart review.     Delirum: Multifactorial. Uncontrolled pain, polypharmacy, changes in environment, low PO intake all potentially contributing. After most recent admission, centrally acting medications (carbamazepime, clonazepam, oxycodone) were discontinued on discharge with some improvement in mental  status, but it appears carbamazepine was restarted per SNF MAR and Lyrica also increased to 75mg  from discharge dose of 50mg  nightly. Non-toxic appearance on exam, no s/s pointing to clear infectious etiology, nl CXR, and duration of symptoms point away from infectious etiology. UA with WBCs, LE but no bacteria. In asymptomatic patient, very low concern for UTI, will discontinue abx. Viral meningitis/encephalitis on differential in setting of her myelofibrosis and previous use of ruxolitinib, but unlikely, would expect patient to be more toxic appearing. LP attempted on 4/8 but was unsuccessful due to degenerative spinal disease. MRI w/o contrast on 2/26 performed during previous admission unremarkable with exception of small vessel ischemic changes c/w previous diagnosis of Vascular Dementia. No metabolic abnormalities on BMP to explain AMS. S/p 2L IVF with slight improvement in symptoms, suggesting dehydration 2/2 low PO intake as a possible contributor.   -- Hold carbamazepine. Limit other centrally acting medications. Will continue lyrica as below given ongoing pain which may also be contributing to delirium.  -- Discontinue vanc/cefepime  -- Delirium precautions  ??  Postherpetic neuralgia. Chronic issue. Grabs R side of face frequently.   - Continue valacyclovir 500 mg daily  - Schedule tylenol 1000 mg TID  - Lidocaine cream to affected area TID  - Duloxetine 20 mg daily and pregabalin 75 mg nightly  ??  Primary myelofibrosis:??Diagnosed in 04/2018. Follows with Dr. [name] [facility] Hem/Onc). Initially treated with hydroxyurea but then transitioned to ruxolitinib in 12/2018. Ruxolitinib has been held.     Daily Checklist:  Diet: Regular Diet  DVT PPx: Lovenox 30mg  q24h  Electrolytes: Replete as needed  Code Status: DNR and DNI  Dispo: Floor     Runner, broadcasting/film/video, MS4  MedL - Pager [phone]    SUBJECTIVE AND O/N EVENTS   No acute events overnight. Subjectively, no complaints. Feels great.     OBJECTIVE   Temp:  [36.5 ??C-37.1 ??C] 37.1 ??C  Heart Rate:  [64-98] 88  Resp:  [15-20] 16  BP: (94-129)/(51-90) 123/67  SpO2:  [88 %-99 %] 96 %    Gen: Elderly woman in NAD, intermittently waxes in attention and alertness. Pulls at things on bed throughout exam. Not oriented to place, time, though chooses correct year (2021) and type of place (hospital) when given options. Unable to provide correct first name but does state last name correctly. Does not answer questions appropriately. Mumbles fluently but nonsensical phrases.   HEENT: atraumatic, sclera anicteric, MMM. OP w/o erythema or exudate   Heart: RRR, S1, S2, no M/R/G.   Lungs: Lungs clear to auscultation bilaterally. No crackles or wheezes. No increased WOB.   Abdomen: Normoactive bowel sounds, soft, NTND, no rebound/guarding  Extremities: no clubbing, cyanosis, or edema  Psych: Pleasant, laughs intermittently     Labs/Studies: Labs and Studies from the last 24hrs per EMR and Reviewed

## 2019-06-08 NOTE — Unmapped (Signed)
Bed: G142  Expected date:   Expected time:   Means of arrival:   Comments:  For room 3

## 2019-06-08 NOTE — Unmapped (Addendum)
Hayley Wagner is a 86 y.o. female with hx of CAD, HTN, HLD, vascular dementia, myelofibrosis, postherpetic neuralgia, multiple recent admissions for AMS that presented to [facility] with acute on chronic altered mental status. Of note, this is the patient's 4th hospital admission for AMS/delirium in past 2.5 months (2/5-12/21, 2/25-04/28/19 & 3/22-27/21). Her hospital course is presented by problem as follows.     Persistent Delirium - Dementia: Patient presented to ED after an episode of hypoactive delirium while at a clinic appointment, and she remained delirious (hyper and hypoactive) throughout hospital stay. Non-toxic appearance on exam with no s/s pointing to clear infectious etiology. Infectious workup (CXR, UA, UCx) unrevealing, and metabolic workup negative. LP attempted had been attempted as outpatient immediately prior to admission as part of outpatient AMS workup but was unsuccessful; however given clinical appearance and duration of symptoms, very low concern for meningitis/encephalitis. Deferred MRI and neurology consult given recent evaluations during previous hospital visits: Neurology had been consulted 04/21/19 for the same presentation; MRI w/o contrast on 04/21/19 unremarkable with exception of small vessel ischemic changes.   Etiology multifactorial: uncontrolled pain, polypharmacy, changes in environment, low PO intake all likely contributing. Per chart review and family history, has had progressive cognitive decline for years with abrupt change in cognitive and functional status a few months ago, in line with recent diagnosis of vascular dementia and increasing her risk of delirium.   - Discontinued carbamazepine  - Continued home lyrica  - Limit other centrally acting medications    Chronic Stable conditions  Postherpetic neuralgia. Chronic issue. Grabs R side of face frequently.??  - Continued home valacyclovir 500 mg daily  - Lidocaine cream to affected area TID  - continued home duloxetine 20 mg daily and pregabalin??75??mg nightly  CAD:  - Continued home aspirin 81mg   Primary myelofibrosis:??Diagnosed in 04/2018. Follows with Dr. [name] [facility] Hem/Onc). Initially treated with hydroxyurea but then transitioned to ruxolitinib in 12/2018. Per chart review, ruxolitinib??held on discharge after recent hospitalization 05/20/19 due to concerns for insomnia.

## 2019-06-08 NOTE — Unmapped (Signed)
Med L History and Physical    Assessment & Plan:  Hayley Wagner is a 86 y.o. female with hx of myelofibrosis not on current tx, postherpetic neuralgia, multiple recent admissions for AMS that presented to [facility] with acute on chronic altered mental status.     Principal Problem:    Altered mental status  Active Problems:    Primary myelofibrosis (CMS-HCC)    UTI (urinary tract infection)    Post herpetic neuralgia  Resolved Problems:    * No resolved hospital problems. *    Acute on chronic altered mental status. 3-4 day hx of mental status change from most recent baseline. Infectious work-up suggests possibility of UTI, though patient without subjective symptoms, fever, or leukocytosis.   In setting of likely progressive vascular dementia, given prior imaging findings. Notably, after recent March admission, centrally acting medications (carbamazepime, clonazepam, oxycodone) were discontinued on discharge with some improvement in mental status, but it appears carbamazepine was restarted per SNF MAR. Lyrica also increased to 75mg  from discharge dose of 50mg  nightly. It seems that treatment of UTI also improved mental status. Other workup has included MRI without contrast that was unremarkable and attempted LP given concern for infectious etiologies in setting of her myelofibrosis and previous use of ruxolitinib.   -- Hold carbamazepine. Limit other centrally acting medications. Will continue lyrica as below given ongoing pain which may also be contributing to delirium.  -- Start vanc/cefepime given prior urine cx growing PsA/E. Faecium. Urine culture pending. Blood cultures also obtained.   -- MRI brain w/wo contrast ordered. Patient may not be able to tolerate without sedation, which we would like to limit in this setting.  -- S/p 2L IVF  -- Delirium precautions    Postherpetic neuralgia. Chronic issue. Grabs R side of face frequently.   - Continue valacyclovir 500 mg daily  - Schedule tylenol 1000 mg TID  - Lidocaine cream to affected area TID  - Duloxetine 20 mg daily and pregabalin 75 mg nightly    Primary myelofibrosis:??Diagnosed in 04/2018. Follows with Dr. [name] [facility] Hem/Onc). Initially treated with hydroxyurea but then transitioned to ruxolitinib in 12/2018. Ruxolitinib has been held.     Daily Checklist:  Diet: Regular Diet  DVT PPx: Lovenox 40mg  q24h   GI PPx: Not Indicated  Code Status: DNR and DNI  Dispo: Admit to Med L floor status    ___________________________________________________________________    Chief Complaint:  Chief Complaint   Patient presents with   ??? Altered Mental Status     Altered mental status    HPI:  Hayley Wagner is a 86 y.o. female with PMHx as reviewed in the EMR that presented to [facility] with Altered mental status.    History obtained from patient's daughter in law, [place]. She reports patient was recently moved into the memory care unit at the facility she was staying at 5 days ago. Family that visited her 5 days ago notes that she was interactive, making normal conversation with some degree of confusion regarding details, and eating/drinking well. At baseline is wheelchair bound, dependent for bathing, stooling, urinating.     Change in mental status was apparent to family that visited 3 days ago, with decreased interaction, however today was a significant change per [place]. She was scheduled to have lumbar puncture done today as per Dr. [name]' recommendation. When Tiana picked her up from the facility, she was lethargic and was somnolent/noninteractive throughout entire attempted procedure without any administered sedating meds. Unfortunately, unable to  successfully obtain CSF due to lumbar spondyloarthropathy/scoliosis. The patient was mumbling nonsensically. Bennie Hind also notes that patient has been keeping her L eye closed for some time, saying that she cannot see out of that eye. At one point, patient opened both eyes and Bennie Hind thinks she saw her L eye looking to the left and R eye looking straight forward. Bennie Hind decided to bring patient to the ED due to concern for acute change in mental status.   Per Tiana's report, the memory care unit denies noticing any change in clinical status, however she was first moved there on 4/9. No report of fevers, change in mental status, urinary changes, complaints of new pain.     This acute change in mental status is in the setting of a significant decline since February 2021 when patient was living independently at home with 2 cats. At that time, she was hospitalized for a UTI c/b delirium followed by facial zoster reactivation c/b postherpetic neuralgia. Per chart review, decline may have begun earlier dating back to at least Nov 2020 when she was noted to be having some visual hallucinations/inattentiveness. She was admitted to [facility] from 3/22-3/26 where change in mental status was attributed to polypharmacy as well as possible UTI. After this admission, she was noted to have some improvement, but did not return to prior baseline. She was seen by Dr. [name] on 4/5 and, given concern for MF w/ ruxolitinib putting patient at increased risk of infection, recommendation was for fluoroscopy guided LP.    Upon arrival to the ED, patient was afebrile, satting well on RA, BP wnl, HR wnl. She was curled up on stretcher with minimal interaction with questions initially but seems to have had some improvement in interaction after fluids and initial abx. Labwork notable for no leukocytosis, Hgb at baseline, thrombocytosis, BMP relatively unremarkable. Urinalysis notable for large LE, protein, ?182 WBC, 7 RBCs, no bacteria seen - urine cx sent (has grown PsA and E. Faecium in the past). CT head without acute abnormality, but does note small vessel ischemic changes.     Allergies:  Patient has no known allergies.    Medications:   Prior to Admission medications    Medication Dose, Route, Frequency   carBAMazepine (TEGRETOL) 100 mg chewable tablet 200 mg, Oral, 2 times a day (standard)   acetaminophen (TYLENOL) 500 MG tablet 1,000 mg, Oral, Every 8 hours   ascorbic acid, vitamin C, (VITAMIN C) 250 MG tablet 250 mg, Oral, Daily (standard)   aspirin 81 MG chewable tablet 81 mg, Oral, Daily (standard)   betamethasone valerate (VALISONE) 0.1 % lotion 1 application, Topical, 2 times a day (standard)   carboxymethylcellulose sodium (THERATEARS) 0.25 % Drop 1 drop, Both Eyes, 6 times a day   cholecalciferol, vitamin D3-50 mcg, 2,000 unit,, 50 mcg (2,000 unit) tablet 1 tablet, Oral, Daily (standard)   DULoxetine (CYMBALTA) 20 MG capsule 20 mg, Oral, Daily (standard)   lidocaine (LMX) 4 % cream Topical, 3 times a day PRN   lidocaine 2% viscous (XYLOCAINE) 2 % Soln 15 mL, Mouth, Every 4 hours PRN   melatonin 3 mg Tab 6 mg, Oral, Every evening, At 6pm   multivitamin (TAB-A-VITE/THERAGRAN) per tablet 1 tablet, Oral, Daily (standard)   polyethylene glycol (MIRALAX) 17 gram packet 17 g, Oral, Daily (standard)   pregabalin (LYRICA) 50 MG capsule 50 mg, Oral, Nightly   RESTASIS MULTIDOSE 0.05 % Drop 1 drop, Both Eyes, Nightly PRN   senna (SENOKOT) 8.6 mg tablet 2 tablets, Oral,  Nightly PRN   simvastatin (ZOCOR) 20 MG tablet TAKE 1 TABLET BY MOUTH ONCE DAILY   valACYclovir (VALTREX) 500 MG tablet 500 mg, Oral, Daily (standard)       Medical History:  Past Medical History:   Diagnosis Date   ??? Anemia    ??? At risk for falls    ??? CAD (coronary artery disease)    ??? Cancer (CMS-HCC)    ??? Chronic kidney disease    ??? GERD (gastroesophageal reflux disease)    ??? Hyperlipidemia    ??? Hypertension        Surgical History:  No past surgical history on file.    Social History:  Social History     Socioeconomic History   ??? Marital status: Widowed     Spouse name: Not on file   ??? Number of children: Not on file   ??? Years of education: Not on file   ??? Highest education level: Not on file   Occupational History   ??? Not on file   Tobacco Use   ??? Smoking status: Former Smoker   ??? Smokeless tobacco: Never Used Vaping Use   ??? Vaping Use: Never used   Substance and Sexual Activity   ??? Alcohol use: Yes   ??? Drug use: Never   ??? Sexual activity: Not on file   Other Topics Concern   ??? Not on file   Social History Narrative   ??? Not on file     Social Determinants of Health     Financial Resource Strain: Low Risk    ??? Difficulty of Paying Living Expenses: Not very hard   Food Insecurity: No Food Insecurity   ??? Worried About Running Out of Food in the Last Year: Never true   ??? Ran Out of Food in the Last Year: Never true   Transportation Needs: No Transportation Needs   ??? Lack of Transportation (Medical): No   ??? Lack of Transportation (Non-Medical): No   Physical Activity:    ??? Days of Exercise per Week:    ??? Minutes of Exercise per Session:    Stress:    ??? Feeling of Stress :    Social Connections: Unknown   ??? Frequency of Communication with Friends and Family: Not on file   ??? Frequency of Social Gatherings with Friends and Family: Not on file   ??? Attends Religious Services: Not on file   ??? Active Member of Clubs or Organizations: Not on file   ??? Attends Club or Organization Meetings: Not on file   ??? Marital Status: Widowed       Family History:  No family history on file.    Review of Systems:  ROS    Labs/Studies:  Labs and Studies from the last 24hrs per EMR and Reviewed    Imaging: Radiology studies were personally reviewed    EKG: pending    Physical Exam:  Temp:  [35.7 ??C-36.6 ??C] 36.6 ??C  Heart Rate:  [60-71] 65  SpO2 Pulse:  [62-63] 62  Resp:  [12-21] 17  BP: (103-143)/(54-90) 128/90  SpO2:  [88 %-100 %] 88 %    Gen: chronically ill appearing, elderly female, NAD  HEENT: sclera anicteric, conjunctiva clear. Difficult to adequately assess L eye due to patient noncompliance, but no apparent dysconjugate gaze and bilateral pupils seem reactive.  Heart: RRR, no murmur appreciated  Lungs: CTAB, no wheezes, rhonci, or rales, normal work of breathing on RA  Abdomen: Normoactive bowel sounds, soft,  NTND  GU: no flank tenderness elicited  Extremities: no BLE edema  Neuro: patient moves all extremities spontaneously but does not follow commands. She is not oriented to self, time, place. At times, speech is coherent without any dysarthria, but then will start mumbling with non coherent speech. Bilateral pupils appear to be reactive, dysconjugate gaze not noted, though patient not compliant with exam and unable to appropriately assess L eye.   Skin:  No rashes, lesions noted on abdomen/back/extremities. Has small lesion near R eye from prior zoster infection

## 2019-06-09 LAB — BASIC METABOLIC PANEL
ANION GAP: 10 mmol/L (ref 7–15)
BLOOD UREA NITROGEN: 15 mg/dL (ref 7–21)
BUN / CREAT RATIO: 25
CHLORIDE: 104 mmol/L (ref 98–107)
CO2: 21 mmol/L — ABNORMAL LOW (ref 22.0–30.0)
CREATININE: 0.59 mg/dL — ABNORMAL LOW (ref 0.60–1.00)
EGFR CKD-EPI AA FEMALE: >90 mL/min/{1.73_m2} (ref >=60)
GLUCOSE RANDOM: 109 mg/dL (ref 70–179)
SODIUM: 135 mmol/L (ref 135–145)

## 2019-06-09 LAB — SODIUM: Sodium:SCnc:Pt:Ser/Plas:Qn:: 135

## 2019-06-09 MED ORDER — ONDANSETRON 4 MG DISINTEGRATING TABLET
Freq: Three times a day (TID) | ORAL | 0 refills | 0 days | PRN
Start: 2019-06-09 — End: 2019-06-16

## 2019-06-09 NOTE — Unmapped (Signed)
Received patient this afternoon as a new admit, patient remains confused, only oriented to self. Remains on 1:1 for safety. Pt continues to have poor po intake, increased po encouraged. Remains without complaint of pain. Pt without other significant event this shift.      Problem: Skin Injury Risk Increased  Goal: Skin Health and Integrity  Outcome: Ongoing - Unchanged     Problem: Adult Inpatient Plan of Care  Goal: Plan of Care Review  Outcome: Ongoing - Unchanged  Goal: Patient-Specific Goal (Individualization)  Outcome: Ongoing - Unchanged  Flowsheets (Taken 06/08/2019 1808)  Patient-Specific Goals (Include Timeframe): pt will remain free of harm this shift  Individualized Care Needs: new admit, falls precautions, 1:1  Anxieties, Fears or Concerns: unable to express  Goal: Absence of Hospital-Acquired Illness or Injury  Outcome: Ongoing - Unchanged  Goal: Optimal Comfort and Wellbeing  Outcome: Ongoing - Unchanged  Goal: Readiness for Transition of Care  Outcome: Ongoing - Unchanged  Goal: Rounds/Family Conference  Outcome: Ongoing - Unchanged     Problem: Wound  Goal: Optimal Wound Healing  Outcome: Ongoing - Unchanged     Problem: Fall Injury Risk  Goal: Absence of Fall and Fall-Related Injury  Outcome: Ongoing - Unchanged     Problem: Perinatal Fall Injury Risk  Goal: Absence of Fall, Infant Drop and Related Injury  Outcome: Ongoing - Unchanged     Problem: Self-Care Deficit  Goal: Improved Ability to Complete Activities of Daily Living  Outcome: Ongoing - Unchanged

## 2019-06-09 NOTE — Unmapped (Signed)
RN. Patient calm and cooperative this shift. Sitter at bedside for safety. Patient tolerating PO intake. Incontinent of bowel and bladder. Not observed OOB. Patient with soft BP while waking from sleep. No changes in mentation/reports of lightheadedness at that time. Rechecked when patient more awake and returned to baseline. MD team aware. Patient being discharged back to [facility]. Report called to Clayborn Bigness, Charity fundraiser. Awaiting transport.     Problem: Skin Injury Risk Increased  Goal: Skin Health and Integrity  Outcome: Resolved     Problem: Adult Inpatient Plan of Care  Goal: Plan of Care Review  Outcome: Resolved  Goal: Patient-Specific Goal (Individualization)  Outcome: Resolved  Goal: Absence of Hospital-Acquired Illness or Injury  Outcome: Resolved  Goal: Optimal Comfort and Wellbeing  Outcome: Resolved  Goal: Readiness for Transition of Care  Outcome: Resolved  Goal: Rounds/Family Conference  Outcome: Resolved     Problem: Wound  Goal: Optimal Wound Healing  Outcome: Resolved     Problem: Fall Injury Risk  Goal: Absence of Fall and Fall-Related Injury  Outcome: Resolved     Problem: Perinatal Fall Injury Risk  Goal: Absence of Fall, Infant Drop and Related Injury  Outcome: Resolved     Problem: Self-Care Deficit  Goal: Improved Ability to Complete Activities of Daily Living  Outcome: Resolved

## 2019-06-09 NOTE — Unmapped (Signed)
This patient was not seen in person. The clinical nutrition service has moved to a liaison model to minimize potential spread of COVID-19 and protect patients/providers.  During this time, we will be limiting person-to-person contact when possible.    Adult Nutrition Assessment Note    Visit Type: RN Consult  Reason for Visit: Per Admission Nutrition Screen (Adult), Have you gained or lost 10 pounds in the past 3 months?, Have you had a decrease in food intake or appetite?    ASSESSMENT:   HPI & PMH: Hayley Wagner is a 86 y.o. female with hx of myelofibrosis not on current tx, postherpetic neuralgia, multiple recent admissions for AMS that presented to [facility] with acute on chronic altered mental status.     Nutrition Hx: Patient currently only oriented to name, unable to provide history at this time. SLP evaluated yesterday, safe for regular diet. 50% intake x 1 meal documented yesterday.     Nutritionally Pertinent Meds: Vitamin C, Cholecalciferol, MVI w/ minerals, Miralax, Senna  Labs: Reviewed   Abd/GI: Last BM 04/15   Skin:   Patient Lines/Drains/Airways Status    Active Wounds     None               Current nutrition therapy order:   Nutrition Orders          Nutrition Therapy Regular/House starting at 04/14 2305           Anthropometric Data:  -- Height:     -- Last recorded weight: 48.5 kg (106 lb 14.8 oz)  -- Admission weight: 48.5 kg (106 lb 14.8 oz)  -- IBW:    -- Percent IBW:    -- BMI: Body mass index is 23.14 kg/m??.   -- Weight changes this admission:   Last 5 Recorded Weights    06/08/19 1700   Weight: 48.5 kg (106 lb 14.8 oz)      -- Weight history PTA: 18 lb weight loss (15%) x 2 months - severe   Wt Readings from Last 10 Encounters:   06/08/19 48.5 kg (106 lb 14.8 oz)   05/29/19 48.7 kg (107 lb 4.8 oz)   05/16/19 48.7 kg (107 lb 5.8 oz)   05/09/19 50 kg (110 lb 5.4 oz)   04/22/19 51.3 kg (113 lb)   03/31/19 56.5 kg (124 lb 9.6 oz)   03/20/19 59 kg (130 lb 1.1 oz)   02/28/19 58.4 kg (128 lb 13.7 oz)   12/26/18 58.5 kg (128 lb 14.4 oz)   07/15/17 64 kg (141 lb)        Daily Estimated Nutrient Needs:   Energy: 1225-1470 kcals [25-30 kcal/kg using admission body weight, 49 kg (06/09/19 0948)]  Protein: 49-59 gm [1.0-1.2 gm/kg using admission body weight, 49 kg (06/09/19 0948)]  Carbohydrate:   [no restriction]  Fluid:   [1 mL/kcal (maintenance)]     Nutrition Focused Physical Exam: Unable to complete           DIAGNOSIS:  Malnutrition Assessment using AND/ASPEN Clinical Characteristics:  Pending history, NFPE                           Overall nutrition impression: Patient with minimal intakes documented, will send high calorie supplements to aid in meeting nutrient needs.      GOALS:  Oral Intake:       - Patient to consume 50% of 3 meals per day.     RECOMMENDATIONS AND  INTERVENTIONS:  - Continue current nutrition therapy, encourage PO  - Ensure Plus TID  - Nursing to assist with ordering meals, set up and feeding as able  - Continue Vitamin/Mineral supplementation as ordered  - Consider check Vitamin D and adjust repletion as indicated  - Weekly weights       RD Follow Up Parameters:  1-2 times per week (and more frequent as indicated)     Lavella Lemons, MS, RD, LDN  Pager: [phone]

## 2019-06-09 NOTE — Unmapped (Signed)
Care Management  Initial Transition Planning Assessment  Brief SW referral:             General  Care Manager assessed the patient by : In person interview with family  Orientation Level: Disoriented to person, Disoriented to place, Disoriented to time, Disoriented to situation  Functional level prior to admission: Partially Assisted  Level of assistance required: Walking, Transferring, Grooming  Reason for referral: Discharge Planning, Transportation, Placement    Contact/Decision Maker  Extended Emergency Contact Information  Primary Emergency Contact: Remer Macho  Mobile Phone: [phone]  Relation: Daughter in [place]  Preferred language: ENGLISH  Interpreter needed? No  Secondary Emergency Contact: Karson, Chicas  Mobile Phone: [phone]  Relation: Son    Legal Next of Kin / Guardian / POA / Advance Directives       Advance Directive (Medical Treatment)  Does patient have an advance directive covering medical treatment?: Unable to assess (Pt. cognitively impaired, and/or unaccompanied).    Health Care Decision Maker [HCDM] (Medical & Mental Health Treatment)  Healthcare Decision Maker: Patient needs follow-up to appoint a Health Care Decision Maker.  Information offered on HCDM, Medical & Mental Health advance directives:: Patient given information.      CM initial assessment completed telephonically as a precautionary measure in accordance with [facility] COVID-19 Pandemic emergency response plan. Patient representative pts daughter in law verbalized understanding and agreement with completing this assessment by telephone.       Patient Information  Lives with: Other (Comment) (snf)    Type of Residence: ALF (Assisted living facility)  Facility (Name/Phone #): [facility] [phone]  Return to facility?: Yes   pt's address at Memory Care unit is:  [address] [place] [address], [place], [place] [address]    Support Systems/Concerns: Children, Family Members Per daughter in law, pt has 3 sons, one son Jonny Ruiz who is HCPOA lives in [place], one son Tacey Ruiz lives in [place] and out of town a lot, one son Theron Arista lives in [place]    Responsibilities/Dependents at home?: N/A    Home Care services in place prior to admission?: No   Equipment Currently Used at Home: wheelchair, manual       Currently receiving outpatient dialysis?: No       Financial Information   Pt receives [employer] retirement     Need for financial assistance?: No       Social Determinants of Health  Social Determinants of Health were addressed in provider documentation.  Please refer to patient history. NA    Discharge Needs Assessment  Concerns to be Addressed: care coordination/care conferences, discharge planning, adjustment to diagnosis/illness Per daughter in law, pt was living independently then she began having increased confusion and falling a lot. She was admitted at [facility] in [facility] early Feb and was there 4 wks. Family then decided pt could not longer live safely alone and admitted her to [facility] in [place] in their Memory Care unit, pt has been there approx one wk. Daughter stated pt uses her wheel chair to assist with mobility. SW attempted to reach [facility], however only could leave VM will try again. Daughter told SW she wanted to speak to MD before pt leaves. SW informed MD pt's daughter was requesting a call. Later, SW was informed by MD, pt was medically cleared to return to her Memory Care unit.   SW contacted daughter and updated her, daughter was agreeable with plan, she requested BLS be timed around 3:30 pm and  she will be at ALF by 4:00 pm with pt's wheel chair.     SW completed a BLS TRIP form and tasked CMA to arrange for BLS for 3:30 pm today. SW prepared a SNF packet and provided to nurse along with update.    Altered mental status, unspecified altered mental status type [R41.82]    Clinical Risk Factors: Multiple Diagnoses (Chronic), Functional Limitations, History of Falls, Readmission < 30 Days    Barriers to taking medications: No    Prior overnight hospital stay or ED visit in last 90 days: Yes    Readmission Within the Last 30 Days: current reason for admission unrelated to previous admission         Anticipated Changes Related to Illness: inability to care for self    Equipment Needed After Discharge: none    Discharge Facility/Level of Care Needs: assisted living facility    Readmission  Risk of Unplanned Readmission Score: UNPLANNED READMISSION SCORE: 24%  Predictive Model Details          24% (High)  Factor Value    Calculated 06/09/2019 12:03 22% Number of active Rx orders 37    [place] Risk of Unplanned Readmission Model 19% Number of ED visits in last six months 4     12% Number of hospitalizations in last year 3     8% ECG/EKG order present in last 6 months     8% Latest calcium low (8.3 mg/dL)     6% Age 86     6% Imaging order present in last 6 months     5% Latest hemoglobin low (9.2 g/dL)     4% Diagnosis of deficiency anemia present     4% Active anticoagulant Rx order present     4% Active corticosteroid Rx order present     2% Future appointment scheduled     1% Current length of stay 1.672 days     1% Charlson Comorbidity Index 1      Readmitted Within the Last 30 Days? (No if blank) Yes  Patient at risk for readmission?: Yes    Discharge Plan  Screen findings are: Discharge planning needs identified or anticipated (Comment).    Expected Discharge Date:     Expected Transfer from Critical Care: 06/09/19    Quality data for continuing care services shared with patient and/or representative?: Yes  Patient and/or family were provided with choice of facilities / services that are available and appropriate to meet post hospital care needs?: Yes       Initial Assessment complete?: Yes    Festus Barren, MSW  Page [phone]

## 2019-06-09 NOTE — Unmapped (Signed)
Pt oriented to name only.  Per previous sitter, she had not slept much at all, but this shift she is resting well.  Pt moved to room (1207) which is closer for [place] to view from nurse station.  When told she would be moving she states I think that's a good idea.    Bed alarm in place as sitter left at 2300.  No events thus far.  Continue to check for incontinence and help with repositioning.   Scheduled tylenol given per order.  VSS.  Will continue to monitor.    Problem: Skin Injury Risk Increased  Goal: Skin Health and Integrity  Outcome: Progressing     Problem: Adult Inpatient Plan of Care  Goal: Plan of Care Review  Outcome: Progressing  Goal: Patient-Specific Goal (Individualization)  Outcome: Progressing  Goal: Absence of Hospital-Acquired Illness or Injury  Outcome: Progressing  Goal: Optimal Comfort and Wellbeing  Outcome: Progressing  Goal: Readiness for Transition of Care  Outcome: Progressing  Goal: Rounds/Family Conference  Outcome: Progressing     Problem: Wound  Goal: Optimal Wound Healing  Outcome: Progressing     Problem: Fall Injury Risk  Goal: Absence of Fall and Fall-Related Injury  Outcome: Progressing     Problem: Perinatal Fall Injury Risk  Goal: Absence of Fall, Infant Drop and Related Injury  Outcome: Progressing     Problem: Self-Care Deficit  Goal: Improved Ability to Complete Activities of Daily Living  Outcome: Progressing

## 2019-06-09 NOTE — Unmapped (Signed)
Physician Discharge Summary [facility]  [address] [facility]  [address]  [place] [address]  Dept: [phone]  Loc: [phone]     Identifying Information:   Hayley Wagner  26-Dec-1928  [id number]    Primary Care Physician: Caryl Asp, FNP   Code Status: DNR and DNI    Admit Date: 06/07/2019    Discharge Date: 06/09/2019     Discharge To: Skilled nursing facility    Discharge Service: [facility] - General Medicine Floor Team (MEDL)     Discharge Attending Physician: Donetta Potts, MD    Discharge Diagnoses:  Principal Problem:    Delirium POA: Unknown  Active Problems:    Primary myelofibrosis (CMS-HCC) POA: Yes    Altered mental status POA: Yes    Post herpetic neuralgia POA: Yes    Advanced age POA: Unknown    Macrocytic anemia POA: Unknown  Resolved Problems:    UTI (urinary tract infection) POA: Yes    Outpatient Provider Follow Up Issues:   [  ] Follow up with PCP for medication adjustments as needed to control pain/prevent delirium     Hospital Course:   Hayley Wagner is a 86 y.o. female with hx of CAD, HTN, HLD, vascular dementia, myelofibrosis, postherpetic neuralgia, multiple recent admissions for AMS that presented to [facility] with acute on chronic altered mental status. Of note, this is the patient's 4th hospital admission for AMS/delirium in past 2.5 months (2/5-12/21, 2/25-04/28/19 & 3/22-27/21). Her hospital course is presented by problem as follows.     Persistent Delirium - Dementia: Patient presented to ED after an episode of hypoactive delirium while at a clinic appointment, and she remained delirious (hyper and hypoactive) throughout hospital stay. Non-toxic appearance on exam with no s/s pointing to clear infectious etiology. Infectious workup (CXR, UA, UCx) unrevealing, and metabolic workup negative. LP attempted had been attempted as outpatient immediately prior to admission as part of outpatient AMS workup but was unsuccessful; however given clinical appearance and duration of symptoms, very low concern for meningitis/encephalitis. Deferred MRI and neurology consult given recent evaluations during previous hospital visits: Neurology had been consulted 04/21/19 for the same presentation; MRI w/o contrast on 04/21/19 unremarkable with exception of small vessel ischemic changes.   Etiology multifactorial: uncontrolled pain, polypharmacy, changes in environment, low PO intake all likely contributing. Per chart review and family history, has had progressive cognitive decline for years with abrupt change in cognitive and functional status a few months ago, in line with recent diagnosis of vascular dementia and increasing her risk of delirium.   - Discontinued carbamazepine  - Continued home lyrica  - Limit other centrally acting medications    Chronic Stable conditions  Postherpetic neuralgia. Chronic issue. Grabs R side of face frequently.??  - Continued home valacyclovir 500 mg daily  - Lidocaine cream to affected area TID  - continued home duloxetine 20 mg daily and pregabalin??75??mg nightly  CAD:  - Continued home aspirin 81mg   Primary myelofibrosis:??Diagnosed in 04/2018. Follows with Dr. [name] [facility] Hem/Onc). Initially treated with hydroxyurea but then transitioned to ruxolitinib in 12/2018. Per chart review, ruxolitinib??held on discharge after recent hospitalization 05/20/19 due to concerns for insomnia.       Procedures:  No admission procedures for hospital encounter.  __________________________________________________________________    Discharge Medications:     Your Medication List      STOP taking these medications    carBAMazepine 100 mg chewable tablet  Commonly known as: TEGretol  CHANGE how you take these medications    pregabalin 75 MG capsule  Commonly known as: LYRICA  Take 75 mg by mouth nightly.  What changed: Another medication with the same name was removed. Continue taking this medication, and follow the directions you see here.        CONTINUE taking these medications    acetaminophen 500 MG tablet  Commonly known as: TYLENOL  Take 1,000 mg by mouth every eight (8) hours.     aluminum-magnesium hydroxide-simethicone 400-400-40 mg/5 mL suspension  Commonly known as: MAALOX MAX  Take 30 mL by mouth every four (4) hours as needed (indigestion).     aspirin 81 MG chewable tablet  Chew 81 mg daily.     betamethasone valerate 0.1 % lotion  Commonly known as: VALISONE  Apply 1 application topically Two (2) times a day.     carboxymethylcellulose sodium 0.25 % Drop  Commonly known as: THERATEARS  Administer 1 drop to both eyes Six (6) times a day.     cholecalciferol (vitamin D3-50 mcg (2,000 unit)) 50 mcg (2,000 unit) tablet  Take 1 tablet by mouth daily.     DULoxetine 20 MG capsule  Commonly known as: CYMBALTA  Take 1 capsule (20 mg total) by mouth daily.     lidocaine 2% viscous 2 % Soln  Commonly known as: XYLOCAINE  15 mL by Mouth route every four (4) hours as needed (for mouth pain prior to eating).     lidocaine 4 % cream  Commonly known as: LMX  Apply topically Three (3) times a day as needed (Apply to face for face pain).     melatonin 3 mg Tab  Take 3 mg by mouth nightly.     multivitamin per tablet  Commonly known as: TAB-A-VITE/THERAGRAN  Take 1 tablet by mouth daily.     ondansetron 4 MG disintegrating tablet  Commonly known as: ZOFRAN-ODT  Take 1 tablet (4 mg total) by mouth every eight (8) hours as needed for up to 7 days.     polyethylene glycol 17 gram packet  Commonly known as: MIRALAX  Take 17 g by mouth daily.     RESTASIS MULTIDOSE 0.05 % Drop  Generic drug: cycloSPORINE  Administer 1 drop to both eyes nightly as needed.     senna 8.6 mg tablet  Commonly known as: SENOKOT  Take 2 tablets by mouth nightly as needed for constipation.     simvastatin 20 MG tablet  Commonly known as: ZOCOR  Take 20 mg by mouth nightly.     valACYclovir 500 MG tablet  Commonly known as: VALTREX  Take 1 tablet (500 mg total) by mouth daily.     vitamin C 250 MG tablet Generic drug: ascorbic acid (vitamin C)  Take 250 mg by mouth daily.          Allergies:  Patient has no known allergies.  __________________________________________________________________    Pending Test Results (if blank, then none):  Pending Labs     Order Current Status    Blood Culture #1 Preliminary result    Blood Culture #2 Preliminary result        Most Recent Labs:  All lab results last 24 hours -   Recent Results (from the past 24 hour(s))   Basic Metabolic Panel    Collection Time: 06/09/19 11:09 AM   Result Value Ref Range    Sodium 135 135 - 145 mmol/L    Potassium 4.1 3.5 - 5.0 mmol/L  Chloride 104 98 - 107 mmol/L    CO2 21.0 (L) 22.0 - 30.0 mmol/L    Anion Gap 10 7 - 15 mmol/L    BUN 15 7 - 21 mg/dL    Creatinine 1.91 (L) 0.60 - 1.00 mg/dL    BUN/Creatinine Ratio 25     EGFR CKD-EPI Non-African American, Female 43 >=60 mL/min/1.50m2    EGFR CKD-EPI African American, Female >90 >=60 mL/min/1.7m2    Glucose 109 70 - 179 mg/dL    Calcium 8.1 (L) 8.5 - 10.2 mg/dL     CBC -   No results found for requested labs within last 2 days.     Relevant Studies/Radiology (if blank, then none):  ECG 12 Lead    Result Date: 06/08/2019  NORMAL SINUS RHYTHM LOW VOLTAGE QRS NONSPECIFIC T WAVE ABNORMALITY WHEN COMPARED WITH ECG OF 15-May-2019 18:08, PREMATURE VENTRICULAR BEATS ARE NO LONGER PRESENT Confirmed by Lorretta Harp [phone]) on 06/08/2019 8:30:33 PM    XR Chest Portable    Result Date: 06/07/2019  EXAM: XR CHEST PORTABLE DATE: 06/07/2019 2:40 PM ACCESSION: [id number] UN DICTATED: 06/07/2019 2:45 PM INTERPRETATION LOCATION: [facility] CLINICAL INDICATION: 86 years old Female with ALTERED MENTAL STATUS  COMPARISON: 05/15/2019 TECHNIQUE: Portable Chest Radiograph. FINDINGS: Study is technically limited due to rotation. No obvious consolidation. Trace right pleural effusion. No pneumothorax The cardiomediastinal silhouette is poorly visualized due to patient rotation.     No acute airspace disease allowing for limited study.    CT Head Wo Contrast    Result Date: 06/07/2019  EXAM: Computed tomography, head or brain without contrast material. DATE: 06/07/2019 4:44 PM ACCESSION: [id number] UN DICTATED: 06/07/2019 5:13 PM INTERPRETATION LOCATION: [facility] CLINICAL INDICATION: 86 years old Female with altered mental status ; Mental status change, unknown cause  COMPARISON: 04/21/2019 MRI brain and 04/20/2019 CT head TECHNIQUE: Axial CT images of the head  from skull base to vertex without contrast. FINDINGS: Similar global cerebral atrophy. There is no midline shift. No mass lesion. There is no evidence of acute infarct. No acute intracranial hemorrhage. There are scattered and confluent hypodense foci within the periventricular and deep white matter.  These are nonspecific but commonly associated with small vessel ischemic changes. No fractures are evident. The sinuses are pneumatized. Carotid siphon calcifications.     No acute intracranial abnormality.    FL Lumbar Puncture - Radiology    Result Date: 06/07/2019  EXAM: Fluoroscopic guidance and localization of needle or catheter tip for spine or paraspinous diagnostic or therapeutic injection procedures. 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 Abdominal radiograph 05/16/2019. FLUOROSCOPY TIME: 2.1 minutes CONSENT: The potential risks and benefits of the procedure were discussed and all questions were answered and written and verbal consent was obtained and documented by Dr. Cliffton Asters PROCEDURE: Initial fluoroscopic images demonstrate lumbar dextroscoliosis with marked degenerative disc disease and facet arthropathy of the lumbar spine, which contributed to the overall difficulty of the case. The patient was placed in a prone oblique position on the fluoroscopy table, and the lower back was prepped and draped in the usual sterile fashion.  Local anesthesia was provided at the selected puncture site using approximately 4 mL of 1% buffered Lidocaine.  Using sterile technique and intermittent fluoroscopic guidance, a 20-gauge, 3.5 inch spinal needle was  attempted to be introduced into the thecal sac at the L3-4 level.  There was not spontaneous return of clear CSF. The stylet was replaced and the needle removed. Subsequently, the L4-L5 level was attempted using similar sterile technique and similar 3.5 inch spinal needle. There was not spontaneous return of clear CSF. A bandaid was placed at the puncture site. No immediate complications were evident.  ATTESTATION: Dr. Raynelle Chary was present for and participated in the critical portions, and immediately available for the remainder of this procedure.     Unsuccessful lumbar puncture using fluoroscopic guidance.    __________________________________________________________________  Discharge Instructions:      You were admitted to the hospital for altered mental status.     Please stop taking carbamazepine. You can continue all other home medications. Please continue to work with your PCP to discuss how to best control your pain and other medical conditions while also avoiding medications that can negatively affect your alertness, sleep, energy, and sleep.     It was truly a pleasure meeting you.     Other Instructions     Discharge instructions      I certify that based on my evaluation of this patient, this patient requires BLS transportation services and that other forms of transport are contraindicated.  Please refer to care management transitions note for transportation details.         I certify that based on my evaluation of this patient, this patient requires BLS transportation services and that other forms of transport are contraindicated.  Please refer to care management transitions note for transportation details.        Resources and Referrals     Memory Care   [facility]  [address]   [place], [place] [address]  [phone]           Follow Up instructions and Outpatient Referrals     Discharge instructions      I certify that based on my evaluation of this patient, this patient requires BLS transportation services and that other forms of transport are contraindicated.  Please refer to care management transitions note for transportation details.        Appointments which have been scheduled for you    Jun 27, 2019  1:00 PM  (Arrive by 12:30 PM)  RETURN TELEPHONE with Vernie Murders, AGNP  [place] [facility]) [address]  [place] [address]  [phone]   You will receive a phone call from your provider at the time of your visit.    If this is an emergency, call 911 or go to the nearest urgent care or emergency room.               __________________________________________________________________    Discharge Day Services:  BP 106/65  - Pulse 96  - Temp 36.6 ??C (97.9 ??F) (Oral)  - Resp 18  - Wt 48.5 kg (106 lb 14.8 oz)  - SpO2 99%  - BMI 23.14 kg/m??   Pt seen on the day of discharge and determined appropriate for discharge.    Condition at Discharge: fair    Length of Discharge: I spent greater than 30 mins in the discharge of this patient.

## 2019-06-09 NOTE — Unmapped (Signed)
Memory Care   [facility]  [address]   [place], [place] [address]  [phone]

## 2019-06-27 ENCOUNTER — Ambulatory Visit: Admit: 2019-06-27 | Payer: MEDICARE | Attending: Adult Health | Primary: Adult Health

## 2019-07-04 NOTE — Unmapped (Signed)
Hi,     Bennie Hind, EC of the patient contacted the Communication [facility] regarding the following:    - Patient passed away on Friday 2019-07-08.    Please contact Tiana at [phone].    Thanks in advance,    Laverna Peace  [facility]   [phone]

## 2019-07-25 DEATH — deceased

## 2020-06-23 IMAGING — CT CT BONE MARROW BIOPSY AND ASPIRATION
2 series · 10 of 14 positions shown, 12 images · non-contrast
Comparison: none

INDICATION: Thrombocytosis and anemia.

[Series 2: i-spiral 5.0 b30f · axial · 0.77mm/px · z∈[-114,-68]mm · 6 of 19 slices shown, 8 images]
[im 3/19  soft-tissue]
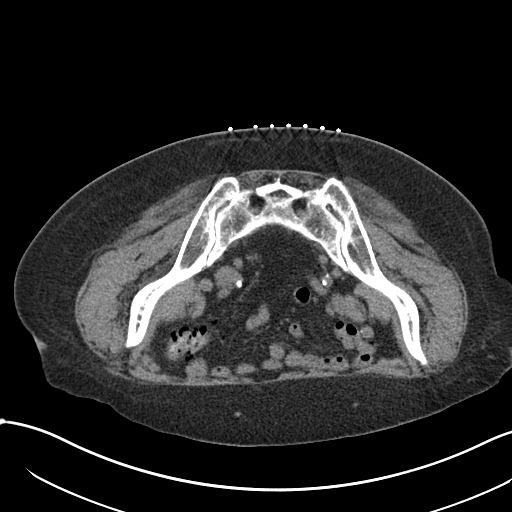
[im 3/19  bone]
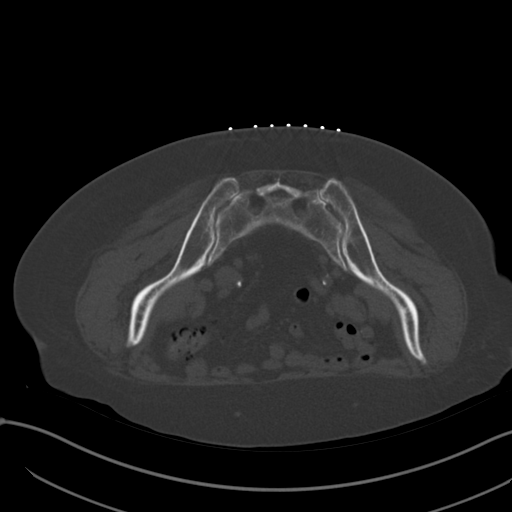
[im 6/19  bone]
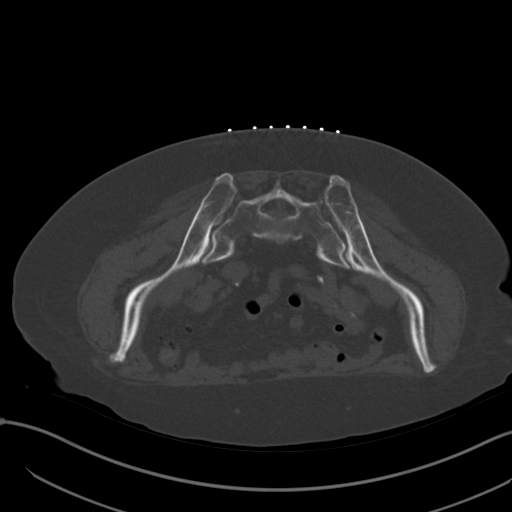
[im 8/19  bone]
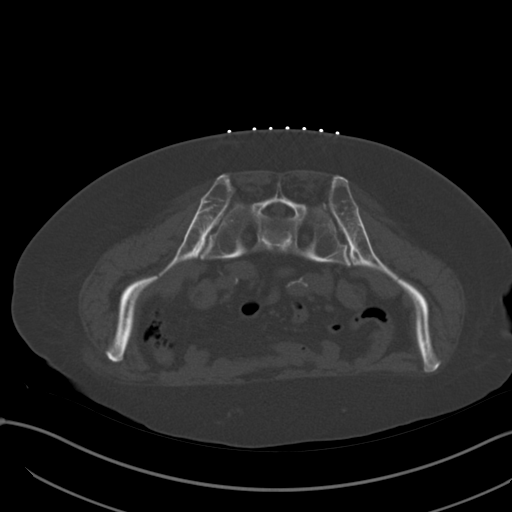
[im 11/19  bone]
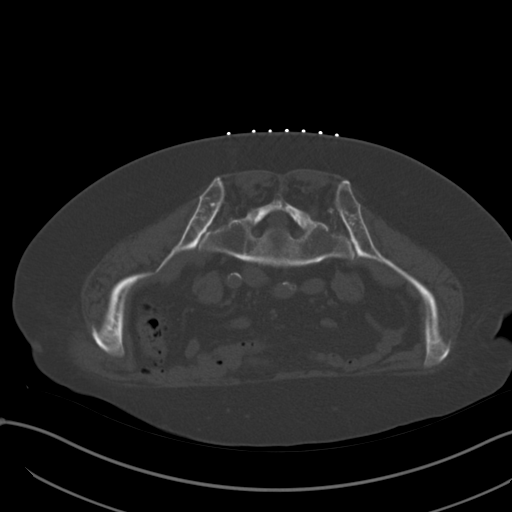
[im 13/19  soft-tissue]
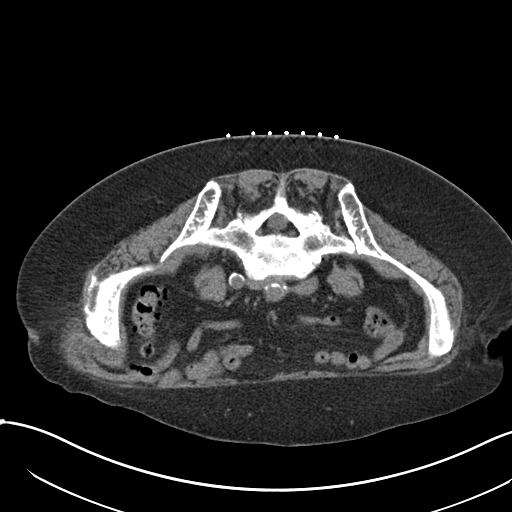
[im 13/19  bone]
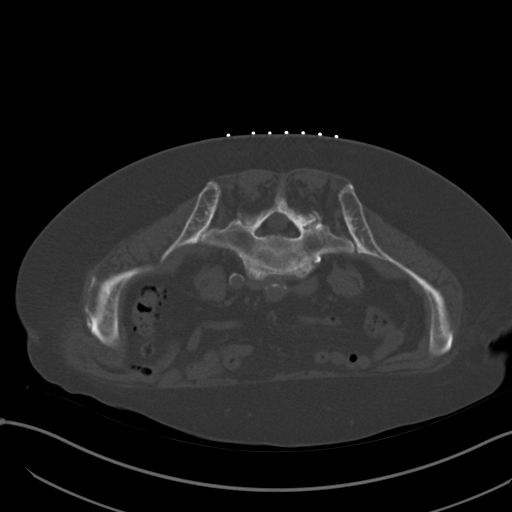
[im 16/19  bone]
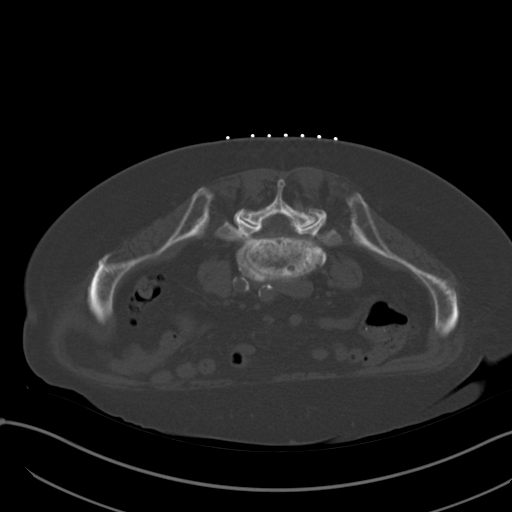

[Series 3: i-sequence 4.8 b30s · axial · 0.77mm/px · z∈[-103,-94]mm · 4 of 15 slices shown]
[im 3/15  bone]
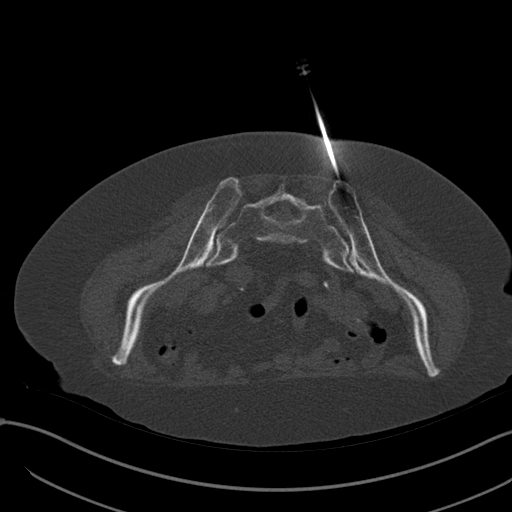
[im 6/15  bone]
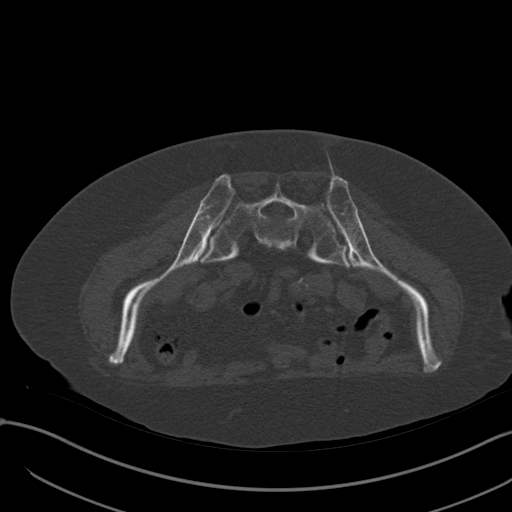
[im 9/15  bone]
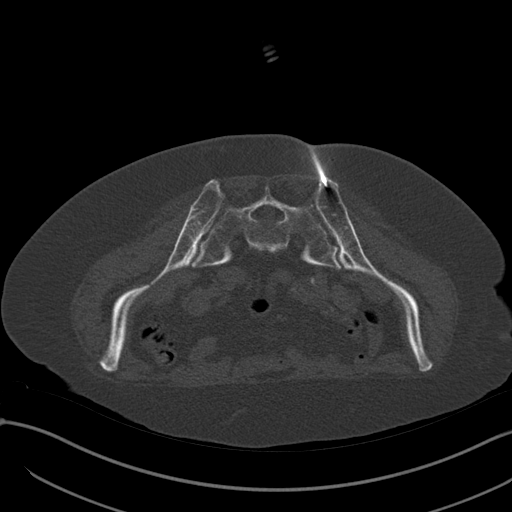
[im 12/15  bone]
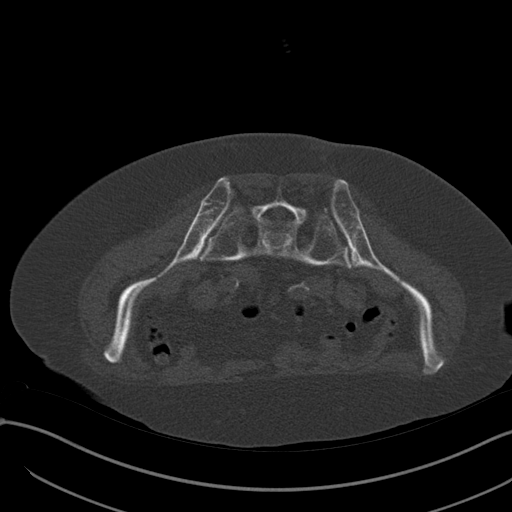

[10 of 14 positions shown; findings below may reference images not displayed]

EXAM:
CT BONE MARROW BIOPSY AND ASPIRATION

MEDICATIONS:
None.

ANESTHESIA/SEDATION:
Fentanyl 75 mcg IV; Versed 3 mg IV

Moderate Sedation Time:  21 minutes

The patient was continuously monitored during the procedure by the
interventional radiology nurse under my direct supervision.

FLUOROSCOPY TIME:  None

COMPLICATIONS:
None immediate.

PROCEDURE:
Informed written consent was obtained from the patient after a
thorough discussion of the procedural risks, benefits and
alternatives. All questions were addressed. Maximal Sterile Barrier
Technique was utilized including caps, mask, sterile gowns, sterile
gloves, sterile drape, hand hygiene and skin antiseptic. A timeout
was performed prior to the initiation of the procedure.

Under CT guidance, a(n) 11 gauge guide needle was advanced into the
right iliac bone via posterior approach. Aspirates and a 2 cores
were obtained. Post biopsy images demonstrate no hemorrhage.

Patient tolerated the procedure well without complication. Vital
sign monitoring by nursing staff during the procedure will continue
as patient is in the special procedures unit for post procedure
observation.
FINDINGS: The images document guide needle placement within the right iliac
bone. Post biopsy images demonstrate no hemorrhage.
IMPRESSION: Successful CT-guided right iliac bone marrow aspirate and core.
